# Patient Record
Sex: Male | Born: 1980
Health system: Southern US, Community
[De-identification: ages and names within clinical notes are randomized; demographics above are authoritative.]

## PROBLEM LIST (undated history)

## (undated) DIAGNOSIS — Z801 Family history of malignant neoplasm of trachea, bronchus and lung: Secondary | ICD-10-CM

## (undated) DIAGNOSIS — R569 Unspecified convulsions: Secondary | ICD-10-CM

## (undated) DIAGNOSIS — C189 Malignant neoplasm of colon, unspecified: Secondary | ICD-10-CM

## (undated) DIAGNOSIS — K4090 Unilateral inguinal hernia, without obstruction or gangrene, not specified as recurrent: Secondary | ICD-10-CM

## (undated) HISTORY — DX: Family history of malignant neoplasm of trachea, bronchus and lung: Z80.1

## (undated) HISTORY — PX: HERNIA REPAIR: SHX51

## (undated) HISTORY — DX: Malignant neoplasm of colon, unspecified: C18.9

## (undated) HISTORY — PX: OTHER SURGICAL HISTORY: SHX169

## (undated) HISTORY — PX: COLON SURGERY: SHX602

---

## 2004-03-01 HISTORY — PX: APPENDECTOMY: SHX54

## 2011-07-30 ENCOUNTER — Ambulatory Visit (INDEPENDENT_AMBULATORY_CARE_PROVIDER_SITE_OTHER): Payer: BC Managed Care – PPO | Admitting: General Surgery

## 2011-07-30 ENCOUNTER — Encounter (INDEPENDENT_AMBULATORY_CARE_PROVIDER_SITE_OTHER): Payer: Self-pay | Admitting: General Surgery

## 2011-07-30 VITALS — BP 138/60 | HR 84 | Temp 97.8°F | Resp 18 | Ht 72.0 in | Wt 195.0 lb

## 2011-07-30 DIAGNOSIS — K4090 Unilateral inguinal hernia, without obstruction or gangrene, not specified as recurrent: Secondary | ICD-10-CM | POA: Insufficient documentation

## 2011-07-30 NOTE — Progress Notes (Signed)
Patient ID: Andres Wallace, male   DOB: 1980-11-13, 31 y.o.   MRN: [id number]  No chief complaint on file.   HPI Andres Wallace is a 31 y.o. male.   HPI  He is referred by Dr. Vernie Ammons for further evaluation and treatment of a right inguinal hernia.  He has been having right groin pain for about a year. His primary care physician told him he had a small inguinal hernia. The pain has persisted and now he is unable to run significant distances because of the discomfort. He saw Dr. Vernie Ammons to arrange a vasectomy and was referred here to discuss repair of the right and one hernia at the same time. No constipation. No difficulty with urination. No chronic cough. No family history of hernia.  History reviewed. No pertinent past medical history.  Past Surgical History  Procedure Date  . Appendectomy 2006    Family History  Problem Relation Age of Onset  . Cancer Father     lung  . Cancer Paternal Grandfather     lung    Social History History  Substance Use Topics  . Smoking status: Never Smoker   . Smokeless tobacco: Not on file  . Alcohol Use: No    No Known Allergies  No current outpatient prescriptions on file.    Review of Systems Review of Systems  Constitutional: Negative.   Respiratory: Negative.   Cardiovascular: Negative.   Gastrointestinal: Negative.   Genitourinary: Negative for difficulty urinating.    Blood pressure 138/60, pulse 84, temperature 97.8 F (36.6 C), temperature source Temporal, resp. rate 18, height 6' (1.829 m), weight 195 lb (88.451 kg).  Physical Exam Physical Exam  Constitutional: He appears well-developed and well-nourished. No distress.  HENT:  Head: Normocephalic and atraumatic.  Cardiovascular: Normal rate and regular rhythm.   Pulmonary/Chest: Effort normal and breath sounds normal.  Abdominal: Soft. He exhibits no distension and no mass. There is no tenderness.  Genitourinary:       Reducible right inguinal bulge.  No left  inguinal bulge.  Musculoskeletal: Normal range of motion. He exhibits no edema.  Skin: Skin is warm and dry.    Data Reviewed Dr. Margrett Rud note.  Assessment    Symptomatic right inguinal hernia. He also wants to have a vasectomy at the same time.   Plan    I discussed open repair of a right inguinal hernia with mesh. He did have a vasectomy at the same time by Dr. Vernie Ammons.  I have explained the procedure, risks, and aftercare of inguinal hernia repair.  Risks include but are not limited to bleeding, infection, wound problems, anesthesia, recurrence, bladder or intestine injury, urinary retention, testicular dysfunction, chronic pain, mesh problems.  He seems to understand and agrees to proceed.  Will coordinate this with Dr. Vernie Ammons.

## 2011-07-30 NOTE — Patient Instructions (Signed)
Call us if the pain from the hernia becomes debilitating.

## 2011-08-02 ENCOUNTER — Other Ambulatory Visit: Payer: Self-pay | Admitting: Urology

## 2011-08-20 ENCOUNTER — Ambulatory Visit (HOSPITAL_BASED_OUTPATIENT_CLINIC_OR_DEPARTMENT_OTHER): Admission: RE | Admit: 2011-08-20 | Payer: BC Managed Care – PPO | Source: Ambulatory Visit | Admitting: General Surgery

## 2011-10-08 ENCOUNTER — Encounter (HOSPITAL_BASED_OUTPATIENT_CLINIC_OR_DEPARTMENT_OTHER): Payer: Self-pay | Admitting: *Deleted

## 2011-10-13 ENCOUNTER — Encounter (HOSPITAL_BASED_OUTPATIENT_CLINIC_OR_DEPARTMENT_OTHER): Payer: Self-pay | Admitting: *Deleted

## 2011-10-14 NOTE — H&P (Signed)
History of Present Illness         Andres Wallace is a 31 year old male patient who desires vasectomy.he is married and has 3 children. He is in good health with no significant past urologic  He has had prior local anesthetic without adverse reaction and has no history of bleeding diathesis.  He did however tell me that he has seen his primary care physician on 3 different occasions regarding pain in his groin region on the right-hand side. He was told he has a small hernia but no treatment was recommended. It bothers him to the point where he has difficulty engaging in activities such as running.   Past Medical History Problems  1. History of  No Medical Problems  Surgical History Problems  1. History of  Appendectomy  Current Meds 1. No Reported Medications  Allergies Medication  1. No Known Drug Allergies  Family History Problems  1. Family history of  Lung Cancer V16.1  Social History Problems    Being A Social Drinker 3-4 a month   Caffeine Use   Never A Smoker  Review of Systems Genitourinary, constitutional, skin, eye, otolaryngeal, hematologic/lymphatic, cardiovascular, pulmonary, endocrine, musculoskeletal, gastrointestinal, neurological and psychiatric system(s) were reviewed and pertinent findings if present are noted.  Genitourinary: inguinal pain and inguinal swelling.  ENT: sore throat and sinus problems.    Vitals Vital Signs  BMI Calculated: 26.41 BSA Calculated: 2.11 Height: 6 ft  Weight: 195 lb  Blood Pressure: 127 / 79 Heart Rate: 73  Physical Exam Constitutional: Well nourished and well developed . No acute distress.  ENT:. The ears and nose are normal in appearance.  Neck: The appearance of the neck is normal and no neck mass is present.  Pulmonary: No respiratory distress and normal respiratory rhythm and effort.  Cardiovascular: Heart rate and rhythm are normal . No peripheral edema.  Abdomen: The abdomen is soft and nontender. No masses are  palpated. No CVA tenderness. No hernias are palpable.  A right inguinal hernia is present, which is reducible. No hepatosplenomegaly noted.  Genitourinary: Examination of the penis demonstrates no discharge, no masses, no lesions and a normal meatus. The penis is circumcised. The scrotum is without lesions. The right vas deferens is is palpably normal. The left vas deferens is palpably normal. The right epididymis is palpably normal and non-tender. The left epididymis is palpably normal and non-tender. The right testis is non-tender and without masses. The left testis is non-tender and without masses.  Lymphatics: The femoral and inguinal nodes are not enlarged or tender.  Skin: Normal skin turgor, no visible rash and no visible skin lesions.  Neuro/Psych:. Mood and affect are appropriate.    Results/Data Urine  COLOR STRAW   APPEARANCE CLEAR   SPECIFIC GRAVITY <1.005   pH 5.5   GLUCOSE NEG mg/dL  BILIRUBIN NEG   KETONE NEG mg/dL  BLOOD NEG   PROTEIN NEG mg/dL  UROBILINOGEN 0.2 mg/dL  NITRITE NEG   LEUKOCYTE ESTERASE NEG    Assessment Assessed  1. Inquiry And Counseling: Contraceptive Practices V25.09 2. Direct Inguinal Hernia On The Right Side 550.90      We discussed the procedure today, going over the incision used and the fact that I use the no-scalpel, no-needle approach. We went over the pertinent anatomy of the scrotum and vasa. I have discussed with the patient the single midline incision that I use and the fact that the procedure is usually office-based and takes about 10 to 15 minutes. It however  can be performed at the same time as his inguinal hernia surgery in the hospital. We discussed the fact that the patient must absolutely be sure that he does not want to father a child under any circumstances and was counseled to talk with his partner regarding this decision. We then discussed the potential risks and complications of this procedure, including but not limited to  epididymitis with typically transient discomfort and swelling after the procedure, which can occur on one or both sides; sperm granuloma, which was described in detail as being caused by the presence of some sperm leaking out from the severed ends of the vasa, causing an inflammatory reaction and oftentimes resulting in a small round area of scar that may persist; hematoma and bleeding into the scrotum, which can be significant and possibly require a second incision to drain the accumulated blood, as well as how to prevent this by decreased activity level. We discussed superficial skin infection, which is rare, and abscess formation, which is exceedingly rare and may require drainage as well. Finally, we went over recanalization, which can occur in 1 in 2000 cases and how to determine this and prevent this from occurring. The patient was given written information regarding all of these risks and complications. Also, informed of preoperative preparation of shaving the scrotum, and given written information indicating that immediately after the procedure continued birth control needs to be used until 2 semen specimens are noted to be free of sperm.   Plan    1. He was scheduled for an appointment with Dr. Abbey Chatters for evaluation and treatment of his symptomatic right renal hernia. 2. I told him I would perform his vasectomy at the time of his inguinal hernia repair.

## 2011-10-15 ENCOUNTER — Ambulatory Visit (HOSPITAL_COMMUNITY): Payer: BC Managed Care – PPO | Admitting: Anesthesiology

## 2011-10-15 ENCOUNTER — Ambulatory Visit (HOSPITAL_BASED_OUTPATIENT_CLINIC_OR_DEPARTMENT_OTHER)
Admission: RE | Admit: 2011-10-15 | Discharge: 2011-10-15 | Disposition: A | Discharge status: Home / Self Care | Payer: BC Managed Care – PPO | Source: Ambulatory Visit | Attending: General Surgery | Admitting: General Surgery

## 2011-10-15 ENCOUNTER — Encounter (HOSPITAL_COMMUNITY): Payer: Self-pay

## 2011-10-15 ENCOUNTER — Encounter (HOSPITAL_COMMUNITY): Payer: Self-pay | Admitting: Anesthesiology

## 2011-10-15 ENCOUNTER — Encounter (HOSPITAL_COMMUNITY)
Admission: RE | Disposition: A | Discharge status: Home / Self Care | Payer: Self-pay | Source: Ambulatory Visit | Attending: General Surgery

## 2011-10-15 ENCOUNTER — Encounter (HOSPITAL_BASED_OUTPATIENT_CLINIC_OR_DEPARTMENT_OTHER): Admission: RE | Payer: Self-pay | Source: Ambulatory Visit

## 2011-10-15 DIAGNOSIS — K4090 Unilateral inguinal hernia, without obstruction or gangrene, not specified as recurrent: Secondary | ICD-10-CM | POA: Insufficient documentation

## 2011-10-15 DIAGNOSIS — Z302 Encounter for sterilization: Secondary | ICD-10-CM | POA: Insufficient documentation

## 2011-10-15 HISTORY — PX: INGUINAL HERNIA REPAIR: SHX194

## 2011-10-15 HISTORY — DX: Unilateral inguinal hernia, without obstruction or gangrene, not specified as recurrent: K40.90

## 2011-10-15 HISTORY — PX: VASECTOMY: SHX75

## 2011-10-15 SURGERY — REPAIR, HERNIA, INGUINAL, ADULT
Anesthesia: General | Laterality: Right

## 2011-10-15 SURGERY — REPAIR, HERNIA, INGUINAL, ADULT
Anesthesia: General | Site: Groin | Laterality: Right | Wound class: Clean

## 2011-10-15 MED ORDER — DEXTROSE 5 % IV SOLN
2.0000 g | INTRAVENOUS | Status: AC
  Administered 2011-10-15: 2 g via INTRAVENOUS
  Filled 2011-10-15: qty 20

## 2011-10-15 MED ORDER — LACTATED RINGERS IV SOLN
INTRAVENOUS | Status: DC | PRN
  Administered 2011-10-15: 07:00:00 via INTRAVENOUS

## 2011-10-15 MED ORDER — FENTANYL CITRATE 0.05 MG/ML IJ SOLN
INTRAMUSCULAR | Status: DC | PRN
  Administered 2011-10-15 (×5): 25 ug via INTRAVENOUS

## 2011-10-15 MED ORDER — KETOROLAC TROMETHAMINE 30 MG/ML IJ SOLN
INTRAMUSCULAR | Status: AC
  Filled 2011-10-15: qty 1

## 2011-10-15 MED ORDER — CEFAZOLIN SODIUM-DEXTROSE 2-3 GM-% IV SOLR
INTRAVENOUS | Status: AC
  Filled 2011-10-15: qty 50

## 2011-10-15 MED ORDER — ACETAMINOPHEN 10 MG/ML IV SOLN
INTRAVENOUS | Status: DC | PRN
  Administered 2011-10-15: 1000 mg via INTRAVENOUS

## 2011-10-15 MED ORDER — ACETAMINOPHEN 325 MG PO TABS: 650.0000 mg | ORAL_TABLET | ORAL | Status: DC | PRN

## 2011-10-15 MED ORDER — MIDAZOLAM HCL 5 MG/5ML IJ SOLN
INTRAMUSCULAR | Status: DC | PRN
  Administered 2011-10-15: 2 mg via INTRAVENOUS

## 2011-10-15 MED ORDER — KETOROLAC TROMETHAMINE 30 MG/ML IJ SOLN
15.0000 mg | Freq: Once | INTRAMUSCULAR | Status: AC | PRN
  Administered 2011-10-15: 30 mg via INTRAVENOUS

## 2011-10-15 MED ORDER — OXYCODONE-ACETAMINOPHEN 5-325 MG PO TABS: 1.0000 | ORAL_TABLET | ORAL | Status: AC | PRN

## 2011-10-15 MED ORDER — HYDROMORPHONE HCL PF 1 MG/ML IJ SOLN
0.2500 mg | INTRAMUSCULAR | Status: DC | PRN
  Administered 2011-10-15: 0.5 mg via INTRAVENOUS

## 2011-10-15 MED ORDER — BUPIVACAINE HCL (PF) 0.5 % IJ SOLN
INTRAMUSCULAR | Status: AC
  Filled 2011-10-15: qty 30

## 2011-10-15 MED ORDER — ACETAMINOPHEN 650 MG RE SUPP
650.0000 mg | RECTAL | Status: DC | PRN
  Filled 2011-10-15: qty 1

## 2011-10-15 MED ORDER — MORPHINE SULFATE 10 MG/ML IJ SOLN: 2.0000 mg | INTRAMUSCULAR | Status: DC | PRN

## 2011-10-15 MED ORDER — MUPIROCIN 2 % EX OINT
TOPICAL_OINTMENT | Freq: Two times a day (BID) | CUTANEOUS | Status: DC
  Administered 2011-10-15: 06:00:00 via NASAL
  Filled 2011-10-15: qty 22

## 2011-10-15 MED ORDER — ACETAMINOPHEN 10 MG/ML IV SOLN
INTRAVENOUS | Status: AC
  Filled 2011-10-15: qty 100

## 2011-10-15 MED ORDER — HYDROMORPHONE HCL PF 1 MG/ML IJ SOLN
INTRAMUSCULAR | Status: AC
  Filled 2011-10-15: qty 1

## 2011-10-15 MED ORDER — SODIUM CHLORIDE 0.9 % IJ SOLN: 3.0000 mL | INTRAMUSCULAR | Status: DC | PRN

## 2011-10-15 MED ORDER — BUPIVACAINE-EPINEPHRINE 0.5% -1:200000 IJ SOLN
INTRAMUSCULAR | Status: DC | PRN
  Administered 2011-10-15: 24 mL
  Administered 2011-10-15: 2 mL

## 2011-10-15 MED ORDER — LACTATED RINGERS IV SOLN
INTRAVENOUS | Status: DC
  Administered 2011-10-15: 13:00:00 via INTRAVENOUS

## 2011-10-15 MED ORDER — OXYCODONE HCL 5 MG PO TABS
5.0000 mg | ORAL_TABLET | ORAL | Status: DC | PRN
  Administered 2011-10-15: 5 mg via ORAL
  Filled 2011-10-15: qty 1

## 2011-10-15 MED ORDER — ONDANSETRON HCL 4 MG/2ML IJ SOLN: 4.0000 mg | Freq: Four times a day (QID) | INTRAMUSCULAR | Status: DC | PRN

## 2011-10-15 MED ORDER — PROMETHAZINE HCL 25 MG/ML IJ SOLN: 6.2500 mg | INTRAMUSCULAR | Status: DC | PRN

## 2011-10-15 SURGICAL SUPPLY — 60 items
BENZOIN TINCTURE PRP APPL 2/3 (GAUZE/BANDAGES/DRESSINGS) ×3 IMPLANT
BLADE HEX COATED 2.75 (ELECTRODE) ×3 IMPLANT
BLADE SURG 15 STRL LF DISP TIS (BLADE) IMPLANT
BLADE SURG 15 STRL SS (BLADE)
BLADE SURG ROTATE 9660 (MISCELLANEOUS) ×3 IMPLANT
BLADE SURG SZ10 CARB STEEL (BLADE) ×3 IMPLANT
CANISTER SUCTION 2500CC (MISCELLANEOUS) ×3 IMPLANT
CATH FOLEY 2WAY SLVR  5CC 30FR (CATHETERS) ×1
CATH FOLEY 2WAY SLVR 5CC 30FR (CATHETERS) ×2 IMPLANT
CLOTH BEACON ORANGE TIMEOUT ST (SAFETY) ×3 IMPLANT
COVER MAYO STAND STRL (DRAPES) ×3 IMPLANT
DECANTER SPIKE VIAL GLASS SM (MISCELLANEOUS) ×3 IMPLANT
DRAIN PENROSE 18X1/2 LTX STRL (DRAIN) ×3 IMPLANT
DRAPE BACK TABLE (DRAPES) IMPLANT
DRAPE INCISE IOBAN 66X45 STRL (DRAPES) ×3 IMPLANT
DRAPE LAPAROSCOPIC ABDOMINAL (DRAPES) ×3 IMPLANT
DRAPE LAPAROTOMY TRNSV 102X78 (DRAPE) IMPLANT
DRAPE PED LAPAROTOMY (DRAPES) ×3 IMPLANT
DRAPE TABLE BACK 44X90 PK DISP (DRAPES) ×3 IMPLANT
DRAPE UTILITY XL STRL (DRAPES) ×3 IMPLANT
DRESSING TELFA 8X3 (GAUZE/BANDAGES/DRESSINGS) IMPLANT
DRSG TEGADERM 2-3/8X2-3/4 SM (GAUZE/BANDAGES/DRESSINGS) IMPLANT
DRSG TEGADERM 4X4.75 (GAUZE/BANDAGES/DRESSINGS) ×3 IMPLANT
ELECT NEEDLE TIP 2.8 STRL (NEEDLE) ×3 IMPLANT
ELECT REM PT RETURN 9FT ADLT (ELECTROSURGICAL) ×3
ELECTRODE REM PT RTRN 9FT ADLT (ELECTROSURGICAL) ×2 IMPLANT
GAUZE SPONGE 4X4 16PLY XRAY LF (GAUZE/BANDAGES/DRESSINGS) ×3 IMPLANT
GLOVE BIOGEL M 8.0 STRL (GLOVE) ×3 IMPLANT
GLOVE ECLIPSE 8.0 STRL XLNG CF (GLOVE) ×3 IMPLANT
GLOVE INDICATOR 8.0 STRL GRN (GLOVE) ×6 IMPLANT
GOWN PREVENTION PLUS XLARGE (GOWN DISPOSABLE) ×3 IMPLANT
GOWN STRL NON-REIN LRG LVL3 (GOWN DISPOSABLE) IMPLANT
GOWN STRL REIN XL XLG (GOWN DISPOSABLE) ×9 IMPLANT
KIT BASIN OR (CUSTOM PROCEDURE TRAY) ×6 IMPLANT
MESH BARD SOFT 3X6IN (Mesh General) ×3 IMPLANT
NEEDLE 27GAX1X1/2 (NEEDLE) ×3 IMPLANT
NEEDLE HYPO 25X1 1.5 SAFETY (NEEDLE) ×3 IMPLANT
NS IRRIG 1000ML POUR BTL (IV SOLUTION) ×3 IMPLANT
PACK BASIC VI WITH GOWN DISP (CUSTOM PROCEDURE TRAY) ×3 IMPLANT
PENCIL BUTTON HOLSTER BLD 10FT (ELECTRODE) ×3 IMPLANT
PUMP ON Q 100MLX2ML/HR (PAIN MANAGEMENT) IMPLANT
SPONGE GAUZE 4X4 12PLY (GAUZE/BANDAGES/DRESSINGS) ×3 IMPLANT
SPONGE LAP 4X18 X RAY DECT (DISPOSABLE) ×3 IMPLANT
STRIP CLOSURE SKIN 1/2X4 (GAUZE/BANDAGES/DRESSINGS) ×3 IMPLANT
SUPPORT SCROTAL LG STRP (MISCELLANEOUS) ×3 IMPLANT
SUPPORT SCROTAL MED ADLT STRP (MISCELLANEOUS) ×3 IMPLANT
SUT CHROMIC 3 0 PS 2 (SUTURE) ×3 IMPLANT
SUT MNCRL AB 4-0 PS2 18 (SUTURE) ×3 IMPLANT
SUT PROLENE 2 0 CT2 30 (SUTURE) ×6 IMPLANT
SUT SILK 2 0 (SUTURE) ×1
SUT SILK 2-0 18XBRD TIE 12 (SUTURE) ×2 IMPLANT
SUT VIC AB 2-0 SH 18 (SUTURE) ×3 IMPLANT
SUT VIC AB 3-0 54XBRD REEL (SUTURE) ×2 IMPLANT
SUT VIC AB 3-0 BRD 54 (SUTURE) ×1
SUT VIC AB 3-0 SH 27 (SUTURE) ×1
SUT VIC AB 3-0 SH 27XBRD (SUTURE) ×2 IMPLANT
SYR BULB IRRIGATION 50ML (SYRINGE) ×3 IMPLANT
SYR CONTROL 10ML LL (SYRINGE) ×3 IMPLANT
TOWEL OR 17X26 10 PK STRL BLUE (TOWEL DISPOSABLE) ×3 IMPLANT
YANKAUER SUCT BULB TIP 10FT TU (MISCELLANEOUS) ×3 IMPLANT

## 2011-10-15 NOTE — Anesthesia Preprocedure Evaluation (Signed)
Anesthesia Evaluation  Patient identified by MRN, date of birth, ID band Patient awake    Reviewed: Allergy & Precautions, H&P , NPO status , Patient's Chart, lab work & pertinent test results  Airway Mallampati: II TM Distance: >3 FB Neck ROM: Full    Dental No notable dental hx. (+) Dental Advisory Given   Pulmonary neg pulmonary ROS,  breath sounds clear to auscultation  Pulmonary exam normal       Cardiovascular negative cardio ROS  Rhythm:Regular Rate:Normal     Neuro/Psych negative neurological ROS  negative psych ROS   GI/Hepatic negative GI ROS, Neg liver ROS,   Endo/Other  negative endocrine ROS  Renal/GU negative Renal ROS  negative genitourinary   Musculoskeletal negative musculoskeletal ROS (+)   Abdominal   Peds negative pediatric ROS (+)  Hematology negative hematology ROS (+)   Anesthesia Other Findings   Reproductive/Obstetrics negative OB ROS                           Anesthesia Physical Anesthesia Plan  ASA: I  Anesthesia Plan: General   Post-op Pain Management:    Induction: Intravenous  Airway Management Planned: Oral ETT and LMA  Additional Equipment:   Intra-op Plan:   Post-operative Plan: Extubation in OR  Informed Consent: I have reviewed the patients History and Physical, chart, labs and discussed the procedure including the risks, benefits and alternatives for the proposed anesthesia with the patient or authorized representative who has indicated his/her understanding and acceptance.   Dental advisory given  Plan Discussed with: CRNA and Surgeon  Anesthesia Plan Comments:         Anesthesia Quick Evaluation

## 2011-10-15 NOTE — Progress Notes (Signed)
Patient standing at side of stretcher. States dizziness comes and goes. Laid back down to rest. Does not feel need to urinate.

## 2011-10-15 NOTE — Progress Notes (Signed)
Attempted to walk pt to BR.Patient became dizzy at bedside. Sat him back on stretcher.

## 2011-10-15 NOTE — Anesthesia Postprocedure Evaluation (Signed)
  Anesthesia Post-op Note  Patient: Andres Wallace. [name]  Procedure(s) Performed: Procedure(s) (LRB): HERNIA REPAIR INGUINAL ADULT (Right) INSERTION OF MESH (N/A) VASECTOMY (N/A)  Patient Location: PACU  Anesthesia Type: General  Level of Consciousness: awake and alert   Airway and Oxygen Therapy: Patient Spontanous Breathing  Post-op Pain: mild  Post-op Assessment: Post-op Vital signs reviewed, Patient's Cardiovascular Status Stable, Respiratory Function Stable, Patent Airway and No signs of Nausea or vomiting  Post-op Vital Signs: stable  Complications: No apparent anesthesia complications

## 2011-10-15 NOTE — Transfer of Care (Signed)
Immediate Anesthesia Transfer of Care Note  Patient: Andres Wallace. Gencarelli  Procedure(s) Performed: Procedure(s) (LRB): HERNIA REPAIR INGUINAL ADULT (Right) INSERTION OF MESH (N/A) VASECTOMY (N/A)  Patient Location: PACU  Anesthesia Type: General  Level of Consciousness: sedated, patient cooperative and responds to stimulaton  Airway & Oxygen Therapy: Patient Spontanous Breathing and Patient connected to face mask oxgen  Post-op Assessment: Report given to PACU RN and Post -op Vital signs reviewed and stable  Post vital signs: Reviewed and stable  Complications: No apparent anesthesia complications

## 2011-10-15 NOTE — Op Note (Signed)
Preoperative diagnosis:  Right inguinal hernia.  Postoperative diagnosis:  Same  Procedure:  Right inguinal hernia repair with mesh.  Surgeon:  Avel Peace, M.D.  Asst:  Ihor Gully, M.D.  Anesthesia:  General/LMA with local (Marcaine).  Indication:  This is a 31 year old male with a painful right inguinal hernia who presents for repair.  He also is going to have a vasectomy by Dr. Vernie Ammons.  The procedure, risks, and aftercare have been explained preop.  Technique:  He was seen in the holding room and the right groin was marked with my initials. The patient was brought to the operating, placed supine on the operating table, and the anesthetic was administered by the anesthesiologist. The hair in the groin area was clipped as was felt to be necessary. This area was then sterilely prepped and draped.  Local anesthetic was infiltrated in the superficial and deep tissues in the right groin.  An incision was made through the skin and subcutaneous tissue until the external oblique aponeurosis was identified.  Local anesthetic was infiltrated deep to the external oblique aponeurosis. The external oblique aponeurosis was divided through the external ring medially and back toward the anterior superior iliac spine laterally. Using blunt dissection, the shelving edge of the inguinal ligament was identified inferiorly and the internal oblique aponeurosis and muscle were identified superiorly. The ilioinguinal nerve was identified and preserved.  The spermatic cord was isolated and a posterior window was made around it. A direct hernia  was identified and separated from the spermatic cord using blunt dissection. The hernia sac and its contents were reduced through the direct hernia defect and the defect was closed with 2-0 Vicryl suture.   A piece of 3" x 6" soft polypropylene mesh was brought into the field and anchored 1-2 cm medial to the pubic tubercle with 2-0 Prolene suture. The inferior aspect of  the mesh was anchored to the shelving edge of the inguinal ligament with running 2-0 Prolene suture to a level 1-2 cm lateral to the internal ring. A slit was cut in the mesh creating 2 tails. These were wrapped around the spermatic cord. The superior aspect of the mesh was anchored to the internal oblique aponeurosis and muscle with interrupted 2-0 Vicryl sutures.  A gap in the mesh medial to the cord was closed with 2-0 Prolene suture. The 2 tails of the mesh were then crossed creating a new internal ring and were anchored to the shelving edge of the inguinal ligament with 2-0 Prolene suture. The tip of a hemostat could be placed through the new aperture. The lateral aspect of the mesh was then tucked deep to the external oblique aponeurosis.  The wound was inspected and hemostasis was adequate. The external oblique aponeurosis was then closed over the mesh and cord with running 3-0 Vicryl suture. The subcutaneous tissue was closed with running 3-0 Vicryl suture. The skin closed with a running 4-0 Monocryl subcuticular stitch.  Steri-Strips and a sterile dressing were applied.  Dr. Vernie Ammons then performed the vasectomy.  The procedures were well-tolerated without any apparent complications and the patient was taken to the recovery room in satisfactory condition.

## 2011-10-15 NOTE — Op Note (Signed)
PATIENT:  Andres Wallace. Godley  PRE-OPERATIVE DIAGNOSIS: Desires sterility  POST-OPERATIVE DIAGNOSIS:  Same  PROCEDURE:  Procedure(s): Bilateral scrotal vasectomy  SURGEON:  Garnett Farm  INDICATION: Mr. Ventrella is a 31 year old male who desires a vasectomy. He is married and has children. We have discussed the procedure, its risks and complications and alternatives. He understands and elected to proceed.  ANESTHESIA:  General  EBL:  None  LOCAL MEDICATIONS USED:  1/2% pain Marcaine  Description of procedure: The patient has reviewed information regarding the risks and complications and understands the advantages and disadvantages of elective sterilization. With full informed written and oral consent, he has elected to proceed with vasectomy. The patient was placed in the supine position, and the genitalia was sterilely prepped with betadine and draped;  The right vas was then grasped through the skin using the non-piercing vas clamp, the skin was pierced with a single blade of the sharpened hemostat. The sharpened hemostat was then placed through this incision, and the perivasal tissue cleared from around the vas itself. The vas was then delivered through the wound and clamped with a vas clamp, a segment of approximately 1 cm of vasal tissue was cleared, and a slik suture was then passed beneath the vas. The silk suture was then tied proximally, and a second one was tied distally, isolating the approximately 1 cm segment of vas. I then excised the segment of vas and cauterized the proximal and distal lumens with the eye cautery. The excess suture was then cut, and the vas was allowed to drop back into the scrotum after observation for any bleeding. The contralateral vas was then treated in an identical fashion. Any further bleeding points were cauterized. The wound was then observed for any bleeding, and none was noted. The patient tolerated the procedure well and without complications. A sterile  gauze dressing was applied as well as a form of scrotal supporting clothing. He was sent home with instructions regarding wound care and signs of infection, and recommended to restrict activity for 48 hours. He was informed he should not consider himself sterile until 2 subsequent semen analyses have been noted to be completely free of sperm.   PLAN OF CARE: Discharge to home after PACU  PATIENT DISPOSITION:  PACU - hemodynamically stable.

## 2011-10-18 ENCOUNTER — Encounter (HOSPITAL_COMMUNITY): Payer: Self-pay | Admitting: General Surgery

## 2011-11-22 ENCOUNTER — Ambulatory Visit (INDEPENDENT_AMBULATORY_CARE_PROVIDER_SITE_OTHER): Payer: BC Managed Care – PPO | Admitting: General Surgery

## 2011-11-22 DIAGNOSIS — Z9889 Other specified postprocedural states: Secondary | ICD-10-CM

## 2011-11-22 NOTE — Progress Notes (Signed)
Operation:   Right inguinal hernia repair with mesh  Date: October 14, 2028  Pathology: na  HPI:  He is here for his first postoperative visit. He has little bit of soreness lateral to the incision when he twists. Otherwise is doing well.   Physical Exam: GU-right groin incision is clean and intact; hernia repair is solid.  Assessment:  Recovering well after open right inguinal hernia repair with mesh  Plan: Activities as tolerated and we discussed with his meds. We also discussed the possibility of having chronic intermittent pain. Return visit as needed.

## 2011-11-22 NOTE — Patient Instructions (Signed)
Activities as tolerated as we discussed. 

## 2014-04-03 DIAGNOSIS — R569 Unspecified convulsions: Secondary | ICD-10-CM | POA: Insufficient documentation

## 2014-07-09 DIAGNOSIS — G40219 Localization-related (focal) (partial) symptomatic epilepsy and epileptic syndromes with complex partial seizures, intractable, without status epilepticus: Secondary | ICD-10-CM | POA: Insufficient documentation

## 2016-10-05 ENCOUNTER — Other Ambulatory Visit: Payer: Self-pay | Admitting: General Surgery

## 2016-10-05 NOTE — H&P (Signed)
History of Present Illness Andres Ruff MD; 0/04/5007 12:29 PM) The patient is a 36 year old male who presents with colorectal cancer. 36 year old male who presented to the office for a newly diagnosed colon cancer. He has had 1-2 months of rectal bleeding. He also developed some crampy abdominal pain several weeks ago. He was seen by his gastroenterologist and colonoscopy was performed. This showed a mass in his sigmoid or descending colon. Biopsy showed invasive adenocarcinoma. CEA level was 1.7. CT scan of the abdomen and pelvis was done yesterday. This showed no signs of metastatic disease but several mesenteric lymph nodes were enlarged. Patient is now asymptomatic.   Past Surgical History Malachy Moan, [place]; 10/05/2016 9:54 AM) Appendectomy Open Inguinal Hernia Surgery Right. Vasectomy  Diagnostic Studies History Malachy Moan, [place]; 10/05/2016 9:54 AM) Colonoscopy within last year  Allergies Malachy Moan, RMA; 10/05/2016 9:54 AM) No Known Allergies 10/05/2016  Medication History Malachy Moan, RMA; 10/05/2016 9:55 AM) LamoTRIgine ER (200MG  Tablet ER 24HR, Oral) Active. Medications Reconciled  Social History Malachy Moan, [place]; 10/05/2016 9:54 AM) Alcohol use Occasional alcohol use. Caffeine use Carbonated beverages. No drug use Tobacco use Never smoker.  Family History Malachy Moan, [place]; 10/05/2016 9:54 AM) Respiratory Condition Father.  Other Problems Malachy Moan, [place]; 10/05/2016 9:54 AM) Colon Cancer Inguinal Hernia Seizure Disorder     Review of Systems Malachy Moan RMA; 10/05/2016 9:54 AM) General Not Present- Appetite Loss, Chills, Fatigue, Fever, Night Sweats, Weight Gain and Weight Loss. Skin Not Present- Change in Wart/Mole, Dryness, Hives, Jaundice, New Lesions, Non-Healing Wounds, Rash and Ulcer. HEENT Not Present- Earache, Hearing Loss, Hoarseness, Nose Bleed, Oral Ulcers, Ringing in the Ears, Seasonal Allergies,  Sinus Pain, Sore Throat, Visual Disturbances, Wears glasses/contact lenses and Yellow Eyes. Respiratory Not Present- Bloody sputum, Chronic Cough, Difficulty Breathing, Snoring and Wheezing. Breast Not Present- Breast Mass, Breast Pain, Nipple Discharge and Skin Changes. Cardiovascular Not Present- Chest Pain, Difficulty Breathing Lying Down, Leg Cramps, Palpitations, Rapid Heart Rate, Shortness of Breath and Swelling of Extremities. Gastrointestinal Present- Abdominal Pain and Bloody Stool. Not Present- Bloating, Change in Bowel Habits, Chronic diarrhea, Constipation, Difficulty Swallowing, Excessive gas, Gets full quickly at meals, Hemorrhoids, Indigestion, Nausea, Rectal Pain and Vomiting. Male Genitourinary Not Present- Blood in Urine, Change in Urinary Stream, Frequency, Impotence, Nocturia, Painful Urination, Urgency and Urine Leakage. Musculoskeletal Not Present- Back Pain, Joint Pain, Joint Stiffness, Muscle Pain, Muscle Weakness and Swelling of Extremities. Neurological Not Present- Decreased Memory, Fainting, Headaches, Numbness, Seizures, Tingling, Tremor, Trouble walking and Weakness. Psychiatric Not Present- Anxiety, Bipolar, Change in Sleep Pattern, Depression, Fearful and Frequent crying. Endocrine Not Present- Cold Intolerance, Excessive Hunger, Hair Changes, Heat Intolerance, Hot flashes and New Diabetes. Hematology Not Present- Blood Thinners, Easy Bruising, Excessive bleeding, Gland problems, HIV and Persistent Infections.  Vitals Malachy Moan RMA; 10/05/2016 9:55 AM) 10/05/2016 9:55 AM Weight: 181.4 lb Height: 72in Body Surface Area: 2.04 m Body Mass Index: 24.6 kg/m  Temp.: 98.46F  Pulse: 81 (Regular)  BP: 110/64 (Sitting, Left Arm, Standard)      Physical Exam Andres Ruff MD; 05/07/1827 10:38 AM)  General Mental Status-Alert. General Appearance-Not in acute distress. Build & Nutrition-Well nourished. Posture-Normal  posture. Gait-Normal.  Head and Neck Head-normocephalic, atraumatic with no lesions or palpable masses. Trachea-midline.  Chest and Lung Exam Chest and lung exam reveals -on auscultation, normal breath sounds, no adventitious sounds and normal vocal resonance.  Cardiovascular Cardiovascular examination reveals -normal heart sounds, regular rate and rhythm with no murmurs and no digital clubbing, cyanosis,  edema, increased warmth or tenderness.  Abdomen Inspection Inspection of the abdomen reveals - No Hernias. Palpation/Percussion Palpation and Percussion of the abdomen reveal - Soft, Non Tender, No Rigidity (guarding) and No hepatosplenomegaly. Abdominal Mass Palpable - Location - Left Lower Quadrant.  Neurologic Neurologic evaluation reveals -alert and oriented x 3 with no impairment of recent or remote memory, normal attention span and ability to concentrate, normal sensation and normal coordination.  Musculoskeletal Normal Exam - Bilateral-Upper Extremity Strength Normal and Lower Extremity Strength Normal.    Assessment & Plan Andres Ruff MD; 5/0/0370 12:30 PM)  CANCER OF DESCENDING COLON (C18.6) Impression: 36 year old male who presents to the office with a left-sided colon cancer which was newly diagnosed last Friday. There is currently no signs of metastatic disease. I have recommended a left hemicolectomy. We will try to do this in a minimally invasive fashion. I recommended that he use MiraLAX and a low residue diet in the meantime to help avoid any obstructive symptoms. We will refer him to the genetics office due to his young age. We will also order a CT scan of the chest to complete his metastatic workup. The surgery and anatomy were described to the patient as well as the risks of surgery and the possible complications. These include: Bleeding, deep abdominal infections and possible wound complications such as hernia and infection, damage to adjacent  structures, leak of surgical connections, which can lead to other surgeries and possibly an ostomy, possible need for other procedures, such as abscess drains in radiology, possible prolonged hospital stay, possible diarrhea from removal of part of the colon, possible constipation from narcotics, possible bowel, bladder or sexual dysfunction if having rectal surgery, prolonged fatigue/weakness or appetite loss, possible early recurrence of of disease, possible complications of their medical problems such as heart disease or arrhythmias or lung problems, death (less than 1%). I believe the patient understands and wishes to proceed with the surgery.

## 2016-10-06 ENCOUNTER — Other Ambulatory Visit: Payer: Self-pay | Admitting: General Surgery

## 2016-10-06 DIAGNOSIS — C186 Malignant neoplasm of descending colon: Secondary | ICD-10-CM

## 2016-10-07 ENCOUNTER — Encounter: Payer: Self-pay | Admitting: Genetics

## 2016-10-07 ENCOUNTER — Telehealth: Payer: Self-pay | Admitting: Genetics

## 2016-10-07 NOTE — Telephone Encounter (Signed)
Pt has been scheduled for genetic counseling on 8/14 at 1pm. Pt aware to arrive 30 minutes early. Address and insurance verified. Letter mailed to the pt.

## 2016-10-11 ENCOUNTER — Encounter: Payer: Self-pay | Admitting: Genetics

## 2016-10-12 ENCOUNTER — Encounter: Payer: Self-pay | Admitting: Genetics

## 2016-10-12 ENCOUNTER — Other Ambulatory Visit: Payer: Self-pay

## 2016-10-12 ENCOUNTER — Ambulatory Visit (HOSPITAL_BASED_OUTPATIENT_CLINIC_OR_DEPARTMENT_OTHER): Payer: Self-pay | Admitting: Genetics

## 2016-10-12 DIAGNOSIS — C189 Malignant neoplasm of colon, unspecified: Secondary | ICD-10-CM

## 2016-10-12 DIAGNOSIS — C186 Malignant neoplasm of descending colon: Secondary | ICD-10-CM

## 2016-10-12 DIAGNOSIS — C772 Secondary and unspecified malignant neoplasm of intra-abdominal lymph nodes: Secondary | ICD-10-CM | POA: Insufficient documentation

## 2016-10-12 DIAGNOSIS — Z809 Family history of malignant neoplasm, unspecified: Secondary | ICD-10-CM

## 2016-10-12 DIAGNOSIS — C187 Malignant neoplasm of sigmoid colon: Secondary | ICD-10-CM | POA: Insufficient documentation

## 2016-10-12 DIAGNOSIS — Z801 Family history of malignant neoplasm of trachea, bronchus and lung: Secondary | ICD-10-CM | POA: Insufficient documentation

## 2016-10-12 DIAGNOSIS — Z315 Encounter for genetic counseling: Secondary | ICD-10-CM

## 2016-10-12 NOTE — Progress Notes (Addendum)
REFERRING PROVIDER: Leighton Ruff, MD [address] [place], [place] [address]  PRIMARY PROVIDER:  Carlus Pavlov, MD  PRIMARY REASON FOR VISIT:  1. Malignant neoplasm of colon, unspecified part of colon ([place])   2. Family history of lung cancer    HISTORY OF PRESENT ILLNESS:   Andres Wallace, a 36 y.o. male, was seen for a [place] [facility] cancer genetics consultation at the request of Dr. Marcello Moores due to a personal history of cancer.  Andres Wallace presents to clinic today to discuss the possibility of a hereditary predisposition to cancer, genetic testing, and to further clarify his future cancer risks, as well as potential cancer risks for family members.   In August 2018, at the age of 26, Andres Wallace was diagnosed with invasive adenocarcinoma of the left sided colon. He plans to have a partial colectomy on 11/10/2016.    HORMONAL RISK FACTORS:  Colonoscopy: yes; abnormal. Revealed invasive adenocarcinoma.  Past Medical History:  Diagnosis Date  . Colon cancer ([place])   . Family history of lung cancer   . Right inguinal hernia     Past Surgical History:  Procedure Laterality Date  . APPENDECTOMY  2006  . INGUINAL HERNIA REPAIR  10/15/2011   Procedure: HERNIA REPAIR INGUINAL ADULT;  Surgeon: Odis Hollingshead, MD;  Location: [facility];  Service: General;  Laterality: Right;  with mesh   . VASECTOMY  10/15/2011   Procedure: VASECTOMY;  Surgeon: Claybon Jabs, MD;  Location: [facility];  Service: Urology;  Laterality: N/A;    Social History   Social History  . Marital status: Married    Spouse name: N/A  . Number of children: N/A  . Years of education: N/A   Social History Main Topics  . Smoking status: Never Smoker  . Smokeless tobacco: Never Used  . Alcohol use No  . Drug use: No  . Sexual activity: Not Asked   Other Topics Concern  . None   Social History Narrative  . None     FAMILY HISTORY:  We obtained a detailed, 4-generation family history.  Significant diagnoses are  listed below:  Family History  Problem Relation Age of Onset  . Lung cancer Father 76  . Lung cancer Paternal Grandfather        dx in 27's, died at 35  . Heart attack Maternal Grandmother 58   [name] has an 48 year-old son, a 40 year-old daughter, and a 55 year-old daughter who are all healthy.  Andres Wallace has a 14 year-old sister who is 26 with no history of cancer.  This sister has 2 sons (61yo,4yo) and a daughter (2yo)withy no history of cancer.  Andres Wallace's father died from lung cancer at 84.  He had a history of smoking. Andres Wallace has a paternal uncle who is 38 with no history of cancer.  This uncle has 5 children in their 20's with no history of cancer. Andres Wallace has a paternal aunt who is 1 with no history of cancer.  She has 2 daughters and a son who are in their 39's and 86's with no history of cancer.  Andres Wallace's paternal grandfather was diagnosed with lung cancer in his 38's and died at 48.  He had a history of smoking.  This grandfather's mother lived to 21 without caner and his mother died at 54 with no history of cancer. Andres Wallace's paternal grandmother is 74 with no history of cancer. No information is known about his  family history.   Andres Wallace mother is 85 with no history of cancer. She had a hysterectomy in her late 58's and has not had a colonoscopy.  Andres Wallace has 2 paternal aunts and 1 paternal uncle listed below: -1 paternal aunt  is in her 46's with no history of cancer.  She has 3 children in their 78's with no history of cancer.  -1 paternal is in her 53's with no history of cancer.  She has 3 children in their 18's with no history of cancer.  -1 paternal uncle is 93 with no history of cancer.  He has 3 children in their 40's with no history of cancer.  Andres Wallace's maternal grandfather died in his 11's, and the cause is unknown.  There is no known cancer on his side of the family.   Andres Wallace's maternal grandmother died at 70 from a heart attack. There is no known  history of cancer on her side of the family.    Andres Wallace is unaware of previous family history of genetic testing for hereditary cancer risks. Patient's maternal ancestors are of [place] and Native American descent, and paternal ancestors are of Northern European descent. There is no reported Ashkenazi Jewish ancestry. There is no known consanguinity.  GENETIC COUNSELING ASSESSMENT: Andres Wallace is a 36 y.o. 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 We, therefore, discussed and recommended the following at today's visit.   DISCUSSION: We reviewed the characteristics, features and inheritance patterns of hereditary cancer syndromes. We also discussed genetic testing, including the appropriate family members to test, the process of testing, insurance coverage and turn-around-time for results. We discussed the implications of a negative, positive and/or variant of uncertain significant result. We recommended Andres Wallace pursue genetic testing for the Common Hereditary Cancers gene panel.  The Hereditary Gene Panel offered by [facility] includes sequencing and/or deletion duplication testing of the following 46 genes: APC, ATM, AXIN2, BARD1, BMPR1A, BRCA1, BRCA2, BRIP1, CDH1, CDKN2A (p14ARF), CDKN2A (p16INK4a), CHEK2, CTNNA1, DICER1, EPCAM (Deletion/duplication testing only), GREM1 (promoter region deletion/duplication testing only), KIT, MEN1, MLH1, MSH2, MSH3, MSH6, MUTYH, NBN, NF1, NHTL1, PALB2, PDGFRA, PMS2, POLD1, POLE, PTEN, RAD50, RAD51C, RAD51D, SDHB, SDHC, SDHD, SMAD4, SMARCA4. STK11, TP53, TSC1, TSC2, and VHL.  The following genes were evaluated for sequence changes only: SDHA and HOXB13 c.251G>A variant only.  We discussed that only 5-10% of cancers are associated with a Hereditary Cancer Predisposition Syndrome.  The most common hereditary cancer syndrome associated with colon cancer is Lynch Syndrome.  Lynch Syndrome  is caused by mutations in the genes: MLH1, MSH2, MSH6, PMS2 and EPCAM.  This syndrome increases the risk for colon, uterine, ovarian and stomach cancers, as well as others. We discussed that there are several other genes that are associated with an increased risk for colon cancer and increased polyp burden (MUTYH, APC, CHEK2, etc.) We also dicussed that there are many other genes that cause many different types of cancer risks.    We discussed that if he is found to have a mutation in one of these genes, it may impact future medical management recommendations such as increased cancer screenings and consideration of risk reducing surgeries.  A positive result could also have implications for the patient's family members.  A Negative result would mean we were unable to identify a hereditary component to her cancer, but does not rule out the possibility of a hereditary basis for her cancer.  There could be mutations that are undetectable by current technology, or in genes not yet tested or identified to increase cancer risk.   We would still recommend his children have colonoscopies starting at 32 years of age (every 5 years). Regardless of genetic test results we would recommend his sister also start having colonoscopies now.   We discussed the potential to find a Variant of Uncertain Significance or VUS.  These are variants that have not yet been identified as pathogenic or benign, and it is unknown if this variant is associated with increased cancer risk or if this is a normal finding.  Most VUS's are reclassified to benign or likely benign.   It should not be used to make medical management decisions. With time, we suspect the lab will determine the significance of any VUS's identified if any.   Based on Mr. Eifert's personal history of cancer, he meets medical criteria for genetic testing. Despite that he meets criteria, he may still have an out of pocket cost. We discussed that if his out of pocket cost  for testing is over $100, the laboratory will call and confirm whether he wants to proceed with testing.  If the out of pocket cost of testing is less than $100 he will be billed by the genetic testing laboratory.   PLAN: After considering the risks, benefits, and limitations, Mr. Sigl  provided informed consent to pursue genetic testing and the blood sample was sent to [facility] for analysis of the Common Hereditary Cancer Panel. Results should be available within approximately 2-3 weeks' time, at which point they will be disclosed by telephone to Mr. Weigand, as will any additional recommendations warranted by these results. Mr. Laughter will receive a summary of his genetic counseling visit and a copy of his results once available. This information will also be available in Epic. We encouraged Mr. Ahart to remain in contact with cancer genetics annually so that we can continuously update the family history and inform him of any changes in cancer genetics and testing that may be of benefit for his family. Mr. Tom's questions were answered to his satisfaction today. Our contact information was provided should additional questions or concerns arise.  Lastly, we encouraged Mr. Barros to remain in contact with cancer genetics annually so that we can continuously update the family history and inform him of any changes in cancer genetics and testing that may be of benefit for this family.   Mr.  Ord's questions were answered to his satisfaction today. Our contact information was provided should additional questions or concerns arise. Thank you for the referral and allowing [place] to share in the care of your patient.   Tana Felts, MS Genetic Counselor [email or web address] phone: [phone]  The patient was seen for a total of 30 minutes in face-to-face genetic counseling. The patient was accompanied today by his wife [name].

## 2016-10-22 ENCOUNTER — Encounter: Payer: Self-pay | Admitting: Genetics

## 2016-10-22 ENCOUNTER — Telehealth: Payer: Self-pay | Admitting: Genetics

## 2016-10-22 ENCOUNTER — Ambulatory Visit: Payer: Self-pay | Admitting: Genetics

## 2016-10-22 DIAGNOSIS — Z1379 Encounter for other screening for genetic and chromosomal anomalies: Secondary | ICD-10-CM

## 2016-10-22 DIAGNOSIS — Z801 Family history of malignant neoplasm of trachea, bronchus and lung: Secondary | ICD-10-CM

## 2016-10-22 DIAGNOSIS — C189 Malignant neoplasm of colon, unspecified: Secondary | ICD-10-CM

## 2016-10-22 NOTE — Progress Notes (Signed)
HPI: Mr. Zimbelman was previously seen in the [place] clinic on 10/12/2016 due to a personal history of colon cancer and concerns regarding a hereditary predisposition to cancer. Please refer to our prior cancer genetics clinic note for more information regarding Mr. Savage's medical, social and family histories, and our assessment and recommendations, at the time. Mr. Sites's recent genetic test results were disclosed to him, as well as recommendations warranted by these results. These results and recommendations are discussed in more detail below.  CANCER HISTORY:    Colon cancer [facility])    Initial Diagnosis    Colon cancer ([place])     10/21/2016 Genetic Testing    The patient had genetic testing due to a personal history of colon cancer at 56.  He was tested for the Common Hereditary Cancer Panel.  The Hereditary Gene Panel offered by [facility] includes sequencing and/or deletion duplication testing of the following 46 genes: APC, ATM, AXIN2, BARD1, BMPR1A, BRCA1, BRCA2, BRIP1, CDH1, CDKN2A (p14ARF), CDKN2A (p16INK4a), CHEK2, CTNNA1, DICER1, EPCAM (Deletion/duplication testing only), GREM1 (promoter region deletion/duplication testing only), KIT, MEN1, MLH1, MSH2, MSH3, MSH6, MUTYH, NBN, NF1, NHTL1, PALB2, PDGFRA, PMS2, POLD1, POLE, PTEN, RAD50, RAD51C, RAD51D, SDHB, SDHC, SDHD, SMAD4, SMARCA4. STK11, TP53, TSC1, TSC2, and VHL.  The following genes were evaluated for sequence changes only: SDHA and HOXB13 c.251G>A variant only.  Results: No pathogenic mutations identified.  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  The date of this test report is 10/21/2016.         FAMILY HISTORY:  We obtained a detailed, 4-generation family history.  Significant diagnoses are listed below: Family History  Problem Relation Age of Onset  . Lung cancer Father 3  . Lung cancer Paternal Grandfather        dx in 45's, died at 43  . Heart attack Maternal  Grandmother 75   Mr. Portner has an 75 year-old son, a 68 year-old daughter, and a 30 year-old daughter who are all healthy.  Mr. Risdon has a 59 year-old sister who is 63 with no history of cancer.  This sister has 2 sons (59yo,4yo) and a daughter (2yo)withy no history of cancer.  Mr. Sandefer's father died from lung cancer at 73.  He had a history of smoking. Mr. Zuercher has a paternal uncle who is 62 with no history of cancer.  This uncle has 5 children in their 20's with no history of cancer. Mr. Zobrist has a paternal aunt who is 9 with no history of cancer.  She has 2 daughters and a son who are in their 1's and 24's with no history of cancer.  Mr. Seif's paternal grandfather was diagnosed with lung cancer in his 62's and died at 73.  He had a history of smoking.  This grandfather's mother lived to 24 without caner and his mother died at 73 with no history of cancer. Mr. Brun's paternal grandmother is 74 with no history of cancer. No information is known about his family history.   Mr. Kunath mother is 105 with no history of cancer. She had a hysterectomy in her late 71's and has not had a colonoscopy.  Mr. Prouty has 2 paternal aunts and 1 paternal uncle listed below: -1 paternal aunt  is in her 13's with no history of cancer.  She has 3 children in their 59's with no history of cancer.  -1 paternal is in her 34's with no history of cancer.  She has 3 children in their 36's with no history of cancer.  -1 paternal uncle is 68 with no history of cancer.  He has 3 children in their 28's with no history of cancer.  Mr. Estis's maternal grandfather died in his 67's, and the cause is unknown.  There is no known cancer on his side of the family.   Mr. Chrobak's maternal grandmother died at 34 from a heart attack. There is no known history of cancer on her side of the family.    Mr. Guay is unaware of previous family history of genetic testing for hereditary cancer risks. Patient's maternal ancestors are of  [place] and Native American descent, and paternal ancestors are of Northern European descent. There is no reported Ashkenazi Jewish ancestry. There is no known consanguinity.  GENETIC TEST RESULTS: Genetic testing performed through [facility]'s Common Hereditary cancer panel reported out on 10/21/2016 showed no pathogenic mutations. The Hereditary Gene Panel offered by [facility] includes sequencing and/or deletion duplication testing of the following 46 genes: APC, ATM, AXIN2, BARD1, BMPR1A, BRCA1, BRCA2, BRIP1, CDH1, CDKN2A (p14ARF), CDKN2A (p16INK4a), CHEK2, CTNNA1, DICER1, EPCAM (Deletion/duplication testing only), GREM1 (promoter region deletion/duplication testing only), KIT, MEN1, MLH1, MSH2, MSH3, MSH6, MUTYH, NBN, NF1, NHTL1, PALB2, PDGFRA, PMS2, POLD1, POLE, PTEN, RAD50, RAD51C, RAD51D, SDHB, SDHC, SDHD, SMAD4, SMARCA4. STK11, TP53, TSC1, TSC2, and VHL.  The following genes were evaluated for sequence changes only: SDHA and HOXB13 c.251G>A variant only..  A variant of uncertain significance (VUS) in a gene called DICER1c.3677A>C (p.Glu1226Ala) was also noted.  The test report will be scanned into EPIC and will be located under the Molecular Pathology section of the Results Review tab.A portion of the result report is included below for reference.      We discussed with Mr. Grandison that because current genetic testing is not perfect, it is possible there may be a gene mutation in one of these genes that current testing cannot detect, but that chance is small. We also discussed, that there could be another gene that has not yet been discovered, or that we have not yet tested, that is responsible for the cancer diagnoses in the family. Therefore, it is important to remain in touch with cancer genetics in the future so that we can continue to offer Mr. Ozawa the most up to date genetic testing.   Regarding the VUS in DICER1: At this time, it is unknown if this variant is associated with  increased cancer risk or if this is a normal finding, but most variants such as this get reclassified to being inconsequential. It should not be used to make medical management decisions. With time, we suspect the lab will determine the significance of this variant, if any. If we do learn more about it, we will try to contact Mr. Koeneman to discuss it further. However, it is important to stay in touch with [place] periodically and keep the address and phone number up to date.  ADDITIONAL GENETIC TESTING: We discussed with Mr. Everitt that there are other genes that are associated with increased cancer risk that can be analyzed. The laboratories that offer this testing look at these additional genes via a hereditary cancer gene panel. Should Mr. Basu wish to pursue additional genetic testing, we are happy to discuss and coordinate this testing, at any time.    CANCER SCREENING RECOMMENDATIONS: Based on these negative genetic test results, there areno additional cancer risks we are aware of for Mr. Zaragosa, and no additional screening or  medical management that we would recommend for him at this time based on genetic testing.    This result indicates that it is unlikely Mr. Winquist has an increased risk for a future cancer due to a mutation in one of these genes. This normal test also suggests that Mr. Xiang's cancer was most likely not due to an inherited predisposition associated with one of these genes.  Most cancers happen by chance and this negative test suggests that his cancer may fall into this category.    Therefore, it is recommended he continue to follow the cancer management and screening guidelines provided by his oncology and primary healthcare provider. Other factors such as her personal history of colon cancer, other medical history, and family history may still affect his cancer risk.  RECOMMENDATIONS FOR FAMILY MEMBERS: Women in this family might be at some increased risk of developing cancer, over  the general population risk, simply due to the family history of cancer. We recommended women in this family have a yearly mammogram beginning at age 80, or 33 years younger than the earliest onset of cancer, an annual clinical breast exam, and perform monthly breast self-exams. Women in this family should also have a gynecological exam as recommended by their primary provider.   All first degree relatives (siblings, parents, children) are recommended to start having colonoscopies at age 52 or 17 years earlier than the youngest colon cancer diagnosis in the family (and repeat every 5 years or as recommended based on symptoms/findings).  Therefore, we recommended his children start having colonoscopies at 59 years of age. We also recommend that his sister, who is 35, start having colonoscopies now.   FOLLOW-UP: Lastly, we discussed with Mr. Vanover that cancer genetics is a rapidly advancing field and it is possible that new genetic tests will be appropriate for him and/or his family members in the future. We encouraged him to remain in contact with cancer genetics on an annual basis so we can update his personal and family histories and let him know of advances in cancer genetics that may benefit this family.   Our contact number was provided. Mr. Mulrooney's questions were answered to his satisfaction, and he knows he is welcome to call us at anytime with additional questions or concerns.   Ferol Luz, MS Genetic Counselor [email or web address]

## 2016-10-22 NOTE — Telephone Encounter (Signed)
Revealed negative genetic testing.  Revealed a VUS in DICER1 was identified.  Discussed that we do not know why he has colon cancer or why there is cancer in the family. It could be due to a different gene that we are not testing, or maybe our current technology may not be able to pick something up.  It will be important for him to keep in contact with genetics to keep up with whether additional testing may be needed.  Recommended his sister start having colonoscopies now, and that his children start colonoscopy screening at 54.

## 2016-10-26 NOTE — Patient Instructions (Signed)
Andres Wallace. Andres Wallace  10/26/2016   Your procedure is scheduled on: 11/10/16  Report to [facility] Main  Entrance Take [place]  elevators to 3rd floor to  [place] at       1200 PM.   Call this number if you have problems the morning of surgery [phone]    Remember: ONLY 1 PERSON MAY GO WITH YOU TO SHORT STAY TO GET  READY MORNING OF [place].  Do not  drink liquids :After 0800 AM     Take these medicines the morning of surgery with A SIP OF WATER: NONE                                You may not have any metal on your body including hair pins and              piercings  Do not wear jewelry,  lotions, powders or perfumes, deodorant                       Men may shave face and neck.  Do not bring valuables to the hospital. [place].  Contacts, dentures or bridgework may not be worn into surgery.  Leave suitcase in the car. After surgery it may be brought to your room.                 Please read over the following fact sheets you were given: _____________________________________________________________________          [facility] - Preparing for Surgery Before surgery, you can play an important role.  Because skin is not sterile, your skin needs to be as free of germs as possible.  You can reduce the number of germs on your skin by washing with CHG (chlorahexidine gluconate) soap before surgery.  CHG is an antiseptic cleaner which kills germs and bonds with the skin to continue killing germs even after washing. Please DO NOT use if you have an allergy to CHG or antibacterial soaps.  If your skin becomes reddened/irritated stop using the CHG and inform your nurse when you arrive at Short Stay. Do not shave (including legs and underarms) for at least 48 hours prior to the first CHG shower.  You may shave your face/neck. Please follow these instructions carefully:  1.  Shower with CHG Soap the night  before surgery and the  morning of Surgery.  2.  If you choose to wash your hair, wash your hair first as usual with your  normal  shampoo.  3.  After you shampoo, rinse your hair and body thoroughly to remove the  shampoo.                           4.  Use CHG as you would any other liquid soap.  You can apply chg directly  to the skin and wash                       Gently with a scrungie or clean washcloth.  5.  Apply the CHG Soap to your body ONLY FROM THE NECK DOWN.   Do not use on  face/ open                           Wound or open sores. Avoid contact with eyes, ears mouth and genitals (private parts).                       Wash face,  Genitals (private parts) with your normal soap.             6.  Wash thoroughly, paying special attention to the area where your surgery  will be performed.  7.  Thoroughly rinse your body with warm water from the neck down.  8.  DO NOT shower/wash with your normal soap after using and rinsing off  the CHG Soap.                9.  Pat yourself dry with a clean towel.            10.  Wear clean pajamas.            11.  Place clean sheets on your bed the night of your first shower and do not  sleep with pets. Day of Surgery : Do not apply any lotions/deodorants the morning of surgery.  Please wear clean clothes to the hospital/surgery center.  FAILURE TO FOLLOW THESE INSTRUCTIONS MAY RESULT IN THE CANCELLATION OF YOUR SURGERY PATIENT SIGNATURE_________________________________  NURSE SIGNATURE__________________________________  ________________________________________________________________________   FOLLOW BOWEL PREP PER MD  CLEAR LIQUID DIET   THE DAY OF YOUR BOWEL PREP AND TILL 0800 AM THEN NOTHING BY MOUTH   Foods Allowed                                                                     Foods Excluded  Coffee and tea, regular and decaf                             liquids that you cannot  Plain Jell-O in any flavor                                              see through such as: Fruit ices (not with fruit pulp)                                     milk, soups, orange juice  Iced Popsicles                                    All solid food Carbonated beverages, regular and diet                                    Cranberry, grape and apple juices Sports drinks like Gatorade Lightly seasoned clear broth or consume(fat free) Sugar, honey syrup  Sample Menu Breakfast  Lunch                                     Supper Cranberry juice                    Beef broth                            Chicken broth Jell-O                                     Grape juice                           Apple juice Coffee or tea                        Jell-O                                      Popsicle                                                Coffee or tea                        Coffee or tea  _____________________________________________________________________   WHAT IS A BLOOD TRANSFUSION? Blood Transfusion Information  A transfusion is the replacement of blood or some of its parts. Blood is made up of multiple cells which provide different functions.  Red blood cells carry oxygen and are used for blood loss replacement.  White blood cells fight against infection.  Platelets control bleeding.  Plasma helps clot blood.  Other blood products are available for specialized needs, such as hemophilia or other clotting disorders. BEFORE THE TRANSFUSION  Who gives blood for transfusions?   Healthy volunteers who are fully evaluated to make sure their blood is safe. This is blood bank blood. Transfusion therapy is the safest it has ever been in the practice of medicine. Before blood is taken from a donor, a complete history is taken to make sure that person has no history of diseases nor engages in risky social behavior (examples are intravenous drug use or sexual activity with multiple partners). The donor's travel  history is screened to minimize risk of transmitting infections, such as malaria. The donated blood is tested for signs of infectious diseases, such as HIV and hepatitis. The blood is then tested to be sure it is compatible with you in order to minimize the chance of a transfusion reaction. If you or a relative donates blood, this is often done in anticipation of surgery and is not appropriate for emergency situations. It takes many days to process the donated blood. RISKS AND COMPLICATIONS Although transfusion therapy is very safe and saves many lives, the main dangers of transfusion include:   Getting an infectious disease.  Developing a transfusion reaction. This is an allergic reaction to something in the blood you were given. Every precaution is taken to prevent this. The decision to have  a blood transfusion has been considered carefully by your caregiver before blood is given. Blood is not given unless the benefits outweigh the risks. AFTER THE TRANSFUSION  Right after receiving a blood transfusion, you will usually feel much better and more energetic. This is especially true if your red blood cells have gotten low (anemic). The transfusion raises the level of the red blood cells which carry oxygen, and this usually causes an energy increase.  The nurse administering the transfusion will monitor you carefully for complications. HOME CARE INSTRUCTIONS  No special instructions are needed after a transfusion. You may find your energy is better. Speak with your caregiver about any limitations on activity for underlying diseases you may have. SEEK MEDICAL CARE IF:   Your condition is not improving after your transfusion.  You develop redness or irritation at the intravenous (IV) site. SEEK IMMEDIATE MEDICAL CARE IF:  Any of the following symptoms occur over the next 12 hours:  Shaking chills.  You have a temperature by mouth above 102 F (38.9 C), not controlled by medicine.  Chest,  back, or muscle pain.  People around you feel you are not acting correctly or are confused.  Shortness of breath or difficulty breathing.  Dizziness and fainting.  You get a rash or develop hives.  You have a decrease in urine output.  Your urine turns a dark color or changes to pink, red, or brown. Any of the following symptoms occur over the next 10 days:  You have a temperature by mouth above 102 F (38.9 C), not controlled by medicine.  Shortness of breath.  Weakness after normal activity.  The white part of the eye turns yellow (jaundice).  You have a decrease in the amount of urine or are urinating less often.  Your urine turns a dark color or changes to pink, red, or brown. Document Released: 02/13/2000 Document Revised: 05/10/2011 Document Reviewed: 10/02/2007 ExitCare Patient Information 2014 [place].  _______________________________________________________________________  Incentive Spirometer  An incentive spirometer is a tool that can help keep your lungs clear and active. This tool measures how well you are filling your lungs with each breath. Taking long deep breaths may help reverse or decrease the chance of developing breathing (pulmonary) problems (especially infection) following:  A long period of time when you are unable to move or be active. BEFORE THE PROCEDURE   If the spirometer includes an indicator to show your best effort, your nurse or respiratory therapist will set it to a desired goal.  If possible, sit up straight or lean slightly forward. Try not to slouch.  Hold the incentive spirometer in an upright position. INSTRUCTIONS FOR USE  1. Sit on the edge of your bed if possible, or sit up as far as you can in bed or on a chair. 2. Hold the incentive spirometer in an upright position. 3. Breathe out normally. 4. Place the mouthpiece in your mouth and seal your lips tightly around it. 5. Breathe in slowly and as deeply as possible,  raising the piston or the ball toward the top of the column. 6. Hold your breath for 3-5 seconds or for as long as possible. Allow the piston or ball to fall to the bottom of the column. 7. Remove the mouthpiece from your mouth and breathe out normally. 8. Rest for a few seconds and repeat Steps 1 through 7 at least 10 times every 1-2 hours when you are awake. Take your time and take a few normal breaths between  deep breaths. 9. The spirometer may include an indicator to show your best effort. Use the indicator as a goal to work toward during each repetition. 10. After each set of 10 deep breaths, practice coughing to be sure your lungs are clear. If you have an incision (the cut made at the time of surgery), support your incision when coughing by placing a pillow or rolled up towels firmly against it. Once you are able to get out of bed, walk around indoors and cough well. You may stop using the incentive spirometer when instructed by your caregiver.  RISKS AND COMPLICATIONS  Take your time so you do not get dizzy or light-headed.  If you are in pain, you may need to take or ask for pain medication before doing incentive spirometry. It is harder to take a deep breath if you are having pain. AFTER USE  Rest and breathe slowly and easily.  It can be helpful to keep track of a log of your progress. Your caregiver can provide you with a simple table to help with this. If you are using the spirometer at home, follow these instructions: [place] IF:   You are having difficultly using the spirometer.  You have trouble using the spirometer as often as instructed.  Your pain medication is not giving enough relief while using the spirometer.  You develop fever of 100.5 F (38.1 C) or higher. SEEK IMMEDIATE MEDICAL CARE IF:   You cough up bloody sputum that had not been present before.  You develop fever of 102 F (38.9 C) or greater.  You develop worsening pain at or near the  incision site. MAKE SURE YOU:   Understand these instructions.  Will watch your condition.  Will get help right away if you are not doing well or get worse. Document Released: 06/28/2006 Document Revised: 05/10/2011 Document Reviewed: 08/29/2006 [facility] Patient Information 2014 [place], [place].   ________________________________________________________________________

## 2016-11-04 ENCOUNTER — Encounter (HOSPITAL_COMMUNITY)
Admission: RE | Admit: 2016-11-04 | Discharge: 2016-11-04 | Disposition: A | Discharge status: Home / Self Care | Payer: No Typology Code available for payment source | Source: Ambulatory Visit | Attending: General Surgery | Admitting: General Surgery

## 2016-11-04 ENCOUNTER — Encounter (HOSPITAL_COMMUNITY): Payer: Self-pay

## 2016-11-04 DIAGNOSIS — Z01812 Encounter for preprocedural laboratory examination: Secondary | ICD-10-CM | POA: Insufficient documentation

## 2016-11-04 DIAGNOSIS — C189 Malignant neoplasm of colon, unspecified: Secondary | ICD-10-CM | POA: Diagnosis not present

## 2016-11-04 HISTORY — DX: Unspecified convulsions: R56.9

## 2016-11-04 LAB — BASIC METABOLIC PANEL
Anion gap: 10 (ref 5–15)
BUN: 14 mg/dL (ref 6–20)
CALCIUM: 9 mg/dL (ref 8.9–10.3)
CO2: 26 mmol/L (ref 22–32)
CREATININE: 1.25 mg/dL — AB (ref 0.61–1.24)
Chloride: 101 mmol/L (ref 101–111)
GFR calc Af Amer: >60 mL/min (ref >60)
GFR calc non Af Amer: >60 mL/min (ref >60)
GLUCOSE: 114 mg/dL — AB (ref 65–99)
Potassium: 3.6 mmol/L (ref 3.5–5.1)
Sodium: 137 mmol/L (ref 135–145)

## 2016-11-04 LAB — CBC
HEMATOCRIT: 39 % (ref 39.0–52.0)
Hemoglobin: 13.5 g/dL (ref 13.0–17.0)
MCH: 30.6 pg (ref 26.0–34.0)
MCHC: 34.6 g/dL (ref 30.0–36.0)
MCV: 88.4 fL (ref 78.0–100.0)
PLATELETS: 194 10*3/uL (ref 150–400)
RBC: 4.41 MIL/uL (ref 4.22–5.81)
RDW: 12.3 % (ref 11.5–15.5)
WBC: 8 10*3/uL (ref 4.0–10.5)

## 2016-11-04 LAB — ABO/RH: ABO/RH(D): O NEG

## 2016-11-10 ENCOUNTER — Encounter (HOSPITAL_COMMUNITY): Payer: Self-pay | Admitting: Emergency Medicine

## 2016-11-10 ENCOUNTER — Inpatient Hospital Stay (HOSPITAL_COMMUNITY): Payer: PRIVATE HEALTH INSURANCE | Admitting: Certified Registered"

## 2016-11-10 ENCOUNTER — Inpatient Hospital Stay (HOSPITAL_COMMUNITY)
Admission: RE | Admit: 2016-11-10 | Discharge: 2016-11-12 | DRG: 331 | Disposition: A | Discharge status: Home / Self Care | Payer: PRIVATE HEALTH INSURANCE | Source: Ambulatory Visit | Attending: General Surgery | Admitting: General Surgery

## 2016-11-10 ENCOUNTER — Encounter (HOSPITAL_COMMUNITY)
Admission: RE | Disposition: A | Discharge status: Home / Self Care | Payer: Self-pay | Source: Ambulatory Visit | Attending: General Surgery

## 2016-11-10 DIAGNOSIS — C187 Malignant neoplasm of sigmoid colon: Secondary | ICD-10-CM | POA: Diagnosis present

## 2016-11-10 DIAGNOSIS — G40909 Epilepsy, unspecified, not intractable, without status epilepticus: Secondary | ICD-10-CM | POA: Diagnosis present

## 2016-11-10 DIAGNOSIS — Z9089 Acquired absence of other organs: Secondary | ICD-10-CM

## 2016-11-10 DIAGNOSIS — C186 Malignant neoplasm of descending colon: Principal | ICD-10-CM | POA: Diagnosis present

## 2016-11-10 DIAGNOSIS — Z8719 Personal history of other diseases of the digestive system: Secondary | ICD-10-CM

## 2016-11-10 DIAGNOSIS — C772 Secondary and unspecified malignant neoplasm of intra-abdominal lymph nodes: Secondary | ICD-10-CM

## 2016-11-10 DIAGNOSIS — Z79899 Other long term (current) drug therapy: Secondary | ICD-10-CM

## 2016-11-10 HISTORY — PX: PROCTOSCOPY: SHX2266

## 2016-11-10 LAB — TYPE AND SCREEN
ABO/RH(D): O NEG
ANTIBODY SCREEN: NEGATIVE

## 2016-11-10 SURGERY — COLECTOMY, PARTIAL, ROBOT-ASSISTED, LAPAROSCOPIC
Anesthesia: General | Site: Abdomen

## 2016-11-10 MED ORDER — DEXAMETHASONE SODIUM PHOSPHATE 10 MG/ML IJ SOLN
INTRAMUSCULAR | Status: DC | PRN
  Administered 2016-11-10: 10 mg via INTRAVENOUS

## 2016-11-10 MED ORDER — ROCURONIUM BROMIDE 10 MG/ML (PF) SYRINGE
PREFILLED_SYRINGE | INTRAVENOUS | Status: DC | PRN
  Administered 2016-11-10: 50 mg via INTRAVENOUS
  Administered 2016-11-10: 30 mg via INTRAVENOUS
  Administered 2016-11-10: 20 mg via INTRAVENOUS

## 2016-11-10 MED ORDER — BUPIVACAINE LIPOSOME 1.3 % IJ SUSP
INTRAMUSCULAR | Status: DC | PRN
  Administered 2016-11-10: 20 mL

## 2016-11-10 MED ORDER — LACTATED RINGERS IV SOLN
INTRAVENOUS | Status: DC
  Administered 2016-11-10 (×2): via INTRAVENOUS

## 2016-11-10 MED ORDER — GABAPENTIN 300 MG PO CAPS
300.0000 mg | ORAL_CAPSULE | ORAL | Status: AC
  Administered 2016-11-10: 300 mg via ORAL
  Filled 2016-11-10: qty 1

## 2016-11-10 MED ORDER — ENOXAPARIN SODIUM 40 MG/0.4ML ~~LOC~~ SOLN
40.0000 mg | SUBCUTANEOUS | Status: DC
  Administered 2016-11-11 – 2016-11-12 (×2): 40 mg via SUBCUTANEOUS
  Filled 2016-11-10 (×2): qty 0.4

## 2016-11-10 MED ORDER — CELECOXIB 200 MG PO CAPS
400.0000 mg | ORAL_CAPSULE | ORAL | Status: AC
  Administered 2016-11-10: 400 mg via ORAL
  Filled 2016-11-10: qty 2

## 2016-11-10 MED ORDER — ACETAMINOPHEN 500 MG PO TABS
1000.0000 mg | ORAL_TABLET | ORAL | Status: AC
  Administered 2016-11-10: 1000 mg via ORAL
  Filled 2016-11-10: qty 2

## 2016-11-10 MED ORDER — MIDAZOLAM HCL 5 MG/5ML IJ SOLN
INTRAMUSCULAR | Status: DC | PRN
  Administered 2016-11-10: 2 mg via INTRAVENOUS

## 2016-11-10 MED ORDER — HEPARIN SODIUM (PORCINE) 5000 UNIT/ML IJ SOLN
5000.0000 [IU] | Freq: Once | INTRAMUSCULAR | Status: AC
  Administered 2016-11-10: 5000 [IU] via SUBCUTANEOUS
  Filled 2016-11-10: qty 1

## 2016-11-10 MED ORDER — CEFOTETAN DISODIUM 2 G IJ SOLR
2.0000 g | Freq: Two times a day (BID) | INTRAMUSCULAR | Status: AC
  Administered 2016-11-10: 2 g via INTRAVENOUS
  Filled 2016-11-10: qty 2

## 2016-11-10 MED ORDER — OXYCODONE HCL 5 MG PO TABS: 5.0000 mg | ORAL_TABLET | Freq: Once | ORAL | Status: DC | PRN

## 2016-11-10 MED ORDER — SUGAMMADEX SODIUM 200 MG/2ML IV SOLN
INTRAVENOUS | Status: AC
  Filled 2016-11-10: qty 4

## 2016-11-10 MED ORDER — HYDROMORPHONE HCL-NACL 0.5-0.9 MG/ML-% IV SOSY
PREFILLED_SYRINGE | INTRAVENOUS | Status: AC
  Filled 2016-11-10: qty 1

## 2016-11-10 MED ORDER — BOOST / RESOURCE BREEZE PO LIQD
1.0000 | Freq: Three times a day (TID) | ORAL | Status: DC
  Administered 2016-11-10 – 2016-11-11 (×3): 1 via ORAL

## 2016-11-10 MED ORDER — ROCURONIUM BROMIDE 50 MG/5ML IV SOSY
PREFILLED_SYRINGE | INTRAVENOUS | Status: AC
  Filled 2016-11-10: qty 5

## 2016-11-10 MED ORDER — LIDOCAINE 2% (20 MG/ML) 5 ML SYRINGE
INTRAMUSCULAR | Status: DC | PRN
  Administered 2016-11-10: 100 mg via INTRAVENOUS

## 2016-11-10 MED ORDER — FENTANYL CITRATE (PF) 100 MCG/2ML IJ SOLN
INTRAMUSCULAR | Status: DC | PRN
  Administered 2016-11-10: 50 ug via INTRAVENOUS
  Administered 2016-11-10: 100 ug via INTRAVENOUS
  Administered 2016-11-10 (×2): 50 ug via INTRAVENOUS
  Administered 2016-11-10: 100 ug via INTRAVENOUS
  Administered 2016-11-10: 50 ug via INTRAVENOUS

## 2016-11-10 MED ORDER — FENTANYL CITRATE (PF) 250 MCG/5ML IJ SOLN
INTRAMUSCULAR | Status: AC
  Filled 2016-11-10: qty 5

## 2016-11-10 MED ORDER — KETAMINE HCL 10 MG/ML IJ SOLN
INTRAMUSCULAR | Status: DC | PRN
  Administered 2016-11-10: 20 mg via INTRAVENOUS
  Administered 2016-11-10: 40 mg via INTRAVENOUS

## 2016-11-10 MED ORDER — ONDANSETRON HCL 4 MG/2ML IJ SOLN: 4.0000 mg | Freq: Four times a day (QID) | INTRAMUSCULAR | Status: DC | PRN

## 2016-11-10 MED ORDER — LAMOTRIGINE 100 MG PO TABS
200.0000 mg | ORAL_TABLET | Freq: Two times a day (BID) | ORAL | Status: DC
  Administered 2016-11-10 – 2016-11-11 (×2): 200 mg via ORAL
  Filled 2016-11-10 (×2): qty 2

## 2016-11-10 MED ORDER — LIDOCAINE 2% (20 MG/ML) 5 ML SYRINGE
INTRAMUSCULAR | Status: AC
  Filled 2016-11-10: qty 5

## 2016-11-10 MED ORDER — LAMOTRIGINE ER 200 MG PO TB24: 400.0000 mg | ORAL_TABLET | Freq: Every day | ORAL | Status: DC

## 2016-11-10 MED ORDER — ONDANSETRON HCL 4 MG/2ML IJ SOLN
INTRAMUSCULAR | Status: DC | PRN
  Administered 2016-11-10: 4 mg via INTRAVENOUS

## 2016-11-10 MED ORDER — ONDANSETRON HCL 4 MG PO TABS: 4.0000 mg | ORAL_TABLET | Freq: Four times a day (QID) | ORAL | Status: DC | PRN

## 2016-11-10 MED ORDER — KCL IN DEXTROSE-NACL 20-5-0.45 MEQ/L-%-% IV SOLN
INTRAVENOUS | Status: DC
  Administered 2016-11-10 – 2016-11-11 (×2): via INTRAVENOUS
  Filled 2016-11-10 (×2): qty 1000

## 2016-11-10 MED ORDER — BUPIVACAINE-EPINEPHRINE (PF) 0.25% -1:200000 IJ SOLN
INTRAMUSCULAR | Status: AC
  Filled 2016-11-10: qty 30

## 2016-11-10 MED ORDER — HYDROMORPHONE HCL-NACL 0.5-0.9 MG/ML-% IV SOSY
PREFILLED_SYRINGE | INTRAVENOUS | Status: DC
Start: 2016-11-10 — End: 2016-11-10
  Filled 2016-11-10: qty 1

## 2016-11-10 MED ORDER — MORPHINE SULFATE (PF) 2 MG/ML IV SOLN
2.0000 mg | INTRAVENOUS | Status: DC | PRN
  Administered 2016-11-10: 2 mg via INTRAVENOUS
  Filled 2016-11-10: qty 1

## 2016-11-10 MED ORDER — MIDAZOLAM HCL 2 MG/2ML IJ SOLN
INTRAMUSCULAR | Status: AC
  Filled 2016-11-10: qty 2

## 2016-11-10 MED ORDER — HYDROMORPHONE HCL-NACL 0.5-0.9 MG/ML-% IV SOSY
0.2500 mg | PREFILLED_SYRINGE | INTRAVENOUS | Status: DC | PRN
  Administered 2016-11-10 (×2): 0.5 mg via INTRAVENOUS

## 2016-11-10 MED ORDER — LIDOCAINE 2% (20 MG/ML) 5 ML SYRINGE
INTRAMUSCULAR | Status: DC | PRN
  Administered 2016-11-10: 1.5 mg/kg/h via INTRAVENOUS

## 2016-11-10 MED ORDER — ALUM & MAG HYDROXIDE-SIMETH 200-200-20 MG/5ML PO SUSP: 30.0000 mL | Freq: Four times a day (QID) | ORAL | Status: DC | PRN

## 2016-11-10 MED ORDER — ALVIMOPAN 12 MG PO CAPS
12.0000 mg | ORAL_CAPSULE | ORAL | Status: AC
  Administered 2016-11-10: 12 mg via ORAL
  Filled 2016-11-10: qty 1

## 2016-11-10 MED ORDER — SACCHAROMYCES BOULARDII 250 MG PO CAPS
250.0000 mg | ORAL_CAPSULE | Freq: Two times a day (BID) | ORAL | Status: DC
  Administered 2016-11-10 – 2016-11-12 (×4): 250 mg via ORAL
  Filled 2016-11-10 (×4): qty 1

## 2016-11-10 MED ORDER — MEPERIDINE HCL 50 MG/ML IJ SOLN: 6.2500 mg | INTRAMUSCULAR | Status: DC | PRN

## 2016-11-10 MED ORDER — SUGAMMADEX SODIUM 200 MG/2ML IV SOLN
INTRAVENOUS | Status: DC | PRN
  Administered 2016-11-10: 175 mg via INTRAVENOUS

## 2016-11-10 MED ORDER — 0.9 % SODIUM CHLORIDE (POUR BTL) OPTIME
TOPICAL | Status: DC | PRN
  Administered 2016-11-10: 2000 mL

## 2016-11-10 MED ORDER — ONDANSETRON HCL 4 MG/2ML IJ SOLN
INTRAMUSCULAR | Status: AC
  Filled 2016-11-10: qty 2

## 2016-11-10 MED ORDER — PROPOFOL 10 MG/ML IV BOLUS
INTRAVENOUS | Status: DC | PRN
  Administered 2016-11-10: 180 mg via INTRAVENOUS

## 2016-11-10 MED ORDER — PROMETHAZINE HCL 25 MG/ML IJ SOLN: 6.2500 mg | INTRAMUSCULAR | Status: DC | PRN

## 2016-11-10 MED ORDER — PROPOFOL 10 MG/ML IV BOLUS
INTRAVENOUS | Status: AC
  Filled 2016-11-10: qty 20

## 2016-11-10 MED ORDER — OXYCODONE HCL 5 MG/5ML PO SOLN: 5.0000 mg | Freq: Once | ORAL | Status: DC | PRN

## 2016-11-10 MED ORDER — CEFOTETAN DISODIUM-DEXTROSE 2-2.08 GM-% IV SOLR
2.0000 g | INTRAVENOUS | Status: AC
  Administered 2016-11-10: 2 g via INTRAVENOUS
  Filled 2016-11-10: qty 50

## 2016-11-10 MED ORDER — DEXAMETHASONE SODIUM PHOSPHATE 10 MG/ML IJ SOLN
INTRAMUSCULAR | Status: AC
  Filled 2016-11-10: qty 1

## 2016-11-10 MED ORDER — ACETAMINOPHEN 500 MG PO TABS
1000.0000 mg | ORAL_TABLET | Freq: Four times a day (QID) | ORAL | Status: DC
  Administered 2016-11-10 – 2016-11-12 (×8): 1000 mg via ORAL
  Filled 2016-11-10 (×8): qty 2

## 2016-11-10 MED ORDER — BUPIVACAINE LIPOSOME 1.3 % IJ SUSP
20.0000 mL | INTRAMUSCULAR | Status: DC
  Filled 2016-11-10: qty 20

## 2016-11-10 MED ORDER — ALVIMOPAN 12 MG PO CAPS: 12.0000 mg | ORAL_CAPSULE | Freq: Two times a day (BID) | ORAL | Status: DC

## 2016-11-10 MED ORDER — LACTATED RINGERS IR SOLN
Status: DC | PRN
  Administered 2016-11-10: 1000 mL

## 2016-11-10 SURGICAL SUPPLY — 96 items
BLADE EXTENDED COATED 6.5IN (ELECTRODE) IMPLANT
CANNULA REDUC XI 12-8 STAPL (CANNULA) ×1
CANNULA REDUC XI 12-8MM STAPL (CANNULA) ×1
CANNULA REDUCER 12-8 DVNC XI (CANNULA) ×2 IMPLANT
CELLS DAT CNTRL 66122 CELL SVR (MISCELLANEOUS) IMPLANT
CLIP VESOLOCK LG 6/CT PURPLE (CLIP) IMPLANT
CLIP VESOLOCK MED 6/CT (CLIP) IMPLANT
COVER MAYO STAND STRL (DRAPES) IMPLANT
COVER SURGICAL LIGHT HANDLE (MISCELLANEOUS) ×8 IMPLANT
COVER TIP SHEARS 8 DVNC (MISCELLANEOUS) ×2 IMPLANT
COVER TIP SHEARS 8MM DA VINCI (MISCELLANEOUS) ×2
DECANTER SPIKE VIAL GLASS SM (MISCELLANEOUS) ×4 IMPLANT
DERMABOND ADVANCED (GAUZE/BANDAGES/DRESSINGS) ×2
DERMABOND ADVANCED .7 DNX12 (GAUZE/BANDAGES/DRESSINGS) ×2 IMPLANT
DRAIN CHANNEL 19F RND (DRAIN) IMPLANT
DRAPE ARM DVNC X/XI (DISPOSABLE) ×8 IMPLANT
DRAPE COLUMN DVNC XI (DISPOSABLE) ×2 IMPLANT
DRAPE DA VINCI XI ARM (DISPOSABLE) ×8
DRAPE DA VINCI XI COLUMN (DISPOSABLE) ×2
DRAPE SURG IRRIG POUCH 19X23 (DRAPES) ×4 IMPLANT
DRSG OPSITE POSTOP 4X10 (GAUZE/BANDAGES/DRESSINGS) IMPLANT
DRSG OPSITE POSTOP 4X6 (GAUZE/BANDAGES/DRESSINGS) ×4 IMPLANT
DRSG OPSITE POSTOP 4X8 (GAUZE/BANDAGES/DRESSINGS) IMPLANT
ELECT PENCIL ROCKER SW 15FT (MISCELLANEOUS) ×4 IMPLANT
ELECT REM PT RETURN 15FT ADLT (MISCELLANEOUS) ×4 IMPLANT
ENDOLOOP SUT PDS II  0 18 (SUTURE)
ENDOLOOP SUT PDS II 0 18 (SUTURE) IMPLANT
EVACUATOR SILICONE 100CC (DRAIN) IMPLANT
GAUZE SPONGE 4X4 12PLY STRL (GAUZE/BANDAGES/DRESSINGS) IMPLANT
GLOVE BIO SURGEON STRL SZ 6.5 (GLOVE) ×9 IMPLANT
GLOVE BIO SURGEONS STRL SZ 6.5 (GLOVE) ×3
GLOVE BIOGEL PI IND STRL 7.0 (GLOVE) ×6 IMPLANT
GLOVE BIOGEL PI INDICATOR 7.0 (GLOVE) ×6
GOWN STRL REUS W/TWL 2XL LVL3 (GOWN DISPOSABLE) ×12 IMPLANT
GOWN STRL REUS W/TWL XL LVL3 (GOWN DISPOSABLE) ×32 IMPLANT
GRASPER ENDOPATH ANVIL 10MM (MISCELLANEOUS) IMPLANT
GRASPER SUT TROCAR 14GX15 (MISCELLANEOUS) ×4 IMPLANT
HOLDER FOLEY CATH W/STRAP (MISCELLANEOUS) ×4 IMPLANT
IRRIG SUCT STRYKERFLOW 2 WTIP (MISCELLANEOUS) ×4
IRRIGATION SUCT STRKRFLW 2 WTP (MISCELLANEOUS) ×2 IMPLANT
IRRIGATOR SUCT 8 DISP DVNC XI (IRRIGATION / IRRIGATOR) IMPLANT
IRRIGATOR SUCTION 8MM XI DISP (IRRIGATION / IRRIGATOR)
KIT PROCEDURE DA VINCI SI (MISCELLANEOUS)
KIT PROCEDURE DVNC SI (MISCELLANEOUS) IMPLANT
NEEDLE INSUFFLATION 14GA 120MM (NEEDLE) ×4 IMPLANT
PACK CARDIOVASCULAR III (CUSTOM PROCEDURE TRAY) ×4 IMPLANT
PACK COLON (CUSTOM PROCEDURE TRAY) ×4 IMPLANT
PORT LAP GEL ALEXIS MED 5-9CM (MISCELLANEOUS) ×4 IMPLANT
RTRCTR WOUND ALEXIS 18CM MED (MISCELLANEOUS)
SCISSORS LAP 5X35 DISP (ENDOMECHANICALS) ×4 IMPLANT
SEAL CANN UNIV 5-8 DVNC XI (MISCELLANEOUS) ×6 IMPLANT
SEAL XI 5MM-8MM UNIVERSAL (MISCELLANEOUS) ×6
SEALER VESSEL DA VINCI XI (MISCELLANEOUS) ×2
SEALER VESSEL EXT DVNC XI (MISCELLANEOUS) ×2 IMPLANT
SLEEVE ADV FIXATION 5X100MM (TROCAR) IMPLANT
SOLUTION ELECTROLUBE (MISCELLANEOUS) ×4 IMPLANT
STAPLER 45 BLU RELOAD XI (STAPLE) ×4 IMPLANT
STAPLER 45 BLUE RELOAD XI (STAPLE) ×4
STAPLER 45 GREEN RELOAD XI (STAPLE)
STAPLER 45 GRN RELOAD XI (STAPLE) IMPLANT
STAPLER CANNULA SEAL DVNC XI (STAPLE) ×2 IMPLANT
STAPLER CANNULA SEAL XI (STAPLE) ×2
STAPLER SHEATH (SHEATH) ×2
STAPLER SHEATH ENDOWRIST DVNC (SHEATH) ×2 IMPLANT
STAPLER VISISTAT 35W (STAPLE) IMPLANT
SUT ETHILON 2 0 PS N (SUTURE) IMPLANT
SUT NOVA NAB DX-16 0-1 5-0 T12 (SUTURE) ×12 IMPLANT
SUT NOVA NAB GS-21 1 T12 (SUTURE) IMPLANT
SUT PDS AB 1 CTX 36 (SUTURE) IMPLANT
SUT PDS AB 1 TP1 96 (SUTURE) IMPLANT
SUT PROLENE 2 0 KS (SUTURE) ×8 IMPLANT
SUT SILK 2 0 (SUTURE) ×2
SUT SILK 2 0 SH CR/8 (SUTURE) ×4 IMPLANT
SUT SILK 2-0 18XBRD TIE 12 (SUTURE) ×2 IMPLANT
SUT SILK 3 0 (SUTURE) ×2
SUT SILK 3 0 SH CR/8 (SUTURE) ×4 IMPLANT
SUT SILK 3-0 18XBRD TIE 12 (SUTURE) ×2 IMPLANT
SUT V-LOC BARB 180 2/0GR6 GS22 (SUTURE)
SUT VIC AB 2-0 SH 18 (SUTURE) ×4 IMPLANT
SUT VIC AB 2-0 SH 27 (SUTURE) ×2
SUT VIC AB 2-0 SH 27X BRD (SUTURE) ×2 IMPLANT
SUT VIC AB 3-0 SH 18 (SUTURE) ×4 IMPLANT
SUT VIC AB 4-0 PS2 18 (SUTURE) ×8 IMPLANT
SUT VIC AB 4-0 PS2 27 (SUTURE) ×8 IMPLANT
SUT VICRYL 0 UR6 27IN ABS (SUTURE) ×4 IMPLANT
SUTURE V-LC BRB 180 2/0GR6GS22 (SUTURE) IMPLANT
SYR 10ML ECCENTRIC (SYRINGE) ×4 IMPLANT
SYS LAPSCP GELPORT 120MM (MISCELLANEOUS)
SYSTEM LAPSCP GELPORT 120MM (MISCELLANEOUS) IMPLANT
TOWEL OR 17X26 10 PK STRL BLUE (TOWEL DISPOSABLE) ×4 IMPLANT
TOWEL OR NON WOVEN STRL DISP B (DISPOSABLE) ×4 IMPLANT
TRAY FOLEY W/METER SILVER 16FR (SET/KITS/TRAYS/PACK) ×4 IMPLANT
TROCAR ADV FIXATION 5X100MM (TROCAR) ×4 IMPLANT
TUBING CONNECTING 10 (TUBING) ×6 IMPLANT
TUBING CONNECTING 10' (TUBING) ×2
TUBING INSUFFLATION 10FT LAP (TUBING) ×4 IMPLANT

## 2016-11-10 NOTE — Anesthesia Procedure Notes (Signed)
Procedure Name: Intubation Date/Time: 11/10/2016 1:55 PM Performed by: Noralyn Pick D Pre-anesthesia Checklist: Patient identified, Emergency Drugs available, Suction available and Patient being monitored Patient Re-evaluated:Patient Re-evaluated prior to induction Oxygen Delivery Method: Circle system utilized Preoxygenation: Pre-oxygenation with 100% oxygen Induction Type: IV induction Ventilation: Mask ventilation without difficulty Grade View: Grade I Tube type: Oral Tube size: 7.5 mm Number of attempts: 1 Airway Equipment and Method: Stylet and Oral airway Placement Confirmation: ETT inserted through vocal cords under direct vision,  positive ETCO2 and breath sounds checked- equal and bilateral Secured at: 22 cm Tube secured with: Tape Dental Injury: Teeth and Oropharynx as per pre-operative assessment

## 2016-11-10 NOTE — H&P (Addendum)
The patient is a 36 year old male who presents with colorectal cancer. 36 year old male who presented to the office for a newly diagnosed colon cancer. He has had 1-2 months of rectal bleeding. He also developed some crampy abdominal pain several weeks ago. He was seen by his gastroenterologist and colonoscopy was performed. This showed a mass in his sigmoid or descending colon. Biopsy showed invasive adenocarcinoma. CEA level was 1.7. CT scan of the abdomen and pelvis was done yesterday. This showed no signs of metastatic disease but several mesenteric lymph nodes were enlarged.  CT chest was unremarkable as well. Patient is now asymptomatic.   Past Surgical History  Appendectomy Open Inguinal Hernia Surgery Right. Vasectomy  Diagnostic Studies History  Colonoscopy within last year  Allergies No Known Allergies 10/05/2016  Medication History  LamoTRIgine ER (200MG  Tablet ER 24HR, Oral) Active. Medications Reconciled  Social History  Alcohol use Occasional alcohol use. Caffeine use Carbonated beverages. No drug use Tobacco use Never smoker.  Family History  Respiratory Condition Father.  Other Problems  Colon Cancer Inguinal Hernia Seizure Disorder     Review of Systems  General Not Present- Appetite Loss, Chills, Fatigue, Fever, Night Sweats, Weight Gain and Weight Loss. Skin Not Present- Change in Wart/Mole, Dryness, Hives, Jaundice, New Lesions, Non-Healing Wounds, Rash and Ulcer. HEENT Not Present- Earache, Hearing Loss, Hoarseness, Nose Bleed, Oral Ulcers, Ringing in the Ears, Seasonal Allergies, Sinus Pain, Sore Throat, Visual Disturbances, Wears glasses/contact lenses and Yellow Eyes. Respiratory Not Present- Bloody sputum, Chronic Cough, Difficulty Breathing, Snoring and Wheezing. Breast Not Present- Breast Mass, Breast Pain, Nipple Discharge and Skin Changes. Cardiovascular Not Present- Chest Pain, Difficulty Breathing Lying  Down, Leg Cramps, Palpitations, Rapid Heart Rate, Shortness of Breath and Swelling of Extremities. Gastrointestinal Present- Abdominal Pain and Bloody Stool. Not Present- Bloating, Change in Bowel Habits, Chronic diarrhea, Constipation, Difficulty Swallowing, Excessive gas, Gets full quickly at meals, Hemorrhoids, Indigestion, Nausea, Rectal Pain and Vomiting. Male Genitourinary Not Present- Blood in Urine, Change in Urinary Stream, Frequency, Impotence, Nocturia, Painful Urination, Urgency and Urine Leakage. Musculoskeletal Not Present- Back Pain, Joint Pain, Joint Stiffness, Muscle Pain, Muscle Weakness and Swelling of Extremities. Neurological Not Present- Decreased Memory, Fainting, Headaches, Numbness, Seizures, Tingling, Tremor, Trouble walking and Weakness. Psychiatric Not Present- Anxiety, Bipolar, Change in Sleep Pattern, Depression, Fearful and Frequent crying. Endocrine Not Present- Cold Intolerance, Excessive Hunger, Hair Changes, Heat Intolerance, Hot flashes and New Diabetes. Hematology Not Present- Blood Thinners, Easy Bruising, Excessive bleeding, Gland problems, HIV and Persistent Infections.  BP 124/82   Pulse 85   Temp 98.3 F (36.8 C) (Oral)   Resp 18   Ht 6' (1.829 m)   Wt 82.6 kg (182 lb)   SpO2 100%   BMI 24.68 kg/m    Physical Exam   General Mental Status-Alert. General Appearance-Not in acute distress. Build & Nutrition-Well nourished. Posture-Normal posture. Gait-Normal.  Head and Neck Head-normocephalic, atraumatic with no lesions or palpable masses. Trachea-midline.  Chest and Lung Exam Chest and lung exam reveals -on auscultation, normal breath sounds, no adventitious sounds and normal vocal resonance.  Cardiovascular Cardiovascular examination reveals -normal heart sounds, regular rate and rhythm with no murmurs and no digital clubbing, cyanosis, edema, increased warmth or tenderness.  Abdomen Inspection Inspection of the  abdomen reveals - No Hernias. Palpation/Percussion Palpation and Percussion of the abdomen reveal - Soft, Non Tender, No Rigidity (guarding) and No hepatosplenomegaly. Abdominal Mass Palpable - Location - Left Lower Quadrant.  Neurologic Neurologic evaluation reveals -alert and  oriented x 3 with no impairment of recent or remote memory, normal attention span and ability to concentrate, normal sensation and normal coordination.  Musculoskeletal Normal Exam - Bilateral-Upper Extremity Strength Normal and Lower Extremity Strength Normal.    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 There is currently no signs of metastatic disease. I have recommended a left hemicolectomy. We will try to do this in a minimally invasive fashion. I recommended that he use MiraLAX and a low residue diet in the meantime to help avoid any obstructive symptoms. I referred him to the genetics office due to his young age. Testing revealed no known genetic markers.  The surgery and anatomy were described to the patient as well as the risks of surgery and the possible complications. These include: Bleeding, deep abdominal infections and possible wound complications such as hernia and infection, damage to adjacent structures, leak of surgical connections, which can lead to other surgeries and possibly an ostomy, possible need for other procedures, such as abscess drains in radiology, possible prolonged hospital stay, possible diarrhea from removal of part of the colon, possible constipation from narcotics, possible bowel, bladder or sexual dysfunction if having rectal surgery, prolonged fatigue/weakness or appetite loss, possible early recurrence of of disease, possible complications of their medical problems such as heart disease or arrhythmias or lung problems, death (less than 1%).  I believe the patient understands and wishes to proceed with the surgery.

## 2016-11-10 NOTE — Op Note (Signed)
11/10/2016  4:16 PM  PATIENT:  Andres Wallace  36 y.o. male  Patient Care Team: [name], MD as PCP - General (Family Medicine)  PRE-OPERATIVE DIAGNOSIS:  colon cancer  POST-OPERATIVE DIAGNOSIS:  colon cancer  PROCEDURE:   XI ROBOT ASSISTED SIGMOIDECTOMY   SURGEON:  Surgeon(s): Theo Boston, MD Leighton Ruff, MD  ASSISTANT: Dr Johney Maine   ANESTHESIA:   local and general  EBL: 162ml  Total I/O In: 1000 [I.V.:1000] Out: 150 [Urine:50; Blood:100]  Delay start of Pharmacological VTE agent (>24hrs) due to surgical blood loss or risk of bleeding:  no  DRAINS: none   SPECIMEN:  Source of Specimen:  Sigmoid colon  DISPOSITION OF SPECIMEN:  PATHOLOGY  COUNTS:  YES  PLAN OF CARE: Admit to inpatient   PATIENT DISPOSITION:  PACU - hemodynamically stable.  INDICATION:    36 y.o. M with colon cancer of the descending colon.  I recommended segmental resection:  The anatomy & physiology of the digestive tract was discussed.  The pathophysiology was discussed.  Natural history risks without surgery was discussed.   I worked to give an overview of the disease and the frequent need to have multispecialty involvement.  I feel the risks of no intervention will lead to serious problems that outweigh the operative risks; therefore, I recommended a partial colectomy to remove the pathology.  Laparoscopic & open techniques were discussed.   Risks such as bleeding, infection, abscess, leak, reoperation, possible ostomy, hernia, heart attack, death, and other risks were discussed.  I noted a good likelihood this will help address the problem.   Goals of post-operative recovery were discussed as well.    The patient expressed understanding & wished to proceed with surgery.  OR FINDINGS:   Patient had a mass at the descending/sigmoid junction  No obvious metastatic disease on visceral parietal peritoneum or liver.  The anastomosis rests ~15 cm from the anal verge by rigid  proctoscopy.  DESCRIPTION:   Informed consent was confirmed.  The patient underwent general anaesthesia without difficulty.  The patient was positioned appropriately.  VTE prevention in place.  The patient's abdomen was clipped, prepped, & draped in a sterile fashion.  Surgical timeout confirmed our plan.  The patient was positioned in reverse Trendelenburg.  Abdominal entry was gained using a Varies needle in the LUQ.  Entry was clean.  I induced carbon dioxide insufflation.  An 47mm robotic port was placed in the RUQ.  Camera inspection revealed no injury.  Extra ports were carefully placed under direct laparoscopic visualization.  I laparoscopically reflected the greater omentum and the upper abdomen the small bowel in the upper abdomen. The patient was appropriately positioned and the robot was docked to the patient's left side.  Instruments were placed under direct visualization.    I mobilized the sigmoid colon off of the pelvic sidewall. The tumor was located in this area so I made sure to get a margin of tissue around this. I then scored the base of peritoneum of the right side of the mesentery of the left colon from the ligament of Treitz to the peritoneal reflection of the mid rectum.  I elevated the sigmoid mesentery and enetered into the retro-mesenteric plane. We were able to identify the left ureter and gonadal vessels. We kept those posterior within the retroperitoneum and elevated the left colon mesentery off that. I did isolated IMA pedicle but did not ligate it yet.  I continued distally and got into the avascular plane posterior to  the mesorectum. This allowed me to help mobilize the rectum as well by freeing the mesorectum off the sacrum.  I mobilized the peritoneal coverings towards the peritoneal reflection on both the right and left sides of the rectum.  I could see the right and left ureters and stayed away from them.    I skeletonized the inferior mesenteric artery pedicle.  I went  down to its takeoff from the aorta.   I isolated the inferior mesenteric vein off of the ligament of Treitz just cephalad to that as well.  After confirming the left ureter was out of the way, I went ahead and ligated the inferior mesenteric artery pedicle with bipolar robotic vessel sealer ~2cm above its takeoff from the aorta.   I did ligate the inferior mesenteric vein in a similar fashion.  We ensured hemostasis. I skeletonized the mesorectum at the junction at the proximal rectum using blunt dissection & bipolar robotic vessel sealer.  I mobilized the left colon in a lateral to medial fashion off the line of Toldt up towards the splenic flexure to ensure good mobilization of the left colon to reach into the pelvis.  I then thinned out the mesentery of rectosigmoid junction using the robotic vessel sealer.  I identified a portion of descending colon ~5 cm proximal to the tumor.  I divided the mesentery to this point using the vessel sealer.  I then divided the ro  At this point a Pfannenstiel incision was made after the robot was undocked. An [place] wound protector was placed.  The colon was brought out and a purse string device was place. A 2-0 Prolene was placed through the pursestring device. The colon was then transected with cautery. The Prolene was secured with 3-0 silk sutures and tied tightly around a 33 mm EEA anvil. Once this was completed, the EEA stapler was introduced into the rectum and brought out through the top of the rectal stump.  Anastomosis was created under direct visualization. There was no tension.  There was no leak when tested with insufflation under water. The pelvis was irrigated and the omentum was brought down over the abdominal contents. The [place] wound protector was removed. We switched to clean gowns, gloves, instruments and drapes.  I closed the peritoneum using a running 2-0 Vicryl suture. The Pfannenstiel fascia was closed using interrupted #1 Novafil sutures. The  subcutaneous tissue was reapproximated using interrupted 2-0 Vicryl sutures and a 4-0 Vicryl running subcuticular suture was used to close the skin of the extraction site. The port sites were closed using the 4-0 Vicryl suture and Dermabond. The patient was then awakened from anesthesia and sent to the postanesthesia care unit in stable condition. All counts were correct per operating room staff.  An MD assistant was necessary for tissue manipulation, retraction and positioning due to the complexity of the case and hospital policies

## 2016-11-10 NOTE — Anesthesia Postprocedure Evaluation (Signed)
Anesthesia Post Note  Patient: Andres Wallace  Procedure(s) Performed: Procedure(s) (LRB): XI ROBOT ASSISTED LAPAROSCOPIC SIGMOIDECTOMY (N/A) PROCTOSCOPY     Patient location during evaluation: PACU Anesthesia Type: General Level of consciousness: sedated and patient cooperative Pain management: pain level controlled Vital Signs Assessment: post-procedure vital signs reviewed and stable Respiratory status: spontaneous breathing Cardiovascular status: stable Anesthetic complications: no    Last Vitals:  Vitals:   11/10/16 1734 11/10/16 1833  BP: 134/71 128/76  Pulse: 86 80  Resp: 16 18  Temp: 37 C 37 C  SpO2: 100% 100%    Last Pain:  Vitals:   11/10/16 1833  TempSrc: Oral  PainSc:                  Nolon Nations

## 2016-11-10 NOTE — Anesthesia Preprocedure Evaluation (Signed)
Anesthesia Evaluation  Patient identified by MRN, date of birth, ID band Patient awake    Reviewed: Allergy & Precautions, H&P , NPO status , Patient's Chart, lab work & pertinent test results  Airway Mallampati: II  TM Distance: >3 FB Neck ROM: Full    Dental no notable dental hx. (+) Dental Advisory Given   Pulmonary neg pulmonary ROS,    Pulmonary exam normal breath sounds clear to auscultation       Cardiovascular negative cardio ROS Normal cardiovascular exam Rhythm:Regular Rate:Normal     Neuro/Psych negative neurological ROS  negative psych ROS   GI/Hepatic negative GI ROS, Neg liver ROS,   Endo/Other  negative endocrine ROS  Renal/GU negative Renal ROS     Musculoskeletal negative musculoskeletal ROS (+)   Abdominal   Peds  Hematology negative hematology ROS (+)   Anesthesia Other Findings   Reproductive/Obstetrics                             Anesthesia Physical  Anesthesia Plan  ASA: II  Anesthesia Plan: General   Post-op Pain Management:    Induction: Intravenous  PONV Risk Score and Plan: 3 and Ondansetron, Dexamethasone and Midazolam  Airway Management Planned: Oral ETT  Additional Equipment:   Intra-op Plan:   Post-operative Plan: Extubation in OR  Informed Consent: I have reviewed the patients History and Physical, chart, labs and discussed the procedure including the risks, benefits and alternatives for the proposed anesthesia with the patient or authorized representative who has indicated his/her understanding and acceptance.   Dental advisory given  Plan Discussed with: CRNA  Anesthesia Plan Comments:        Anesthesia Quick Evaluation

## 2016-11-10 NOTE — Transfer of Care (Signed)
Immediate Anesthesia Transfer of Care Note  Patient: Andres Wallace  Procedure(s) Performed: Procedure(s) with comments: XI ROBOT ASSISTED LAPAROSCOPIC SIGMOIDECTOMY (N/A) - ERAS PATHWAY PROCTOSCOPY  Patient Location: PACU  Anesthesia Type:General  Level of Consciousness: awake, alert  and oriented  Airway & Oxygen Therapy: Patient Spontanous Breathing and Patient connected to face mask oxygen  Post-op Assessment: Report given to RN and Post -op Vital signs reviewed and stable  Post vital signs: Reviewed and stable  Last Vitals:  Vitals:   11/10/16 1104  BP: 124/82  Pulse: 85  Resp: 18  Temp: 36.8 C  SpO2: 100%    Last Pain:  Vitals:   11/10/16 1104  TempSrc: Oral      Patients Stated Pain Goal: 4 (21/11/73 5670)  Complications: No apparent anesthesia complications

## 2016-11-11 ENCOUNTER — Encounter: Payer: Self-pay | Admitting: Hematology

## 2016-11-11 LAB — CBC
HEMATOCRIT: 38.6 % — AB (ref 39.0–52.0)
HEMOGLOBIN: 13.3 g/dL (ref 13.0–17.0)
MCH: 29.9 pg (ref 26.0–34.0)
MCHC: 34.5 g/dL (ref 30.0–36.0)
MCV: 86.7 fL (ref 78.0–100.0)
PLATELETS: 216 10*3/uL (ref 150–400)
RBC: 4.45 MIL/uL (ref 4.22–5.81)
RDW: 12.2 % (ref 11.5–15.5)
WBC: 12.9 10*3/uL — AB (ref 4.0–10.5)

## 2016-11-11 LAB — BASIC METABOLIC PANEL
ANION GAP: 7 (ref 5–15)
BUN: 9 mg/dL (ref 6–20)
CHLORIDE: 108 mmol/L (ref 101–111)
CO2: 27 mmol/L (ref 22–32)
Calcium: 9 mg/dL (ref 8.9–10.3)
Creatinine, Ser: 1.01 mg/dL (ref 0.61–1.24)
GFR calc non Af Amer: >60 mL/min (ref >60)
Glucose, Bld: 128 mg/dL — ABNORMAL HIGH (ref 65–99)
POTASSIUM: 4.2 mmol/L (ref 3.5–5.1)
SODIUM: 142 mmol/L (ref 135–145)

## 2016-11-11 MED ORDER — PREMIER PROTEIN SHAKE
11.0000 [oz_av] | Freq: Two times a day (BID) | ORAL | Status: DC
  Administered 2016-11-11 – 2016-11-12 (×2): 11 [oz_av] via ORAL

## 2016-11-11 MED ORDER — TRAMADOL HCL 50 MG PO TABS
50.0000 mg | ORAL_TABLET | Freq: Four times a day (QID) | ORAL | Status: DC | PRN
  Administered 2016-11-12: 100 mg via ORAL
  Filled 2016-11-11: qty 2

## 2016-11-11 NOTE — Progress Notes (Signed)
Nutrition Brief Note  Patient identified on the Malnutrition Screening Tool (MST) Report  Pt denies weight loss. States he was eating 3 meals a day with snacks in between. States he was trying to consume ~3000 kcal a day. Receiving [facility], would prefer something with more protein. Will order Premier Protein.  Wt Readings from Last 15 Encounters:  11/10/16 182 lb (82.6 kg)  11/04/16 182 lb (82.6 kg)  10/15/11 191 lb (86.6 kg)  07/30/11 195 lb (88.5 kg)    Body mass index is 24.68 kg/m. Patient meets criteria for normal based on current BMI.   Current diet order is soft, patient is consuming approximately 75% of meals at this time. Labs and medications reviewed.   No nutrition interventions warranted at this time. If nutrition issues arise, please consult RD.   Clayton Bibles, MS, RD, LDN Pager: [phone] After Hours Pager: [phone]

## 2016-11-11 NOTE — Progress Notes (Signed)
1 Day Post-Op robotic sigmoidectomy Subjective: No complaints.  Having flatus and a small BM  Objective: Vital signs in last 24 hours: Temp:  [98 F (36.7 C)-98.6 F (37 C)] 98 F (36.7 C) (09/13 1007) Pulse Rate:  [63-95] 78 (09/13 1007) Resp:  [12-20] 16 (09/13 1007) BP: (116-159)/(67-87) 116/81 (09/13 1007) SpO2:  [99 %-100 %] 100 % (09/13 1007) Weight:  [82.6 kg (182 lb)] 82.6 kg (182 lb) (09/12 1134)   Intake/Output from previous day: 09/12 0701 - 09/13 0700 In: 2933.3 [P.O.:120; I.V.:2813.3] Out: 2650 [Urine:2550; Blood:100] Intake/Output this shift: Total I/O In: 240 [P.O.:240] Out: -    General appearance: alert and cooperative GI: normal findings: soft, non-tender  Incision: no significant drainage  Lab Results:   Recent Labs  11/11/16 0501  WBC 12.9*  HGB 13.3  HCT 38.6*  PLT 216   BMET  Recent Labs  11/11/16 0501  NA 142  K 4.2  CL 108  CO2 27  GLUCOSE 128*  BUN 9  CREATININE 1.01  CALCIUM 9.0   PT/INR No results for input(s): LABPROT, INR in the last 72 hours. ABG No results for input(s): PHART, HCO3 in the last 72 hours.  Invalid input(s): PCO2, PO2  MEDS, Scheduled . acetaminophen  1,000 mg Oral Q6H  . enoxaparin (LOVENOX) injection  40 mg Subcutaneous Q24H  . feeding supplement  1 Container Oral TID BM  . saccharomyces boulardii  250 mg Oral BID    Studies/Results: No results found.  Assessment: s/p Procedure(s): XI ROBOT ASSISTED LAPAROSCOPIC SIGMOIDECTOMY PROCTOSCOPY Patient Active Problem List   Diagnosis Date Noted  . Genetic testing 10/22/2016  . Colon cancer ([place])   . Family history of lung cancer     Expected post op course  Plan: d/c foley  Advance to soft diet Saline lock IV PO pain meds Possible home in AM   LOS: 1 day     .Rosario Adie, [facility], [place]   11/11/2016 10:26 AM

## 2016-11-11 NOTE — Discharge Instructions (Signed)

## 2016-11-11 NOTE — Progress Notes (Signed)
Met with patient on [address] to introduce myself and my role as the GI Navigator. I let patient know that he would be contacted by our new patient scheduler for an appointment with med/onc and encouraged him to call with questions or concerns. Patient verbalized understanding.

## 2016-11-11 NOTE — Progress Notes (Signed)
Patient was educated on Lamictal and was advised to take it like he does at home per MD order.

## 2016-11-12 LAB — BASIC METABOLIC PANEL
ANION GAP: 7 (ref 5–15)
BUN: 10 mg/dL (ref 6–20)
CALCIUM: 8.8 mg/dL — AB (ref 8.9–10.3)
CHLORIDE: 108 mmol/L (ref 101–111)
CO2: 26 mmol/L (ref 22–32)
Creatinine, Ser: 1.02 mg/dL (ref 0.61–1.24)
GFR calc non Af Amer: >60 mL/min (ref >60)
GLUCOSE: 96 mg/dL (ref 65–99)
POTASSIUM: 3.9 mmol/L (ref 3.5–5.1)
Sodium: 141 mmol/L (ref 135–145)

## 2016-11-12 LAB — CBC
HEMATOCRIT: 37.5 % — AB (ref 39.0–52.0)
HEMOGLOBIN: 12.8 g/dL — AB (ref 13.0–17.0)
MCH: 30.3 pg (ref 26.0–34.0)
MCHC: 34.1 g/dL (ref 30.0–36.0)
MCV: 88.9 fL (ref 78.0–100.0)
Platelets: 195 10*3/uL (ref 150–400)
RBC: 4.22 MIL/uL (ref 4.22–5.81)
RDW: 12.6 % (ref 11.5–15.5)
WBC: 10.4 10*3/uL (ref 4.0–10.5)

## 2016-11-12 MED ORDER — TRAMADOL HCL 50 MG PO TABS: 50.0000 mg | ORAL_TABLET | Freq: Four times a day (QID) | ORAL | 0 refills | Status: DC | PRN

## 2016-11-12 NOTE — Progress Notes (Signed)
Reviewed AVS and discharge summary w/pt and wife. They verbalized understanding and provided teach back. Pt discharged home via [place] in stable condition w/pain controlled, prescription, and all belongings.

## 2016-11-12 NOTE — Discharge Summary (Signed)
Physician Discharge Summary  Patient ID: Andres Wallace MRN: [id number] DOB/AGE: [birth year] 38 y.o.  Admit date: 11/10/2016 Discharge date: 11/12/2016  Admission Diagnoses: Colon cancer  Discharge Diagnoses:  Active Problems:   Colon cancer [facility])   Discharged Condition: good  Hospital Course: Pt admitted after surgery.  His diet was advanced as tolerated.  His bowel function returned on POD 1.  By POD 2 he was tolerating a diet and PO tramadol for pain.  He was in stable condition for discharge.  Consults: None  Significant Diagnostic Studies: labs: cbc, chemistry  Treatments: IV hydration and analgesia: acetaminophen  Discharge Exam: Blood pressure 130/77, pulse 75, temperature 98.1 F (36.7 C), temperature source Oral, resp. rate 18, height 6' (1.829 m), weight 82.6 kg (182 lb), SpO2 99 %. General appearance: alert and cooperative GI: normal findings: soft, non-tender Incision/Wound: clean, dry, intact  Disposition: 01-Home or Self Care   Allergies as of 11/12/2016   No Known Allergies     Medication List    TAKE these medications   LamoTRIgine XR 200 MG Tb24 Take 400 mg by mouth at bedtime.   Melatonin 3 MG Tabs Take 3 mg by mouth at bedtime as needed (for sleep.).   traMADol 50 MG tablet Commonly known as:  ULTRAM Take 1-2 tablets (50-100 mg total) by mouth every 6 (six) hours as needed for moderate pain or severe pain.            Discharge Care Instructions        Start     Ordered   11/12/16 0000  traMADol (ULTRAM) 50 MG tablet  Every 6 hours PRN     11/12/16 0800     Follow-up Information    Leighton Ruff, MD. Schedule an appointment as soon as possible for a visit in 2 week(s).   Specialty:  General Surgery Contact information: [address] [place] [address] [phone]           Signed: Rosario Adie 6/38/9373, 4:28 AM

## 2016-11-18 ENCOUNTER — Ambulatory Visit: Payer: PRIVATE HEALTH INSURANCE | Admitting: Hematology

## 2016-11-22 ENCOUNTER — Telehealth: Payer: Self-pay | Admitting: Hematology

## 2016-11-22 ENCOUNTER — Ambulatory Visit (HOSPITAL_BASED_OUTPATIENT_CLINIC_OR_DEPARTMENT_OTHER): Payer: PRIVATE HEALTH INSURANCE | Admitting: Hematology

## 2016-11-22 ENCOUNTER — Encounter: Payer: Self-pay | Admitting: Hematology

## 2016-11-22 VITALS — BP 154/76 | HR 87 | Temp 98.4°F | Resp 20 | Ht 72.0 in | Wt 172.2 lb

## 2016-11-22 DIAGNOSIS — C19 Malignant neoplasm of rectosigmoid junction: Secondary | ICD-10-CM

## 2016-11-22 DIAGNOSIS — R630 Anorexia: Secondary | ICD-10-CM | POA: Diagnosis not present

## 2016-11-22 DIAGNOSIS — R634 Abnormal weight loss: Secondary | ICD-10-CM | POA: Diagnosis not present

## 2016-11-22 DIAGNOSIS — R569 Unspecified convulsions: Secondary | ICD-10-CM | POA: Diagnosis not present

## 2016-11-22 DIAGNOSIS — C187 Malignant neoplasm of sigmoid colon: Secondary | ICD-10-CM

## 2016-11-22 DIAGNOSIS — Z801 Family history of malignant neoplasm of trachea, bronchus and lung: Secondary | ICD-10-CM | POA: Diagnosis not present

## 2016-11-22 MED ORDER — CAPECITABINE 500 MG PO TABS: 1000.0000 mg/m2 | ORAL_TABLET | Freq: Two times a day (BID) | ORAL | 3 refills | Status: DC

## 2016-11-22 MED ORDER — ONDANSETRON HCL 8 MG PO TABS: 8.0000 mg | ORAL_TABLET | Freq: Two times a day (BID) | ORAL | 1 refills | Status: DC | PRN

## 2016-11-22 MED ORDER — PROCHLORPERAZINE MALEATE 10 MG PO TABS: 10.0000 mg | ORAL_TABLET | Freq: Four times a day (QID) | ORAL | 1 refills | Status: DC | PRN

## 2016-11-22 NOTE — Progress Notes (Signed)
START ON PATHWAY REGIMEN - Colorectal     A cycle is every 21 days:     Capecitabine      Oxaliplatin   **Always confirm dose/schedule in your pharmacy ordering system**    Patient Characteristics: Colon Adjuvant, Stage III, High Risk (T4 or N2) Current evidence of distant metastases<= No AJCC T Category: T3 AJCC N Category: N2a AJCC M Category: M0 AJCC 8 Stage Grouping: IIIB Intent of Therapy: Curative Intent, Discussed with Patient

## 2016-11-22 NOTE — Telephone Encounter (Signed)
Gave avs and calendar for October  °

## 2016-11-22 NOTE — Progress Notes (Addendum)
[place]  [phone] [phone]) [place] Note   Patient Care Team: Andres Flow, MD as PCP - General (Family Medicine) Andres Settle, MD (Gastroenterology) Andres Ruff, MD as Consulting Physician (General Surgery) Andres Merle, MD as Consulting Physician (Hematology) 11/22/2016  Referring physician: Dr. Leighton Wallace  CHIEF COMPLAINTS/PURPOSE OF CONSULTATION:  Invasive adenocarcinoma of the sigmoid colon, pT3N2M0, stage IIIB, grade II or III, MSI stable  Oncology History   Cancer Staging Colon cancer [facility]) Staging form: Colon and Rectum, AJCC 8th Edition - Pathologic stage from 11/10/2016: Stage IIIB (pT3, pN2a, cM0) - Signed by Andres Merle, MD on 11/22/2016       Colon cancer ([place])   10/01/2016 Initial Diagnosis    Colon cancer ([place])      10/01/2016 Procedure    Colonoscopy by Dr. Melina Copa: There is an obviously malignant mass at 28 cm in the sigmoid colon. He was circumferential and the colonoscopy could not be passed through the lesions. Multiple biopsies taken.      10/01/2016 Initial Biopsy    Sigmoid colon mass biopsy showed invasive adenocarcinoma.      10/04/2016 Imaging    CT abdomen/pelvis - no signs of metastatic disease, several enlarged mesenteric lymph nodes      10/13/2016 Imaging    CT chest, abdomen and the pelvis with contrast and showed no evidence of distant recurrence.       10/21/2016 Genetic Testing    The patient had genetic testing due to a personal history of colon cancer at 48.  He was tested for the Common Hereditary Cancer Panel.  The Hereditary Gene Panel offered by [facility] includes sequencing and/or deletion duplication testing of the following 46 genes: APC, ATM, AXIN2, BARD1, BMPR1A, BRCA1, BRCA2, BRIP1, CDH1, CDKN2A (p14ARF), CDKN2A (p16INK4a), CHEK2, CTNNA1, DICER1, EPCAM (Deletion/duplication testing only), GREM1 (promoter region deletion/duplication testing only), KIT, MEN1, MLH1, MSH2, MSH3,  MSH6, MUTYH, NBN, NF1, NHTL1, PALB2, PDGFRA, PMS2, POLD1, POLE, PTEN, RAD50, RAD51C, RAD51D, SDHB, SDHC, SDHD, SMAD4, SMARCA4. STK11, TP53, TSC1, TSC2, and VHL.  The following genes were evaluated for sequence changes only: SDHA and HOXB13 c.251G>A variant only.  Results: No pathogenic mutations identified.  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  The date of this test report is 10/21/2016.       11/10/2016 Pathology Results    Diagnosis- surgical pathology Colon, segmental resection for tumor, rectosigmoid ADENOCARCINOMA OF THE COLON, GRADE 3 (4.5 CM) THE TUMOR INVADES THROUGH THE MUSCULARIS PROPRIA INTO PERICOLONIC SOFT TISSUE (PT3) LYMPHOVASCULAR INVASION IS IDENTIFIED MARGINS OF RESECTION ARE NEGATIVE FOR CARCINOMA METASTATIC ADENOCARCINOMA IN FOUR OF TWENTY-TWO LYMPH NODES (4/22, PN2A)      11/10/2016 Surgery     XI ROBOT ASSISTED SIGMOIDECTOMY  SURGEON:  Surgeon(s): Andres Boston, MD Andres Ruff, MD       05/22/4008 Miscellaneous    Colon cancer MSI-stable        HISTORY OF PRESENTING ILLNESS:  Andres Wallace 36 y.o. male is here because of colorectal cancer. He presents today with his wife, [name]. He was referred by his surgeon Dr. Marcello Wallace for his stage III colon cancer.   Pt reports that 12-14 years ago he was diagnosed with stomach ulcers. He also had a confirmation scope later which confirmed this.  Initially, he presented to his gastroenterologist (Dr Andres Wallace) complaining of a 1-2 month history of rectal bleeding which he describes as maroon colored blood. He does note that  over the Spring or early Summer that he experienced some bright-red blood as well. He also had developed some cramping abdominal pain throughout this time as well with his maroon colored stools. He was on an antibiotic at that time for a non-cancerous mole removal which he thought was the source of his pain. His pain resolved after these  antibiotics, but he reports that it later returned several weeks ago. He had a DRE performed which did confirm the presence of blood and a colonoscopy was subsequently also performed at that time which revealed mass of the sigmoid or descending colon. He had biopsy performed of this which showed invasive adenocarcinoma. His CEA level around this time was 1.7. On 10/04/16 he had a CT A/P performed which showed no signs of metastatic disease, but it did show several mesenteric lymph nodes which were enlarged. The next day on 10/05/16 he had follow-up with Dr Andres Wallace of [facility] who recommended a left hemicolectomy in a minimally invasive fashion. He was also referred to genetics and scheduled to have a CT Chest performed to complete his metastatic workup. On 11/10/16 he underwent left hemicolectomy which was performed without complication. He recovered from this well during his admission and was subsequently discharge on 11/12/16. His genetic screening was performed and was normal. He does not have a FHx of colon cancer. 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 He has one son and two daughters who are all young. His wife currently is out of work to take care of their children and the home, she previously worked as a Marine scientist for [employer].   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   [name] presents today for consult. Overall, he is doing well. He has been recoving from his surgery well. He notes that if he twists his abdomen too quickly  he will have some pain surrounding his incisional site, but otherwise this has not really bothered him. He also notes that he has lost approximately 10-12lbs due to some appetite issues. His appetite has been returning slowly, however.  His energy levels have  been good overall. He denies nausea, vomiting, changes in bowel habits, or any other associated symptoms.   MEDICAL HISTORY:  Past Medical History:  Diagnosis Date  . Colon cancer ([place])   . Family history of lung cancer   . Right inguinal hernia   . Seizures ([place])    3 seizures 2016    SURGICAL HISTORY: Past Surgical History:  Procedure Laterality Date  . APPENDECTOMY  2006  . HERNIA REPAIR    . INGUINAL HERNIA REPAIR  10/15/2011   Procedure: HERNIA REPAIR INGUINAL ADULT;  Surgeon: Odis Hollingshead, MD;  Location: [facility];  Service: General;  Laterality: Right;  with mesh   . left knee arthroscopy     1995  . PROCTOSCOPY  11/10/2016   Procedure: PROCTOSCOPY;  Surgeon: Andres Ruff, MD;  Location: [facility];  Service: General;;  . VASECTOMY  10/15/2011   Procedure: VASECTOMY;  Surgeon: Claybon Jabs, MD;  Location: [facility];  Service: Urology;  Laterality: N/A;    SOCIAL HISTORY: Social History   Social History  . Marital status: Married    Spouse name: N/A  . Number of children: N/A  . Years of  education: N/A   Occupational History  . Not on file.   Social History Main Topics  . Smoking status: Never Smoker  . Smokeless tobacco: Never Used  . Alcohol use No     Comment: ocassional  . Drug use: No  . Sexual activity: Yes   Other Topics Concern  . Not on file   Social History Narrative  . No narrative on file    FAMILY HISTORY: Family History  Problem Relation Age of Onset  . Lung cancer Father 74  . Lung cancer Paternal Grandfather        dx in 67's, died at 20  . Heart attack Maternal Grandmother 58    ALLERGIES:  has No Known Allergies.  MEDICATIONS:  Current Outpatient Prescriptions  Medication Sig Dispense Refill  . LamoTRIgine XR 200 MG TB24 Take 400 mg by mouth at bedtime.  5  . Melatonin 3 MG TABS Take 3 mg by mouth at bedtime as needed (for sleep.).    Marland Kitchen Omega-3 Fatty Acids (FISH OIL) 500 MG CAPS Take 2 capsules by mouth daily.    .  capecitabine (XELODA) 500 MG tablet Take 4 tablets (2,000 mg total) by mouth 2 (two) times daily after a meal. Take on days 1-14 of chemotherapy. 112 tablet 3  . ondansetron (ZOFRAN) 8 MG tablet Take 1 tablet (8 mg total) by mouth 2 (two) times daily as needed for refractory nausea / vomiting. Start on day 3 after chemotherapy. 30 tablet 1  . prochlorperazine (COMPAZINE) 10 MG tablet Take 1 tablet (10 mg total) by mouth every 6 (six) hours as needed (Nausea or vomiting). 30 tablet 1  . traMADol (ULTRAM) 50 MG tablet Take 1-2 tablets (50-100 mg total) by mouth every 6 (six) hours as needed for moderate pain or severe pain. (Patient not taking: Reported on 11/22/2016) 30 tablet 0   No current facility-administered medications for this visit.     REVIEW OF SYSTEMS:   Constitutional: Denies fevers, chills or abnormal night sweats. (+)weight loss Eyes: Denies blurriness of vision, double vision or watery eyes Ears, nose, mouth, throat, and face: Denies mucositis or sore throat Respiratory: Denies cough, dyspnea or wheezes Cardiovascular: Denies palpitation, chest discomfort or lower extremity swelling Gastrointestinal:  Denies nausea, heartburn or change in bowel habits Skin: Denies abnormal skin rashes Lymphatics: Denies new lymphadenopathy or easy bruising Neurological:Denies numbness, tingling or new weaknesses Behavioral/Psych: Mood is stable, no new changes  All other systems were reviewed with the patient and are negative.  PHYSICAL EXAMINATION:  ECOG PERFORMANCE STATUS: 1 - Symptomatic but completely ambulatory  Vitals:   11/22/16 1120  BP: (!) 154/76  Pulse: 87  Resp: 20  Temp: 98.4 F (36.9 C)  SpO2: 100%   Filed Weights   11/22/16 1120  Weight: 172 lb 3.2 oz (78.1 kg)    GENERAL:alert, no distress and comfortable SKIN: skin color, texture, turgor are normal, no rashes or significant lesions EYES: normal, conjunctiva are pink and non-injected, sclera clear OROPHARYNX:no  exudate, no erythema and lips, buccal mucosa, and tongue normal  NECK: supple, thyroid normal size, non-tender, without nodularity LYMPH:  no palpable lymphadenopathy in the cervical, axillary or inguinal LUNGS: clear to auscultation and percussion with normal breathing effort HEART: regular rate & rhythm and no murmurs and no lower extremity edema ABDOMEN:abdomen soft, non-tender and normal bowel sounds. Multiple small surgical incisions have healed well. No discharge or surrounding skin erythema. No organmegaly.  Musculoskeletal:no cyanosis of digits and no clubbing  PSYCH:  alert & oriented x 3 with fluent speech NEURO: no focal motor/sensory deficits  LABORATORY DATA:  I have reviewed the data as listed CBC Latest Ref Rng & Units 11/12/2016 11/11/2016 11/04/2016  WBC 4.0 - 10.5 K/uL 10.4 12.9(H) 8.0  Hemoglobin 13.0 - 17.0 g/dL 12.8(L) 13.3 13.5  Hematocrit 39.0 - 52.0 % 37.5(L) 38.6(L) 39.0  Platelets 150 - 400 K/uL 195 216 194   PATHOLOGY RESULTS:   Diagnosis 11/10/16 Colon, segmental resection for tumor, rectosigmoid ADENOCARCINOMA OF THE COLON, GRADE 3 (4.5 CM) THE TUMOR INVADES THROUGH THE MUSCULARIS PROPRIA INTO PERICOLONIC SOFT TISSUE (PT3) LYMPHOVASCULAR INVASION IS IDENTIFIED MARGINS OF RESECTION ARE NEGATIVE FOR CARCINOMA METASTATIC ADENOCARCINOMA IN FOUR OF TWENTY-TWO LYMPH NODES (4/22, PN2A)  Microscopic Comment COLON AND RECTUM (INCLUDING TRANS-ANAL RESECTION): Specimen: Rectosigmoid colon Procedure: Segmental resection Tumor site: descending colon Specimen integrity: Intact Macroscopic intactness of mesorectum: Not applicable: NA Complete: x Near complete: NA Incomplete: NA Cannot be determined (specify): NA Macroscopic tumor perforation: through the muscularis propria into the subserosal adipose tissue Invasive tumor: Maximum size: 4.5 cm Histologic type(s): Adenocarcinoma Histologic grade and differentiation: G1: well differentiated/low grade G2:  moderately differentiated/low grade G3: poorly differentiated/high grade G4: undifferentiated/high grade Type of polyp in which invasive carcinoma arose: Tubular adenoma Microscopic extension of invasive tumor: The tumor invades through muscularis propria into the subserosal adiposetissue Lymph-Vascular invasion: Identified Peri-neural invasion: Negative Tumor deposit(s) (discontinuous extramural extension): negative Resection margins: Proximal margin: negative Distal margin: Negative Circumferential (radial) (posterior ascending, posterior descending; lateral and posterior mid-rectum; and entire lower 1/3 rectum):negative Mesenteric margin (sigmoid and transverse): Negative Distance closest margin (if all above margins negative): 0.1 cm to radial margin Trans-anal resection margins only: Deep margin: NA Mucosal Margin: NA Distance closest mucosal margin (if negative): NA Treatment effect (neo-adjuvant therapy): Negative Additional polyp(s): Negative Non-neoplastic findings: Unremarkable Lymph nodes: number examined 22; number positive: 4 Pathologic Staging: pT3, pN2a, pMx Ancillary studies: ordered  RADIOGRAPHIC STUDIES: I have personally reviewed the radiological images as listed and agreed with the findings in the report. No results found.  ASSESSMENT: 36 year old Caucasian male with PMH of seizure, presented with recurrent rectal bleeding   1. Left colon cancer of the sigmoid colon, invasive adenocarcinoma, GX,  pT3N2M0, stage IIIB, MSI stable -I reviewed his CT scan findings, and surgical pathology results in great details with patient and his wife. He had several questions which I answered in great detail.  -He had a complete surgical resection with negative margins, the tumor was T3 lesion and 4/22 nodes were positive.  -We discussed the risk of cancer recurrence after surgery. Due to his stage IIIB locally invaded disease, LVI positive, he is at moderate to high risk of  recurrence, approximately at 50%. -He did have genetic testing which showed that he was MSI stable, negative for Lynch syndrome.  -We discussed the standard adjuvant chemotherapy for stage III colon cancer which would reduce the risk of cancer recurrence to approximately 15-25%. -we discussed the regimens for adjuvant chemotherapy, including FOLFOX, CAPOX, or single agent Xeloda or 5-FU. Due to his young age and overall very healthy lifestyle, I recommended him to consider 5-FU or Xeloda with Oxaliplatin. We would schedule this every two weeks. We discussed the duration of adjuvant chemotherapy. Based on the recent reported IDEA study in ASCO 2017, I recommend 3-6 months therapy. Due to his N2 disease, I recommend 6 months of total therapy, however I have low threshold to hold oxaliplatin or shorten the chemo course after 3 months of therapy,  due to the small benefit of 6 vs 3 months therapy.  -After lengthy discussion, patient decided to do CAPOX every 3 weeks for 6 months. We discussed the option of switched to FOLFOX if he has significant side effects from CAPOX.  -The goal of therapy is curative, but no guarantees were made.  --Chemotherapy consent: Side effects including but does not not limited to, fatigue, nausea, vomiting, diarrhea, hair loss, neuropathy, fluid retention, renal and kidney dysfunction, neutropenic fever, needed for blood transfusion, bleeding, coronary artery spasm and heart attack, were discussed with patient in great detail. He agrees to proceed. -We will have perform restaging scans following chemotherapy to evaluate the progression of his disease, and we will have serial scans follow this.  -He has been recovering very well from his surgery, I will revaluate him in 2-3 weeks, and probably start him on chemotherapy in mid to late October.  -He will speak with Dr Andres Wallace about port placement at his next follow up with her at the end of this week.  -We will schedule him for chemo  class in the next few weeks before he begins his chemotherapy.  -I also encouraged him to maintain his healthy diet, exercise regularly as he is able, and to try and stay positive as these will all help with his immune system throughout his treatments.  -I also encouraged him to have his influenza vaccination as soon as possible before his treatment begins.  --I discussed the risk of cancer recurrence in the future. I discussed the surveillance plan, which is a physical exam and lab test (including CBC, CMP and CEA) every 3-4 months for the first 2 years, then every 6-12 months, colonoscopy in one year, and surveilliance CT scan every 6-12 month for up to 5 year.  -we also reviewed the role of palliative therapy, immunotherapy, and a clinical trials. Questions were answered    2. Weight loss and decreased appetite -I encouraged him to eat more if he is able. We will refer him to our dietician service to assist him with this. We will consider supplements in the future to help with this following chemotherapy.   3. History of seizure  -Continue medication and a follow-up with his neurologist at [facility].  PLAN:  -chemo class in 1-2 weeks -Lab, flush, f/u and chemo Oxaliplatin on 10/8 and 10/29  -I sent the prescription of Xeloda to 7 mg twice daily, on day 1-14, every 21 days, to our oral pharmacy today -port placement by Dr. Marcello Wallace  -nutrition consult   No orders of the defined types were placed in this encounter.   Pt and his wife have read a lot about colon cancer online, and had many questions, which were all answered to the best of my knowledge. The patient knows to call the clinic with any problems, questions or concerns.  I spent 70 minutes counseling the patient face to face. The total time spent in the appointment was 90 minutes and more than 50% was on counseling.   This document serves as a record of services personally performed by Andres Merle, MD. It was created on her behalf by  Reola Mosher, a trained medical scribe. The creation of this record is based on the scribe's personal observations and the provider's statements to them. This document has been checked and approved by the attending provider.   Andres Merle, MD 11/22/2016 6:46 PM

## 2016-11-22 NOTE — Progress Notes (Signed)
Met with patient and wife, Andres Wallace, prior to and during appointment with Dr. Burr Medico.  I reviewed my role as GI Navigator and explained that I would be a resource for them throughout patient's treatment. Patient agreed to call with questions or concerns. Plan: I will call patient later in the week to review information that was presented today during his initial med-onc appointment.

## 2016-11-23 ENCOUNTER — Encounter (HOSPITAL_COMMUNITY): Payer: Self-pay | Admitting: General Surgery

## 2016-11-23 ENCOUNTER — Ambulatory Visit: Payer: Self-pay | Admitting: General Surgery

## 2016-11-23 NOTE — Addendum Note (Signed)
Addendum  created 11/23/16 0731 by Nolon Nations, MD   Anesthesia Event edited, Anesthesia Staff edited

## 2016-11-24 ENCOUNTER — Telehealth: Payer: Self-pay | Admitting: Pharmacist

## 2016-11-24 ENCOUNTER — Encounter: Payer: Self-pay | Admitting: Pharmacist

## 2016-11-24 ENCOUNTER — Encounter (HOSPITAL_COMMUNITY): Payer: Self-pay | Admitting: *Deleted

## 2016-11-24 ENCOUNTER — Encounter: Payer: Self-pay | Admitting: Medical Oncology

## 2016-11-24 DIAGNOSIS — C187 Malignant neoplasm of sigmoid colon: Secondary | ICD-10-CM

## 2016-11-24 MED ORDER — CAPECITABINE 500 MG PO TABS: 1000.0000 mg/m2 | ORAL_TABLET | Freq: Two times a day (BID) | ORAL | 3 refills | Status: DC

## 2016-11-24 NOTE — Telephone Encounter (Signed)
Erroneous Encounter

## 2016-11-24 NOTE — Telephone Encounter (Signed)
Oral Oncology Pharmacist Encounter  Received new prescription for Xeloda for the treatment of Stage IIIB colon cancer in conjunction with oxaliplatin (CAPOX), planned duration until disease progression or unacceptable drug toxicity.  CBC/CMP from 11/12/16 assessed, no relevant lab abnormalities. Prescription dose and frequency assessed.   Current medication list in Epic reviewed, no relevant DDIs with Xeloda identified.  Prescription has been e-scribed to the [facility] for benefits analysis and approval.  Oral Oncology Clinic will continue to follow for insurance authorization, copayment issues, initial counseling and start date.  Darl Pikes, PharmD, BCPS Hematology/Oncology Clinical Pharmacist [facility] [phone]  11/24/2016 8:42 AM

## 2016-11-24 NOTE — Progress Notes (Signed)
  Oncology Nurse Navigator Documentation  Navigator Location: [facility] (11/24/16 1000)   )Navigator Encounter Type: Telephone (11/24/16 1000) Telephone: Lahoma Crocker Call;Appt Confirmation/Clarification (11/24/16 1000)  I called patient to see if he had any questions or concerns and to review upcoming appointments. Patient prepared for Chemo Education class on 11/26/16 and for port insertion on 12/01/16. I encouraged patient to call with questions or concerns.     Genetic Counseling Date: 10/12/16 (11/24/16 1000) Genetic Counseling Type: Urgent (11/24/16 1000)                 Interventions: Psycho-social support (11/24/16 1000)            Acuity: Level 1 (11/24/16 1000)   Acuity Level 2: Ongoing guidance and education throughout treatment as needed (11/24/16 1000)     Time Spent with Patient: 15 (11/24/16 1000)

## 2016-11-26 ENCOUNTER — Ambulatory Visit: Payer: Self-pay

## 2016-11-29 ENCOUNTER — Encounter: Payer: Self-pay | Admitting: Pharmacist

## 2016-11-29 MED FILL — CAPECITABINE 500 MG TABLET: 500 | 21 days supply | Qty: 112 | Fill #0

## 2016-12-01 ENCOUNTER — Encounter (HOSPITAL_COMMUNITY)
Admission: RE | Disposition: A | Discharge status: Home / Self Care | Payer: Self-pay | Source: Ambulatory Visit | Attending: General Surgery

## 2016-12-01 ENCOUNTER — Ambulatory Visit (HOSPITAL_COMMUNITY): Payer: Self-pay | Admitting: Certified Registered Nurse Anesthetist

## 2016-12-01 ENCOUNTER — Encounter (HOSPITAL_COMMUNITY): Payer: Self-pay | Admitting: Emergency Medicine

## 2016-12-01 ENCOUNTER — Ambulatory Visit (HOSPITAL_COMMUNITY): Payer: Self-pay

## 2016-12-01 ENCOUNTER — Telehealth: Payer: Self-pay | Admitting: Hematology

## 2016-12-01 ENCOUNTER — Ambulatory Visit (HOSPITAL_COMMUNITY)
Admission: RE | Admit: 2016-12-01 | Discharge: 2016-12-01 | Disposition: A | Discharge status: Home / Self Care | Payer: Self-pay | Source: Ambulatory Visit | Attending: General Surgery | Admitting: General Surgery

## 2016-12-01 DIAGNOSIS — G40909 Epilepsy, unspecified, not intractable, without status epilepticus: Secondary | ICD-10-CM | POA: Insufficient documentation

## 2016-12-01 DIAGNOSIS — C186 Malignant neoplasm of descending colon: Secondary | ICD-10-CM | POA: Insufficient documentation

## 2016-12-01 DIAGNOSIS — Z95828 Presence of other vascular implants and grafts: Secondary | ICD-10-CM

## 2016-12-01 HISTORY — PX: PORTACATH PLACEMENT: SHX2246

## 2016-12-01 SURGERY — INSERTION, TUNNELED CENTRAL VENOUS DEVICE, WITH PORT
Anesthesia: General | Site: Chest | Laterality: Right

## 2016-12-01 MED ORDER — ACETAMINOPHEN 650 MG RE SUPP
650.0000 mg | RECTAL | Status: DC | PRN
  Filled 2016-12-01: qty 1

## 2016-12-01 MED ORDER — MEPERIDINE HCL 50 MG/ML IJ SOLN
INTRAMUSCULAR | Status: AC
  Administered 2016-12-01: 12.5 mg via INTRAVENOUS
  Filled 2016-12-01: qty 1

## 2016-12-01 MED ORDER — SODIUM CHLORIDE 0.9 % IV SOLN: 250.0000 mL | INTRAVENOUS | Status: DC | PRN

## 2016-12-01 MED ORDER — SODIUM CHLORIDE 0.9 % IV SOLN
Freq: Once | INTRAVENOUS | Status: AC
  Administered 2016-12-01: 500 mL
  Filled 2016-12-01: qty 1.2

## 2016-12-01 MED ORDER — BUPIVACAINE-EPINEPHRINE 0.25% -1:200000 IJ SOLN
INTRAMUSCULAR | Status: DC | PRN
  Administered 2016-12-01: 20 mL

## 2016-12-01 MED ORDER — ACETAMINOPHEN 500 MG PO TABS
1000.0000 mg | ORAL_TABLET | ORAL | Status: AC
  Administered 2016-12-01: 1000 mg via ORAL

## 2016-12-01 MED ORDER — LIDOCAINE 2% (20 MG/ML) 5 ML SYRINGE
INTRAMUSCULAR | Status: DC | PRN
  Administered 2016-12-01: 100 mg via INTRAVENOUS

## 2016-12-01 MED ORDER — MEPERIDINE HCL 50 MG/ML IJ SOLN
6.2500 mg | INTRAMUSCULAR | Status: DC | PRN
  Administered 2016-12-01: 12.5 mg via INTRAVENOUS

## 2016-12-01 MED ORDER — LACTATED RINGERS IV SOLN
INTRAVENOUS | Status: DC
  Administered 2016-12-01 (×2): via INTRAVENOUS

## 2016-12-01 MED ORDER — PROPOFOL 10 MG/ML IV BOLUS
INTRAVENOUS | Status: AC
  Filled 2016-12-01: qty 20

## 2016-12-01 MED ORDER — LIDOCAINE 2% (20 MG/ML) 5 ML SYRINGE
INTRAMUSCULAR | Status: AC
  Filled 2016-12-01: qty 5

## 2016-12-01 MED ORDER — SODIUM CHLORIDE 0.9% FLUSH: 3.0000 mL | Freq: Two times a day (BID) | INTRAVENOUS | Status: DC

## 2016-12-01 MED ORDER — ONDANSETRON HCL 4 MG/2ML IJ SOLN
INTRAMUSCULAR | Status: DC | PRN
  Administered 2016-12-01: 4 mg via INTRAVENOUS

## 2016-12-01 MED ORDER — ACETAMINOPHEN 500 MG PO TABS
1000.0000 mg | ORAL_TABLET | ORAL | Status: DC
  Filled 2016-12-01: qty 2

## 2016-12-01 MED ORDER — SODIUM CHLORIDE 0.9% FLUSH: 3.0000 mL | INTRAVENOUS | Status: DC | PRN

## 2016-12-01 MED ORDER — FENTANYL CITRATE (PF) 100 MCG/2ML IJ SOLN
INTRAMUSCULAR | Status: AC
  Filled 2016-12-01: qty 2

## 2016-12-01 MED ORDER — ONDANSETRON HCL 4 MG/2ML IJ SOLN
INTRAMUSCULAR | Status: AC
  Filled 2016-12-01: qty 2

## 2016-12-01 MED ORDER — HEPARIN SOD (PORK) LOCK FLUSH 100 UNIT/ML IV SOLN
INTRAVENOUS | Status: AC
  Filled 2016-12-01: qty 5

## 2016-12-01 MED ORDER — FENTANYL CITRATE (PF) 100 MCG/2ML IJ SOLN
INTRAMUSCULAR | Status: DC | PRN
  Administered 2016-12-01: 50 ug via INTRAVENOUS

## 2016-12-01 MED ORDER — MIDAZOLAM HCL 2 MG/2ML IJ SOLN
INTRAMUSCULAR | Status: AC
  Filled 2016-12-01: qty 2

## 2016-12-01 MED ORDER — CELECOXIB 200 MG PO CAPS
200.0000 mg | ORAL_CAPSULE | ORAL | Status: DC
  Filled 2016-12-01: qty 1

## 2016-12-01 MED ORDER — OXYCODONE HCL 5 MG PO TABS: 5.0000 mg | ORAL_TABLET | ORAL | Status: DC | PRN

## 2016-12-01 MED ORDER — PROPOFOL 10 MG/ML IV BOLUS
INTRAVENOUS | Status: DC | PRN
  Administered 2016-12-01: 200 mg via INTRAVENOUS

## 2016-12-01 MED ORDER — ACETAMINOPHEN 325 MG PO TABS: 650.0000 mg | ORAL_TABLET | ORAL | Status: DC | PRN

## 2016-12-01 MED ORDER — FENTANYL CITRATE (PF) 100 MCG/2ML IJ SOLN
INTRAMUSCULAR | Status: AC
  Administered 2016-12-01: 50 ug via INTRAVENOUS
  Filled 2016-12-01: qty 2

## 2016-12-01 MED ORDER — MIDAZOLAM HCL 5 MG/5ML IJ SOLN
INTRAMUSCULAR | Status: DC | PRN
  Administered 2016-12-01: 2 mg via INTRAVENOUS

## 2016-12-01 MED ORDER — CEFAZOLIN SODIUM-DEXTROSE 2-4 GM/100ML-% IV SOLN
2.0000 g | INTRAVENOUS | Status: DC
  Filled 2016-12-01: qty 100

## 2016-12-01 MED ORDER — HEPARIN SOD (PORK) LOCK FLUSH 100 UNIT/ML IV SOLN
INTRAVENOUS | Status: DC | PRN
  Administered 2016-12-01: 500 [IU]

## 2016-12-01 MED ORDER — PROMETHAZINE HCL 25 MG/ML IJ SOLN: 6.2500 mg | INTRAMUSCULAR | Status: DC | PRN

## 2016-12-01 MED ORDER — 0.9 % SODIUM CHLORIDE (POUR BTL) OPTIME
TOPICAL | Status: DC | PRN
  Administered 2016-12-01: 1000 mL

## 2016-12-01 MED ORDER — CEFAZOLIN SODIUM-DEXTROSE 2-4 GM/100ML-% IV SOLN
2.0000 g | INTRAVENOUS | Status: AC
  Administered 2016-12-01: 2 g via INTRAVENOUS

## 2016-12-01 MED ORDER — FENTANYL CITRATE (PF) 100 MCG/2ML IJ SOLN
25.0000 ug | INTRAMUSCULAR | Status: DC | PRN
  Administered 2016-12-01: 50 ug via INTRAVENOUS

## 2016-12-01 MED ORDER — BUPIVACAINE-EPINEPHRINE (PF) 0.25% -1:200000 IJ SOLN
INTRAMUSCULAR | Status: AC
  Filled 2016-12-01: qty 30

## 2016-12-01 MED ORDER — CELECOXIB 200 MG PO CAPS
200.0000 mg | ORAL_CAPSULE | ORAL | Status: AC
  Administered 2016-12-01: 200 mg via ORAL

## 2016-12-01 SURGICAL SUPPLY — 29 items
BAG DECANTER FOR FLEXI CONT (MISCELLANEOUS) ×3 IMPLANT
BLADE SURG SZ11 CARB STEEL (BLADE) ×3 IMPLANT
CHLORAPREP W/TINT 26ML (MISCELLANEOUS) ×3 IMPLANT
DECANTER SPIKE VIAL GLASS SM (MISCELLANEOUS) ×3 IMPLANT
DERMABOND ADVANCED (GAUZE/BANDAGES/DRESSINGS) ×4
DERMABOND ADVANCED .7 DNX12 (GAUZE/BANDAGES/DRESSINGS) ×2 IMPLANT
DRAPE C-ARM 42X120 X-RAY (DRAPES) ×3 IMPLANT
DRAPE LAPAROTOMY TRNSV 102X78 (DRAPE) ×3 IMPLANT
ELECT PENCIL ROCKER SW 15FT (MISCELLANEOUS) ×3 IMPLANT
ELECT REM PT RETURN 15FT ADLT (MISCELLANEOUS) ×3 IMPLANT
GAUZE SPONGE 4X4 16PLY XRAY LF (GAUZE/BANDAGES/DRESSINGS) ×3 IMPLANT
GLOVE BIO SURGEON STRL SZ 6.5 (GLOVE) ×2 IMPLANT
GLOVE BIO SURGEONS STRL SZ 6.5 (GLOVE) ×1
GLOVE BIOGEL PI IND STRL 7.0 (GLOVE) ×1 IMPLANT
GLOVE BIOGEL PI INDICATOR 7.0 (GLOVE) ×2
GOWN STRL REUS W/TWL 2XL LVL3 (GOWN DISPOSABLE) ×3 IMPLANT
GOWN STRL REUS W/TWL XL LVL3 (GOWN DISPOSABLE) ×3 IMPLANT
KIT BASIN OR (CUSTOM PROCEDURE TRAY) ×3 IMPLANT
KIT PORT POWER 8FR ISP CVUE (Miscellaneous) ×3 IMPLANT
NEEDLE HYPO 25X1 1.5 SAFETY (NEEDLE) ×3 IMPLANT
PACK BASIC VI WITH GOWN DISP (CUSTOM PROCEDURE TRAY) ×3 IMPLANT
SUT PROLENE 2 0 SH DA (SUTURE) ×3 IMPLANT
SUT VIC AB 3-0 SH 27 (SUTURE) ×2
SUT VIC AB 3-0 SH 27XBRD (SUTURE) ×1 IMPLANT
SUT VIC AB 4-0 PS2 18 (SUTURE) ×3 IMPLANT
SYR 10ML LL (SYRINGE) ×3 IMPLANT
SYR CONTROL 10ML LL (SYRINGE) ×3 IMPLANT
TOWEL OR 17X26 10 PK STRL BLUE (TOWEL DISPOSABLE) ×3 IMPLANT
TOWEL OR NON WOVEN STRL DISP B (DISPOSABLE) ×3 IMPLANT

## 2016-12-01 NOTE — Interval H&P Note (Signed)
History and Physical Interval Note:  12/01/2016 1:53 PM  Andres Wallace  has presented today for surgery, with the diagnosis of stage 3 colon cancer  The various methods of treatment have been discussed with the patient and family. After consideration of risks, benefits and other options for treatment, the patient has consented to  Procedure(s): INSERTION PORT-A-CATH (N/A) as a surgical intervention .  The patient's history has been reviewed, patient examined, no change in status, stable for surgery.  I have reviewed the patient's chart and labs.  Questions were answered to the patient's satisfaction.  Pt with stage 3 colon cancer.  He has decided to proceed with adjuvant chemotherapy.  Risks of bleeding, infection, port malfunction and need for additional procedures explained to the patient.  He agrees to proceed.    Rosario Adie, MD  Colorectal and [place] [facility]

## 2016-12-01 NOTE — Anesthesia Postprocedure Evaluation (Signed)
Anesthesia Post Note  Patient: Andres Wallace  Procedure(s) Performed: INSERTION PORT-A-CATH (Right Chest)     Patient location during evaluation: PACU Anesthesia Type: General Level of consciousness: awake and alert Pain management: pain level controlled Vital Signs Assessment: post-procedure vital signs reviewed and stable Respiratory status: spontaneous breathing, nonlabored ventilation, respiratory function stable and patient connected to nasal cannula oxygen Cardiovascular status: blood pressure returned to baseline and stable Postop Assessment: no apparent nausea or vomiting Anesthetic complications: no    Last Vitals:  Vitals:   12/01/16 1615 12/01/16 1630  BP: 138/69 129/67  Pulse: (!) 58 (!) 57  Resp: 13 13  Temp:  36.5 C  SpO2: 98% 97%    Last Pain:  Vitals:   12/01/16 1630  TempSrc:   PainSc: 1                  Tiajuana Amass

## 2016-12-01 NOTE — Anesthesia Preprocedure Evaluation (Signed)
Anesthesia Evaluation  Patient identified by MRN, date of birth, ID band Patient awake    Reviewed: Allergy & Precautions, H&P , NPO status , Patient's Chart, lab work & pertinent test results  Airway Mallampati: II  TM Distance: >3 FB Neck ROM: Full    Dental no notable dental hx. (+) Dental Advisory Given   Pulmonary neg pulmonary ROS,    Pulmonary exam normal breath sounds clear to auscultation       Cardiovascular negative cardio ROS Normal cardiovascular exam Rhythm:Regular Rate:Normal     Neuro/Psych negative neurological ROS  negative psych ROS   GI/Hepatic Neg liver ROS, Colon CA   Endo/Other  negative endocrine ROS  Renal/GU negative Renal ROS     Musculoskeletal negative musculoskeletal ROS (+)   Abdominal   Peds  Hematology negative hematology ROS (+)   Anesthesia Other Findings   Reproductive/Obstetrics                             Anesthesia Physical  Anesthesia Plan  ASA: II  Anesthesia Plan: General   Post-op Pain Management:    Induction: Intravenous  PONV Risk Score and Plan: 3 and Ondansetron, Dexamethasone and Midazolam  Airway Management Planned: LMA  Additional Equipment:   Intra-op Plan:   Post-operative Plan: Extubation in OR  Informed Consent: I have reviewed the patients History and Physical, chart, labs and discussed the procedure including the risks, benefits and alternatives for the proposed anesthesia with the patient or authorized representative who has indicated his/her understanding and acceptance.   Dental advisory given  Plan Discussed with: CRNA  Anesthesia Plan Comments:         Anesthesia Quick Evaluation

## 2016-12-01 NOTE — Transfer of Care (Signed)
Immediate Anesthesia Transfer of Care Note  Patient: Andres Wallace  Procedure(s) Performed: INSERTION PORT-A-CATH (Right Chest)  Patient Location: PACU  Anesthesia Type:General  Level of Consciousness: awake, alert  and oriented  Airway & Oxygen Therapy: Patient Spontanous Breathing and Patient connected to face mask oxygen  Post-op Assessment: Report given to RN and Post -op Vital signs reviewed and stable  Post vital signs: Reviewed and stable  Last Vitals:  Vitals:   12/01/16 1318  BP: 123/67  Pulse: 77  Resp: 16  Temp: 36.7 C  SpO2: 98%    Last Pain:  Vitals:   12/01/16 1340  TempSrc:   PainSc: 0-No pain      Patients Stated Pain Goal: 4 (56/[id number]/94 [id number])  Complications: No apparent anesthesia complications

## 2016-12-01 NOTE — Op Note (Signed)
12/01/2016  3:43 PM  PATIENT:  Andres Wallace  36 y.o. male  Patient Care Team: [name], MD as PCP - General (Family Medicine) Nehemiah Settle, MD (Gastroenterology) Leighton Ruff, MD as Consulting Physician (General Surgery) Truitt Merle, MD as Consulting Physician (Hematology)  PRE-OPERATIVE DIAGNOSIS:  stage 3 colon cancer  POST-OPERATIVE DIAGNOSIS:  stage 3 colon cancer  PROCEDURE:  Procedure(s): INSERTION PORT-A-CATH  SURGEON:  Surgeon(s): Leighton Ruff, MD  ANESTHESIA:   local and MAC  EBL:  No intake/output data recorded.  DISPOSITION OF SPECIMEN:  N/A  COUNTS:  YES  PLAN OF CARE: Discharge to home after PACU  PATIENT DISPOSITION:  PACU - hemodynamically stable.  INDICATION: Patient with need for IV chemotherapy. Port-A-Cath placement was requested.   Use of a central venous catheter for intravenous therapy was discussed. Technique of catheter placement using ultrasound and fluoroscopy guidance was discussed. Risks such as bleeding, infection, pneumothorax, catheter occlusion, reoperation, and other risks were discussed. I noted a good likelihood this will help address the problem. Questions were answered. The patient expressed understanding & wishes to proceed.   Findings: Normal-appearing anatomy.   8 [place] power port. It goes through the right internal jugular vein   Procedure: Informed consent was confirmed. Patient was brought the operating room and positioned supine. Arms were tucked. The patient underwent deep sedation. Neck and chest were clipped and prepped and draped in a sterile fashion. A surgical timeout confirmed our plan. I placed a field block of local anesthesia on the chest  I entered into the right internal jugular on the first venipuncture using US guidance. Non-pulsatile blood was returned. Wire was easily passed into the inferior vena cava and confirmed by fluoroscopy. I confirmed placement of the wire in the right side of the chest.  I  made an incision in the lateral infraclavicular area and made a subcutaneous pocket. I used a dilator on the wire using Seldinger technique to dilate the tract under fluoroscopy. I placed the catheter into the sheath. I then peeled away the dilator sheath. I tunneled the power port from the puncture site to the chest pocket. I cut the catheter to appropriate length and attached it to the port using the plastic connector. The port was placed into the pocket and secured to the left anterior chest wall using 2-0 Prolene interrupted stitches x2. Catheter flushed well.  Fluoroscopy confirmed the tip in the distal SVC. Catheter aspirated and flushed well. On final fluoro reevaluation the tip seen to be in good position in the distal SVC.  I closed the wounds using 3-0 Vicryl interrupted sutures for the pocket and 4-0 Monocryl stitch was used to close the skin. Dermabond was used on the 2 incisions. CXR will be performed in PACU. Patient should go home later today. Catheter is okay to use.

## 2016-12-01 NOTE — H&P (View-Only) (Signed)
The patient is a 36 year old male who presents with colorectal cancer. 36 year old male who presented to the office for a newly diagnosed colon cancer. He has had 1-2 months of rectal bleeding. He also developed some crampy abdominal pain several weeks ago. He was seen by his gastroenterologist and colonoscopy was performed. This showed a mass in his sigmoid or descending colon. Biopsy showed invasive adenocarcinoma. CEA level was 1.7. CT scan of the abdomen and pelvis was done yesterday. This showed no signs of metastatic disease but several mesenteric lymph nodes were enlarged.  CT chest was unremarkable as well. Patient is now asymptomatic.   Past Surgical History  Appendectomy Open Inguinal Hernia Surgery Right. Vasectomy  Diagnostic Studies History  Colonoscopy within last year  Allergies No Known Allergies 10/05/2016  Medication History  LamoTRIgine ER (200MG  Tablet ER 24HR, Oral) Active. Medications Reconciled  Social History  Alcohol use Occasional alcohol use. Caffeine use Carbonated beverages. No drug use Tobacco use Never smoker.  Family History  Respiratory Condition Father.  Other Problems  Colon Cancer Inguinal Hernia Seizure Disorder     Review of Systems  General Not Present- Appetite Loss, Chills, Fatigue, Fever, Night Sweats, Weight Gain and Weight Loss. Skin Not Present- Change in Wart/Mole, Dryness, Hives, Jaundice, New Lesions, Non-Healing Wounds, Rash and Ulcer. HEENT Not Present- Earache, Hearing Loss, Hoarseness, Nose Bleed, Oral Ulcers, Ringing in the Ears, Seasonal Allergies, Sinus Pain, Sore Throat, Visual Disturbances, Wears glasses/contact lenses and Yellow Eyes. Respiratory Not Present- Bloody sputum, Chronic Cough, Difficulty Breathing, Snoring and Wheezing. Breast Not Present- Breast Mass, Breast Pain, Nipple Discharge and Skin Changes. Cardiovascular Not Present- Chest Pain, Difficulty Breathing Lying  Down, Leg Cramps, Palpitations, Rapid Heart Rate, Shortness of Breath and Swelling of Extremities. Gastrointestinal Present- Abdominal Pain and Bloody Stool. Not Present- Bloating, Change in Bowel Habits, Chronic diarrhea, Constipation, Difficulty Swallowing, Excessive gas, Gets full quickly at meals, Hemorrhoids, Indigestion, Nausea, Rectal Pain and Vomiting. Male Genitourinary Not Present- Blood in Urine, Change in Urinary Stream, Frequency, Impotence, Nocturia, Painful Urination, Urgency and Urine Leakage. Musculoskeletal Not Present- Back Pain, Joint Pain, Joint Stiffness, Muscle Pain, Muscle Weakness and Swelling of Extremities. Neurological Not Present- Decreased Memory, Fainting, Headaches, Numbness, Seizures, Tingling, Tremor, Trouble walking and Weakness. Psychiatric Not Present- Anxiety, Bipolar, Change in Sleep Pattern, Depression, Fearful and Frequent crying. Endocrine Not Present- Cold Intolerance, Excessive Hunger, Hair Changes, Heat Intolerance, Hot flashes and New Diabetes. Hematology Not Present- Blood Thinners, Easy Bruising, Excessive bleeding, Gland problems, HIV and Persistent Infections.  BP 124/82   Pulse 85   Temp 98.3 F (36.8 C) (Oral)   Resp 18   Ht 6' (1.829 m)   Wt 82.6 kg (182 lb)   SpO2 100%   BMI 24.68 kg/m    Physical Exam   General Mental Status-Alert. General Appearance-Not in acute distress. Build & Nutrition-Well nourished. Posture-Normal posture. Gait-Normal.  Head and Neck Head-normocephalic, atraumatic with no lesions or palpable masses. Trachea-midline.  Chest and Lung Exam Chest and lung exam reveals -on auscultation, normal breath sounds, no adventitious sounds and normal vocal resonance.  Cardiovascular Cardiovascular examination reveals -normal heart sounds, regular rate and rhythm with no murmurs and no digital clubbing, cyanosis, edema, increased warmth or tenderness.  Abdomen Inspection Inspection of the  abdomen reveals - No Hernias. Palpation/Percussion Palpation and Percussion of the abdomen reveal - Soft, Non Tender, No Rigidity (guarding) and No hepatosplenomegaly. Abdominal Mass Palpable - Location - Left Lower Quadrant.  Neurologic Neurologic evaluation reveals -alert and  oriented x 3 with no impairment of recent or remote memory, normal attention span and ability to concentrate, normal sensation and normal coordination.  Musculoskeletal Normal Exam - Bilateral-Upper Extremity Strength Normal and Lower Extremity Strength Normal.    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 There is currently no signs of metastatic disease. I have recommended a left hemicolectomy. We will try to do this in a minimally invasive fashion. I recommended that he use MiraLAX and a low residue diet in the meantime to help avoid any obstructive symptoms. I referred him to the genetics office due to his young age. Testing revealed no known genetic markers.  The surgery and anatomy were described to the patient as well as the risks of surgery and the possible complications. These include: Bleeding, deep abdominal infections and possible wound complications such as hernia and infection, damage to adjacent structures, leak of surgical connections, which can lead to other surgeries and possibly an ostomy, possible need for other procedures, such as abscess drains in radiology, possible prolonged hospital stay, possible diarrhea from removal of part of the colon, possible constipation from narcotics, possible bowel, bladder or sexual dysfunction if having rectal surgery, prolonged fatigue/weakness or appetite loss, possible early recurrence of of disease, possible complications of their medical problems such as heart disease or arrhythmias or lung problems, death (less than 1%).  I believe the patient understands and wishes to proceed with the surgery.

## 2016-12-01 NOTE — Telephone Encounter (Signed)
Readjusted appts per YF request - left message for patient with appt date and time.

## 2016-12-01 NOTE — Anesthesia Procedure Notes (Signed)
Procedure Name: LMA Insertion Date/Time: 12/01/2016 2:52 PM Performed by: [place] Sriman Tally C Pre-anesthesia Checklist: Patient identified, Emergency Drugs available, Suction available and Patient being monitored Patient Re-evaluated:Patient Re-evaluated prior to induction Oxygen Delivery Method: Circle system utilized Preoxygenation: Pre-oxygenation with 100% oxygen Induction Type: IV induction Ventilation: Mask ventilation without difficulty LMA: LMA inserted LMA Size: 4.0 Number of attempts: 1 Airway Equipment and Method: Bite block Placement Confirmation: positive ETCO2 Tube secured with: Tape Dental Injury: Teeth and Oropharynx as per pre-operative assessment

## 2016-12-01 NOTE — Discharge Instructions (Signed)
    PORT-A-CATH: POST OP INSTRUCTIONS  Always review your discharge instruction sheet given to you by the facility where your surgery was performed.   1. A prescription for pain medication may be given to you upon discharge. Take your pain medication as prescribed, if needed. If narcotic pain medicine is not needed, then you make take acetaminophen (Tylenol) or ibuprofen (Advil) as needed.  2. Take your usually prescribed medications unless otherwise directed. 3. If you need a refill on your pain medication, please contact our office. All narcotic pain medicine now requires a paper prescription.  Phoned in and fax refills are no longer allowed by law.  Prescriptions will not be filled after 5 pm or on weekends.  4. You should follow a light diet for the remainder of the day after your procedure. 5. Most patients will experience some mild swelling and/or bruising in the area of the incision. It may take several days to resolve. 6. It is common to experience some constipation if taking pain medication after surgery. Increasing fluid intake and taking a stool softener (such as Colace) will usually help or prevent this problem from occurring. A mild laxative (Milk of Magnesia or Miralax) should be taken according to package directions if there are no bowel movements after 48 hours.  7. Unless discharge instructions indicate otherwise, you may remove your bandages 48 hours after surgery, and you may shower at that time. You may have steri-strips (small white skin tapes) in place directly over the incision.  These strips should be left on the skin for 7-10 days.  If your surgeon used Dermabond (skin glue) on the incision, you may shower in 24 hours.  The glue will flake off over the next 2-3 weeks.  8. If your port is left accessed at the end of surgery (needle left in port), the dressing cannot get wet and should only by changed by a healthcare professional. When the port is no longer accessed (when the  needle has been removed), follow step 7.   9. ACTIVITIES:  Limit activity involving your arms for the next 72 hours. Do no strenuous exercise or activity for 1 week. You may drive when you are no longer taking prescription pain medication, you can comfortably wear a seatbelt, and you can maneuver your car. 10.You may need to see your doctor in the office for a follow-up appointment.  Please       check with your doctor.  11.When you receive a new Port-a-Cath, you will get a product guide and        ID card.  Please keep them in case you need them.  WHEN TO CALL YOUR DOCTOR ([phone]): 1. Fever over 101.0 2. Chills 3. Continued bleeding from incision 4. Increased redness and tenderness at the site 5. Shortness of breath, difficulty breathing   The clinic staff is available to answer your questions during regular business hours. Please don't hesitate to call and ask to speak to one of the nurses or medical assistants for clinical concerns. If you have a medical emergency, go to the nearest emergency room or call 911.  A surgeon from [facility] is always on call at the hospital.     For further information, please visit [email or web address]      

## 2016-12-02 ENCOUNTER — Encounter: Payer: Self-pay | Admitting: Pharmacist

## 2016-12-02 ENCOUNTER — Telehealth (HOSPITAL_COMMUNITY): Payer: Self-pay | Admitting: *Deleted

## 2016-12-03 ENCOUNTER — Encounter: Payer: Self-pay | Admitting: Hematology

## 2016-12-03 NOTE — Progress Notes (Signed)
Called pt to introduce myself as his Arboriculturist and to discuss Medicaid, financial assistance and the [employer].  Left msg requesting he return my call at his earliest convenience.

## 2016-12-03 NOTE — Progress Notes (Addendum)
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 Melina Copa: There is an obviously malignant mass at 28 cm in the sigmoid colon. He was circumferential and the colonoscopy could not be passed through the lesions. Multiple biopsies taken.      10/01/2016 Initial Biopsy    Sigmoid colon mass biopsy showed invasive adenocarcinoma.      10/04/2016 Imaging    CT abdomen/pelvis - no signs of metastatic disease, several enlarged mesenteric lymph nodes      10/13/2016 Imaging    CT chest, abdomen and the pelvis with contrast and showed no evidence of distant recurrence.       10/21/2016 Genetic Testing    The patient had genetic testing due to a personal history of colon cancer at 58.  He was tested for the Common Hereditary Cancer Panel.  The Hereditary Gene Panel offered by [facility] includes sequencing and/or deletion duplication testing of the following 46 genes: APC, ATM, AXIN2, BARD1, BMPR1A, BRCA1, BRCA2, BRIP1, CDH1, CDKN2A (p14ARF), CDKN2A (p16INK4a), CHEK2, CTNNA1, DICER1, EPCAM (Deletion/duplication testing only), GREM1 (promoter region deletion/duplication testing only), KIT, MEN1, MLH1, MSH2, MSH3, MSH6, MUTYH, NBN, NF1, NHTL1, PALB2, PDGFRA, PMS2, POLD1, POLE, PTEN, RAD50,  RAD51C, RAD51D, SDHB, SDHC, SDHD, SMAD4, SMARCA4. STK11, TP53, TSC1, TSC2, and VHL.  The following genes were evaluated for sequence changes only: SDHA and HOXB13 c.251G>A variant only.  Results: No pathogenic mutations identified.  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  The date of this test report is 10/21/2016.       11/10/2016 Pathology Results    Diagnosis- surgical pathology Colon, segmental resection for tumor, rectosigmoid ADENOCARCINOMA OF THE COLON, GRADE 3 (4.5 CM) THE TUMOR INVADES THROUGH THE MUSCULARIS PROPRIA INTO PERICOLONIC SOFT TISSUE (PT3) LYMPHOVASCULAR INVASION IS IDENTIFIED MARGINS OF RESECTION ARE NEGATIVE FOR CARCINOMA METASTATIC ADENOCARCINOMA IN FOUR OF TWENTY-TWO LYMPH NODES (4/22, PN2A)      11/10/2016 Surgery     XI ROBOT ASSISTED SIGMOIDECTOMY  SURGEON:  Surgeon(s): Kijuan Boston, MD Leighton Ruff, MD       04/04/5595 Miscellaneous    Colon cancer MSI-stable        Chemotherapy    CAPOX every 3 weeks for 6 months starting 12/06/16        HISTORY OF PRESENTING ILLNESS:  Juanda Bond Haydu 36 y.o. male is here because of colorectal cancer. He presents today with his wife, [name]. He was referred by his surgeon Dr. Marcello Moores for his stage III colon cancer.   Pt reports that 12-14 years ago he was diagnosed with stomach ulcers. He also had a confirmation scope later which confirmed this.  Initially, he presented to his gastroenterologist (Dr Nehemiah Settle) complaining of a 1-2 month history of rectal bleeding which  he describes as maroon colored blood. He does note that over the Spring or early Summer that he experienced some bright-red blood as well. He also had developed some cramping abdominal pain throughout this time as well with his maroon colored stools. He was on an antibiotic at that time for a non-cancerous mole removal which he thought was the source of his pain. His pain resolved after these  antibiotics, but he reports that it later returned several weeks ago. He had a DRE performed which did confirm the presence of blood and a colonoscopy was subsequently also performed at that time which revealed mass of the sigmoid or descending colon. He had biopsy performed of this which showed invasive adenocarcinoma. His CEA level around this time was 1.7. On 10/04/16 he had a CT A/P performed which showed no signs of metastatic disease, but it did show several mesenteric lymph nodes which were enlarged. The next day on 10/05/16 he had follow-up with Dr Marcello Moores of [facility] who recommended a left hemicolectomy in a minimally invasive fashion. He was also referred to genetics and scheduled to have a CT Chest performed to complete his metastatic workup. On 11/10/16 he underwent left hemicolectomy which was performed without complication. He recovered from this well during his admission and was subsequently discharge on 11/12/16. His genetic screening was performed and was normal. He does not have a FHx of colon cancer. 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 He has one son and two daughters who are all young. His wife currently is out of work to take care of their children and the home, she previously worked as a Marine scientist for [employer].   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   Mr Flinn presents today for consult. Overall, he is doing well. He has been recoving from his surgery well. He notes that if he twists his abdomen too quickly  he will have some pain surrounding his incisional site, but otherwise this has not really bothered him. He also notes that he has lost approximately 10-12lbs due to some appetite issues. His appetite has been returning slowly, however.  His energy levels have  been good overall. He denies nausea, vomiting, changes in bowel habits, or any other associated symptoms.    CURRENT THERAPY: CAPOX every 3 weeks for 6 months starting 12/06/16   INTERVAL HISTORY:  ELIBERTO SOLE is here for a follow up and first cycle CAPOX. He presents to the clinic today accompanied by his wife. He is overall feeling very well, he has gained a few pounds since last visit. He denies any pain, abdominal discomfort or other symptoms. He has very good appetite and energy level, he has resumed normal activities.   MEDICAL HISTORY:  Past Medical History:  Diagnosis Date  . Colon cancer ([place])   . Family history of lung cancer   . Right inguinal hernia   . Seizures ([place])    3 seizures 2016    SURGICAL HISTORY: Past Surgical History:  Procedure Laterality Date  . APPENDECTOMY  2006  . COLON SURGERY     for colon cancer  . HERNIA REPAIR    . INGUINAL HERNIA REPAIR  10/15/2011   Procedure: HERNIA REPAIR INGUINAL ADULT;  Surgeon: Odis Hollingshead, MD;  Location:  [facility];  Service: General;  Laterality: Right;  with mesh   . left knee arthroscopy     1995  . PORTACATH PLACEMENT Right 12/01/2016   Procedure: INSERTION PORT-A-CATH;  Surgeon: Leighton Ruff, MD;  Location: [facility];  Service: General;  Laterality: Right;  . PROCTOSCOPY  11/10/2016   Procedure: PROCTOSCOPY;  Surgeon: Leighton Ruff, MD;  Location: [facility];  Service: General;;  . VASECTOMY  10/15/2011   Procedure: VASECTOMY;  Surgeon: Claybon Jabs, MD;  Location: [facility];  Service: Urology;  Laterality: N/A;    SOCIAL HISTORY: Social History   Social History  . Marital status: Married    Spouse name: N/A  . Number of children: N/A  . Years of education: N/A   Occupational History  . Not on file.   Social History Main Topics  . Smoking status: Never Smoker  . Smokeless tobacco: Never Used  . Alcohol use Yes     Comment: rarely  . Drug use: No  . Sexual activity: Yes   Other Topics Concern  . Not  on file   Social History Narrative  . No narrative on file    FAMILY HISTORY: Family History  Problem Relation Age of Onset  . Lung cancer Father 4  . Lung cancer Paternal Grandfather        dx in 20's, died at 80  . Heart attack Maternal Grandmother 58    ALLERGIES:  has No Known Allergies.  MEDICATIONS:  Current Outpatient Prescriptions  Medication Sig Dispense Refill  . capecitabine (XELODA) 500 MG tablet Take 4 tablets (2,000 mg total) by mouth 2 (two) times daily after a meal. Take on days 1-14 of chemotherapy. 112 tablet 3  . FLUZONE QUADRIVALENT 0.5 ML injection TO BE ADMINISTERED BY PHARMACIST FOR IMMUNIZATION  0  . LamoTRIgine XR 200 MG TB24 Take 400 mg by mouth at bedtime.  5  . Melatonin 3 MG TABS Take 3 mg by mouth at bedtime as needed (for sleep.).    Marland Kitchen Omega-3 Fatty Acids (FISH OIL PO) Take 2 capsules by mouth daily.    . ondansetron (ZOFRAN) 8 MG tablet Take 1 tablet (8 mg total) by mouth 2 (two) times daily as needed for refractory nausea / vomiting. Start on day 3 after chemotherapy. 30 tablet 1  . prochlorperazine (COMPAZINE) 10 MG tablet Take 1 tablet (10 mg total) by mouth every 6 (six) hours as needed (Nausea or vomiting). 30 tablet 1  . dexamethasone (DECADRON) 4 MG tablet Take 2 tablets (8 mg total) by mouth daily. Start the day after chemotherapy for 2 days. Take with food. 30 tablet 1  . lidocaine-prilocaine (EMLA) cream Apply 1 application topically as needed. 30 g 2  . lidocaine-prilocaine (EMLA) cream Apply to affected area once 30 g 3   No current facility-administered medications for this visit.     REVIEW OF SYSTEMS:   Constitutional: Denies fevers, chills or abnormal night sweats. (+)weight loss Eyes: Denies blurriness of vision, double vision or watery eyes Ears, nose, mouth, throat, and face: Denies mucositis or sore throat Respiratory: Denies cough, dyspnea or wheezes Cardiovascular: Denies palpitation, chest discomfort or lower extremity  swelling Gastrointestinal:  Denies nausea, heartburn or change in bowel habits Skin: Denies abnormal skin rashes Lymphatics: Denies new lymphadenopathy or easy bruising Neurological:Denies numbness, tingling or new weaknesses Behavioral/Psych: Mood is stable, no new changes  All other systems were reviewed with the patient and are negative.  PHYSICAL EXAMINATION:  ECOG PERFORMANCE STATUS: 0  Vitals:   12/06/16 1220  BP: (!) 146/72  Pulse: 67  Resp: 18  Temp: 98.6 F (37 C)  SpO2: 100%   Filed Weights   12/06/16 1220  Weight: 176 lb 6.4 oz (80 kg)    GENERAL:alert, no distress and comfortable SKIN: skin color, texture, turgor are normal, no rashes or significant lesions EYES: normal, conjunctiva are pink and non-injected, sclera clear OROPHARYNX:no exudate, no erythema and lips, buccal mucosa, and tongue normal  NECK: supple, thyroid normal size, non-tender, without nodularity LYMPH:  no palpable lymphadenopathy in the cervical, axillary or inguinal LUNGS: clear to auscultation and percussion with normal breathing effort HEART: regular rate & rhythm and no murmurs and no lower extremity edema ABDOMEN:abdomen soft, non-tender and normal bowel sounds. Multiple small surgical incisions have healed well. No discharge or surrounding skin erythema. No organmegaly.  Musculoskeletal:no cyanosis of digits and no clubbing  PSYCH: alert & oriented x 3 with fluent speech NEURO: no focal motor/sensory deficits  LABORATORY DATA:  I have reviewed the data as listed CBC Latest Ref Rng & Units 12/06/2016 11/12/2016 11/11/2016  WBC 4.0 - 10.3 10e3/uL 7.3 10.4 12.9(H)  Hemoglobin 13.0 - 17.1 g/dL 14.0 12.8(L) 13.3  Hematocrit 38.4 - 49.9 % 41.4 37.5(L) 38.6(L)  Platelets 140 - 400 10e3/uL 206 195 216   CMP Latest Ref Rng & Units 12/06/2016 11/12/2016 11/11/2016  Glucose 70 - 140 mg/dl 140 96 128(H)  BUN 7.0 - 26.0 mg/dL 17.0 10 9  Creatinine 0.7 - 1.3 mg/dL 1.2 1.02 1.01  Sodium 136 - 145  mEq/L 140 141 142  Potassium 3.5 - 5.1 mEq/L 3.8 3.9 4.2  Chloride 101 - 111 mmol/L - 108 108  CO2 22 - 29 mEq/L 26 26 27   Calcium 8.4 - 10.4 mg/dL 9.5 8.8(L) 9.0  Total Protein 6.4 - 8.3 g/dL 7.2 - -  Total Bilirubin 0.20 - 1.20 mg/dL 0.40 - -  Alkaline Phos 40 - 150 U/L 97 - -  AST 5 - 34 U/L 20 - -  ALT 0 - 55 U/L 18 - -    PATHOLOGY RESULTS:   Diagnosis 11/10/16 Colon, segmental resection for tumor, rectosigmoid ADENOCARCINOMA OF THE COLON, GRADE 3 (4.5 CM) THE TUMOR INVADES THROUGH THE MUSCULARIS PROPRIA INTO PERICOLONIC SOFT TISSUE (PT3) LYMPHOVASCULAR INVASION IS IDENTIFIED MARGINS OF RESECTION ARE NEGATIVE FOR CARCINOMA METASTATIC ADENOCARCINOMA IN FOUR OF TWENTY-TWO LYMPH NODES (4/22, PN2A)  Microscopic Comment COLON AND RECTUM (INCLUDING TRANS-ANAL RESECTION): Specimen: Rectosigmoid colon Procedure: Segmental resection Tumor site: descending colon Specimen integrity: Intact Macroscopic intactness of mesorectum: Not applicable: NA Complete: x Near complete: NA Incomplete: NA Cannot be determined (specify): NA Macroscopic tumor perforation: through the muscularis propria into the subserosal adipose tissue Invasive tumor: Maximum size: 4.5 cm Histologic type(s): Adenocarcinoma Histologic grade and differentiation: G1: well differentiated/low grade G2: moderately differentiated/low grade G3: poorly differentiated/high grade G4: undifferentiated/high grade Type of polyp in which invasive carcinoma arose: Tubular adenoma Microscopic extension of invasive tumor: The tumor invades through muscularis propria into the subserosal adiposetissue Lymph-Vascular invasion: Identified Peri-neural invasion: Negative Tumor deposit(s) (discontinuous extramural extension): negative Resection margins: Proximal margin: negative Distal margin: Negative Circumferential (radial) (posterior ascending, posterior descending; lateral and posterior mid-rectum; and entire lower 1/3  rectum):negative Mesenteric margin (sigmoid and transverse): Negative Distance closest margin (if all above margins negative): 0.1 cm to radial margin Trans-anal resection margins only: Deep margin: NA Mucosal Margin: NA Distance closest mucosal margin (if negative): NA Treatment effect (neo-adjuvant therapy): Negative Additional polyp(s): Negative Non-neoplastic findings:  Unremarkable Lymph nodes: number examined 22; number positive: 4 Pathologic Staging: pT3, pN2a, pMx Ancillary studies: ordered  RADIOGRAPHIC STUDIES: I have personally reviewed the radiological images as listed and agreed with the findings in the report. Dg Chest 1 View  Result Date: 12/01/2016 CLINICAL DATA:  Porta catheter insertion EXAM: CHEST 1 VIEW COMPARISON:  Chest CT 10/13/2016 FINDINGS: There is a right IJ porta catheter with tip at the SVC level. Loose loop in the neck with no kink. Negative for pneumothorax. The lungs are clear. Normal heart size and mediastinal contours. IMPRESSION: Porta catheter with tip at the SVC. No pneumothorax or other acute finding. Electronically Signed   By: Monte Fantasia M.D.   On: 12/01/2016 16:09   Dg C-arm 1-60 Min-no Report  Result Date: 12/01/2016 Fluoroscopy was utilized by the requesting physician.  No radiographic interpretation.    ASSESSMENT: 36 year old Caucasian male with PMH of seizure, presented with recurrent rectal bleeding   1. Left colon cancer of the sigmoid colon, invasive adenocarcinoma, GX,  pT3N2M0, stage IIIB, MSI stable -I previously reviewed his CT scan findings, and surgical pathology results in great details with patient and his wife. He had several questions which I answered in great detail.  -He had a complete surgical resection with negative margins, the tumor was T3 lesion and 4/22 nodes were positive.  -Staging scan was negative for distant metastasis. -Due to the high-risk of recurrence, I recommend adjuvant chemotherapy CAPOX for 6 months.  We discussed the option of switched to FOLFOX if he has significant side effects from CAPOX.  -He has been recovering very well from his 11/10/16 surgery. He had his port placed on 12/01/16 and plan to start CAPOX on 12/06/16 -Lab reviewed, adequate for treatment, we'll start first cycle chemotherapy today. He has taken first dose Xeloda this morning. -We again reviewed potential side effects from chemotherapy, especially cold sensitivities, neuropathy, cytopenias, risk of infections, etc. He knows to call us if he has any concerns after chemotherapy. Patient again had multiple questions, I answered to his satisfaction. -I'll see him every 3 weeks where he is on chemotherapy   2. Weight loss and decreased appetite -I encouraged him to eat more if he is able. We will refer him to our dietician service to assist him with this. We will consider supplements in the future to help with this following chemotherapy.  -He has gained some weight back lately.  3. History of seizure  -Continue medication and a follow-up with his neurologist at [facility].  PLAN:  -Lab reviewed, adequate to treat, he will start first cycle CAPOX today  -he was interested in the financial toxicity clinical trial, he has met our research nurse previously, but he took first dose Xeloda before he was ready to sign consent, he became ineligible for the trial -lab, f/u and second cycle chemo in 3 weeks -He knows to call us if he has any significant side effects after chemotherapy  No orders of the defined types were placed in this encounter.    Truitt Merle, MD 12/06/2016

## 2016-12-06 ENCOUNTER — Ambulatory Visit: Payer: PRIVATE HEALTH INSURANCE | Admitting: Hematology

## 2016-12-06 ENCOUNTER — Other Ambulatory Visit (HOSPITAL_BASED_OUTPATIENT_CLINIC_OR_DEPARTMENT_OTHER): Payer: Self-pay

## 2016-12-06 ENCOUNTER — Ambulatory Visit: Payer: Self-pay

## 2016-12-06 ENCOUNTER — Encounter: Payer: Self-pay | Admitting: Hematology

## 2016-12-06 ENCOUNTER — Encounter: Payer: Self-pay | Admitting: Medical Oncology

## 2016-12-06 ENCOUNTER — Other Ambulatory Visit: Payer: PRIVATE HEALTH INSURANCE

## 2016-12-06 ENCOUNTER — Ambulatory Visit (HOSPITAL_BASED_OUTPATIENT_CLINIC_OR_DEPARTMENT_OTHER): Payer: Self-pay | Admitting: Hematology

## 2016-12-06 ENCOUNTER — Telehealth: Payer: Self-pay | Admitting: Hematology

## 2016-12-06 ENCOUNTER — Ambulatory Visit (HOSPITAL_BASED_OUTPATIENT_CLINIC_OR_DEPARTMENT_OTHER): Payer: Self-pay

## 2016-12-06 VITALS — BP 146/72 | HR 67 | Temp 98.6°F | Resp 18 | Ht 72.0 in | Wt 176.4 lb

## 2016-12-06 DIAGNOSIS — R634 Abnormal weight loss: Secondary | ICD-10-CM

## 2016-12-06 DIAGNOSIS — C187 Malignant neoplasm of sigmoid colon: Secondary | ICD-10-CM

## 2016-12-06 DIAGNOSIS — Z5111 Encounter for antineoplastic chemotherapy: Secondary | ICD-10-CM

## 2016-12-06 DIAGNOSIS — C19 Malignant neoplasm of rectosigmoid junction: Secondary | ICD-10-CM

## 2016-12-06 DIAGNOSIS — R630 Anorexia: Secondary | ICD-10-CM

## 2016-12-06 DIAGNOSIS — C189 Malignant neoplasm of colon, unspecified: Secondary | ICD-10-CM

## 2016-12-06 LAB — CBC WITH DIFFERENTIAL/PLATELET
BASO%: 1 % (ref 0.0–2.0)
Basophils Absolute: 0.1 10*3/uL (ref 0.0–0.1)
EOS%: 3.4 % (ref 0.0–7.0)
Eosinophils Absolute: 0.2 10*3/uL (ref 0.0–0.5)
HCT: 41.4 % (ref 38.4–49.9)
HGB: 14 g/dL (ref 13.0–17.1)
LYMPH#: 2.4 10*3/uL (ref 0.9–3.3)
LYMPH%: 32.7 % (ref 14.0–49.0)
MCH: 30.2 pg (ref 27.2–33.4)
MCHC: 33.8 g/dL (ref 32.0–36.0)
MCV: 89.4 fL (ref 79.3–98.0)
MONO#: 0.4 10*3/uL (ref 0.1–0.9)
MONO%: 4.9 % (ref 0.0–14.0)
NEUT%: 58 % (ref 39.0–75.0)
NEUTROS ABS: 4.2 10*3/uL (ref 1.5–6.5)
PLATELETS: 206 10*3/uL (ref 140–400)
RBC: 4.63 10*6/uL (ref 4.20–5.82)
RDW: 12.6 % (ref 11.0–14.6)
WBC: 7.3 10*3/uL (ref 4.0–10.3)

## 2016-12-06 LAB — COMPREHENSIVE METABOLIC PANEL
ALBUMIN: 4.1 g/dL (ref 3.5–5.0)
ALK PHOS: 97 U/L (ref 40–150)
ALT: 18 U/L (ref 0–55)
AST: 20 U/L (ref 5–34)
Anion Gap: 8 mEq/L (ref 3–11)
BUN: 17 mg/dL (ref 7.0–26.0)
CALCIUM: 9.5 mg/dL (ref 8.4–10.4)
CO2: 26 mEq/L (ref 22–29)
Chloride: 106 mEq/L (ref 98–109)
Creatinine: 1.2 mg/dL (ref 0.7–1.3)
EGFR: 75 mL/min/{1.73_m2} — AB (ref >90)
Glucose: 140 mg/dl (ref 70–140)
POTASSIUM: 3.8 meq/L (ref 3.5–5.1)
Sodium: 140 mEq/L (ref 136–145)
Total Bilirubin: 0.4 mg/dL (ref 0.20–1.20)
Total Protein: 7.2 g/dL (ref 6.4–8.3)

## 2016-12-06 MED ORDER — DEXTROSE 5 % IV SOLN
Freq: Once | INTRAVENOUS | Status: AC
  Administered 2016-12-06: 14:00:00 via INTRAVENOUS

## 2016-12-06 MED ORDER — DEXAMETHASONE SODIUM PHOSPHATE 10 MG/ML IJ SOLN
10.0000 mg | Freq: Once | INTRAMUSCULAR | Status: AC
  Administered 2016-12-06: 10 mg via INTRAVENOUS

## 2016-12-06 MED ORDER — PALONOSETRON HCL INJECTION 0.25 MG/5ML
0.2500 mg | Freq: Once | INTRAVENOUS | Status: AC
  Administered 2016-12-06: 0.25 mg via INTRAVENOUS

## 2016-12-06 MED ORDER — HEPARIN SOD (PORK) LOCK FLUSH 100 UNIT/ML IV SOLN
500.0000 [IU] | Freq: Once | INTRAVENOUS | Status: AC | PRN
  Administered 2016-12-06: 500 [IU]
  Filled 2016-12-06: qty 5

## 2016-12-06 MED ORDER — LIDOCAINE-PRILOCAINE 2.5-2.5 % EX CREA: TOPICAL_CREAM | CUTANEOUS | 3 refills | Status: DC

## 2016-12-06 MED ORDER — PALONOSETRON HCL INJECTION 0.25 MG/5ML
INTRAVENOUS | Status: AC
  Filled 2016-12-06: qty 5

## 2016-12-06 MED ORDER — LIDOCAINE-PRILOCAINE 2.5-2.5 % EX CREA: 1.0000 "application " | TOPICAL_CREAM | CUTANEOUS | 2 refills | Status: DC | PRN

## 2016-12-06 MED ORDER — SODIUM CHLORIDE 0.9% FLUSH
10.0000 mL | INTRAVENOUS | Status: DC | PRN
  Administered 2016-12-06: 10 mL
  Filled 2016-12-06: qty 10

## 2016-12-06 MED ORDER — OXALIPLATIN CHEMO INJECTION 100 MG/20ML
126.0000 mg/m2 | Freq: Once | INTRAVENOUS | Status: AC
  Administered 2016-12-06: 250 mg via INTRAVENOUS
  Filled 2016-12-06: qty 40

## 2016-12-06 MED ORDER — DEXAMETHASONE 4 MG PO TABS: 8.0000 mg | ORAL_TABLET | Freq: Every day | ORAL | 1 refills | Status: DC

## 2016-12-06 MED ORDER — DEXAMETHASONE SODIUM PHOSPHATE 10 MG/ML IJ SOLN
INTRAMUSCULAR | Status: AC
  Filled 2016-12-06: qty 1

## 2016-12-06 NOTE — Patient Instructions (Signed)
Implanted Port Home Guide An implanted port is a type of central line that is placed under the skin. Central lines are used to provide IV access when treatment or nutrition needs to be given through a person's veins. Implanted ports are used for long-term IV access. An implanted port may be placed because:  You need IV medicine that would be irritating to the small veins in your hands or arms.  You need long-term IV medicines, such as antibiotics.  You need IV nutrition for a long period.  You need frequent blood draws for lab tests.  You need dialysis.  Implanted ports are usually placed in the chest area, but they can also be placed in the upper arm, the abdomen, or the leg. An implanted port has two main parts:  Reservoir. The reservoir is round and will appear as a small, raised area under your skin. The reservoir is the part where a needle is inserted to give medicines or draw blood.  Catheter. The catheter is a thin, flexible tube that extends from the reservoir. The catheter is placed into a large vein. Medicine that is inserted into the reservoir goes into the catheter and then into the vein.  How will I care for my incision site? Do not get the incision site wet. Bathe or shower as directed by your health care provider. How is my port accessed? Special steps must be taken to access the port:  Before the port is accessed, a numbing cream can be placed on the skin. This helps numb the skin over the port site.  Your health care provider uses a sterile technique to access the port. ? Your health care provider must put on a mask and sterile gloves. ? The skin over your port is cleaned carefully with an antiseptic and allowed to dry. ? The port is gently pinched between sterile gloves, and a needle is inserted into the port.  Only "non-coring" port needles should be used to access the port. Once the port is accessed, a blood return should be checked. This helps ensure that the port  is in the vein and is not clogged.  If your port needs to remain accessed for a constant infusion, a clear (transparent) bandage will be placed over the needle site. The bandage and needle will need to be changed every week, or as directed by your health care provider.  Keep the bandage covering the needle clean and dry. Do not get it wet. Follow your health care provider's instructions on how to take a shower or bath while the port is accessed.  If your port does not need to stay accessed, no bandage is needed over the port.  What is flushing? Flushing helps keep the port from getting clogged. Follow your health care provider's instructions on how and when to flush the port. Ports are usually flushed with saline solution or a medicine called heparin. The need for flushing will depend on how the port is used.  If the port is used for intermittent medicines or blood draws, the port will need to be flushed: ? After medicines have been given. ? After blood has been drawn. ? As part of routine maintenance.  If a constant infusion is running, the port may not need to be flushed.  How long will my port stay implanted? The port can stay in for as long as your health care provider thinks it is needed. When it is time for the port to come out, surgery will be   done to remove it. The procedure is similar to the one performed when the port was put in. When should I seek immediate medical care? When you have an implanted port, you should seek immediate medical care if:  You notice a bad smell coming from the incision site.  You have swelling, redness, or drainage at the incision site.  You have more swelling or pain at the port site or the surrounding area.  You have a fever that is not controlled with medicine.  This information is not intended to replace advice given to you by your health care provider. Make sure you discuss any questions you have with your health care provider. Document  Released: 02/15/2005 Document Revised: 07/24/2015 Document Reviewed: 10/23/2012 Elsevier Interactive Patient Education  2017 [employer]  

## 2016-12-06 NOTE — Telephone Encounter (Signed)
Gave avs and calendar for November  °

## 2016-12-06 NOTE — Patient Instructions (Addendum)
[facility] Discharge Instructions for Patients Receiving Chemotherapy  Today you received the following chemotherapy agents Oxaliplatin  To help prevent nausea and vomiting after your treatment, we encourage you to take your nausea medication as prescribed by MD.  **DO NOT TAKE ZOFRAN FOR 3 DAYS AFTER CHEMOTHERAPY; MAY RESUME ON Thursday**   If you develop nausea and vomiting that is not controlled by your nausea medication, call the clinic.   BELOW ARE SYMPTOMS THAT SHOULD BE REPORTED IMMEDIATELY:  *FEVER GREATER THAN 100.5 F  *CHILLS WITH OR WITHOUT FEVER  NAUSEA AND VOMITING THAT IS NOT CONTROLLED WITH YOUR NAUSEA MEDICATION  *UNUSUAL SHORTNESS OF BREATH  *UNUSUAL BRUISING OR BLEEDING  TENDERNESS IN MOUTH AND THROAT WITH OR WITHOUT PRESENCE OF ULCERS  *URINARY PROBLEMS  *BOWEL PROBLEMS  UNUSUAL RASH Items with * indicate a potential emergency and should be followed up as soon as possible.  Feel free to call the clinic should you have any questions or concerns. The clinic phone number is [phone].  Please show the [facility] at check-in to the Emergency Department and triage nurse.  Oxaliplatin Injection What is this medicine? OXALIPLATIN (ox AL i PLA tin) is a chemotherapy drug. It targets fast dividing cells, like cancer cells, and causes these cells to die. This medicine is used to treat cancers of the colon and rectum, and many other cancers. This medicine may be used for other purposes; ask your health care provider or pharmacist if you have questions. COMMON BRAND NAME(S): Eloxatin What should I tell my health care provider before I take this medicine? They need to know if you have any of these conditions: -kidney disease -an unusual or allergic reaction to oxaliplatin, other chemotherapy, other medicines, foods, dyes, or preservatives -pregnant or trying to get pregnant -breast-feeding How should I use this medicine? This drug is given  as an infusion into a vein. It is administered in a hospital or clinic by a specially trained health care professional. Talk to your pediatrician regarding the use of this medicine in children. Special care may be needed. Overdosage: If you think you have taken too much of this medicine contact a poison control center or emergency room at once. NOTE: This medicine is only for you. Do not share this medicine with others. What if I miss a dose? It is important not to miss a dose. Call your doctor or health care professional if you are unable to keep an appointment. What may interact with this medicine? -medicines to increase blood counts like filgrastim, pegfilgrastim, sargramostim -probenecid -some antibiotics like amikacin, gentamicin, neomycin, polymyxin B, streptomycin, tobramycin -zalcitabine Talk to your doctor or health care professional before taking any of these medicines: -acetaminophen -aspirin -ibuprofen -ketoprofen -naproxen This list may not describe all possible interactions. Give your health care provider a list of all the medicines, herbs, non-prescription drugs, or dietary supplements you use. Also tell them if you smoke, drink alcohol, or use illegal drugs. Some items may interact with your medicine. What should I watch for while using this medicine? Your condition will be monitored carefully while you are receiving this medicine. You will need important blood work done while you are taking this medicine. This medicine can make you more sensitive to cold. Do not drink cold drinks or use ice. Cover exposed skin before coming in contact with cold temperatures or cold objects. When out in cold weather wear warm clothing and cover your mouth and nose to warm the air that goes  into your lungs. Tell your doctor if you get sensitive to the cold. This drug may make you feel generally unwell. This is not uncommon, as chemotherapy can affect healthy cells as well as cancer cells. Report  any side effects. Continue your course of treatment even though you feel ill unless your doctor tells you to stop. In some cases, you may be given additional medicines to help with side effects. Follow all directions for their use. Call your doctor or health care professional for advice if you get a fever, chills or sore throat, or other symptoms of a cold or flu. Do not treat yourself. This drug decreases your body's ability to fight infections. Try to avoid being around people who are sick. This medicine may increase your risk to bruise or bleed. Call your doctor or health care professional if you notice any unusual bleeding. Be careful brushing and flossing your teeth or using a toothpick because you may get an infection or bleed more easily. If you have any dental work done, tell your dentist you are receiving this medicine. Avoid taking products that contain aspirin, acetaminophen, ibuprofen, naproxen, or ketoprofen unless instructed by your doctor. These medicines may hide a fever. Do not become pregnant while taking this medicine. Women should inform their doctor if they wish to become pregnant or think they might be pregnant. There is a potential for serious side effects to an unborn child. Talk to your health care professional or pharmacist for more information. Do not breast-feed an infant while taking this medicine. Call your doctor or health care professional if you get diarrhea. Do not treat yourself. What side effects may I notice from receiving this medicine? Side effects that you should report to your doctor or health care professional as soon as possible: -allergic reactions like skin rash, itching or hives, swelling of the face, lips, or tongue -low blood counts - This drug may decrease the number of white blood cells, red blood cells and platelets. You may be at increased risk for infections and bleeding. -signs of infection - fever or chills, cough, sore throat, pain or difficulty  passing urine -signs of decreased platelets or bleeding - bruising, pinpoint red spots on the skin, black, tarry stools, nosebleeds -signs of decreased red blood cells - unusually weak or tired, fainting spells, lightheadedness -breathing problems -chest pain, pressure -cough -diarrhea -jaw tightness -mouth sores -nausea and vomiting -pain, swelling, redness or irritation at the injection site -pain, tingling, numbness in the hands or feet -problems with balance, talking, walking -redness, blistering, peeling or loosening of the skin, including inside the mouth -trouble passing urine or change in the amount of urine Side effects that usually do not require medical attention (report to your doctor or health care professional if they continue or are bothersome): -changes in vision -constipation -hair loss -loss of appetite -metallic taste in the mouth or changes in taste -stomach pain This list may not describe all possible side effects. Call your doctor for medical advice about side effects. You may report side effects to FDA at 1-800-FDA-1088. Where should I keep my medicine? This drug is given in a hospital or clinic and will not be stored at home. NOTE: This sheet is a summary. It may not cover all possible information. If you have questions about this medicine, talk to your doctor, pharmacist, or health care provider.  2018 [employer]/Gold Standard (2007-09-12 17:22:47)

## 2016-12-06 NOTE — Progress Notes (Signed)
  Oncology Nurse Navigator Documentation  Navigator Location: [facility] (12/06/16 1500)   )Navigator Encounter Type: Treatment (12/06/16 1500)   Abnormal Finding Date: 12/01/16 (12/06/16 1500)   Surgery Date: 11/10/16 (12/06/16 1500)           Treatment Initiated Date: 11/10/16 (12/06/16 1500) Patient Visit Type: MedOnc (12/06/16 1500) Treatment Phase: First Chemo Tx (12/06/16 1500) Barriers/Navigation Needs: Education (12/06/16 1500) Education: Coping with Diagnosis/ Prognosis (12/06/16 1500) Interventions: Psycho-social support;Education (12/06/16 1500)  Met with patient and wife twice during patient's time in infusion area. Patient encouraged and supported during his first Oxaliplatin infusion. Signs and symptoms of infusion reaction reviewed. Education on ways to avoid cold sensitivity reaction once home reviewed. Patient encouraged to call with any questions or concerned. Patient voiced hope that the next six months would pass quickly.          Acuity: Level 2 (12/06/16 1500)   Acuity Level 2: Educational needs;Ongoing guidance and education throughout treatment as needed (12/06/16 1500)     Time Spent with Patient: 30 (12/06/16 1500)

## 2016-12-11 ENCOUNTER — Emergency Department (HOSPITAL_COMMUNITY)
Admission: EM | Admit: 2016-12-11 | Discharge: 2016-12-11 | Discharge status: Absent without leave | Payer: PRIVATE HEALTH INSURANCE

## 2016-12-13 ENCOUNTER — Other Ambulatory Visit: Payer: Self-pay | Admitting: Medical

## 2016-12-13 ENCOUNTER — Telehealth: Payer: Self-pay

## 2016-12-13 ENCOUNTER — Telehealth: Payer: Self-pay | Admitting: Medical

## 2016-12-13 DIAGNOSIS — A0472 Enterocolitis due to Clostridium difficile, not specified as recurrent: Secondary | ICD-10-CM

## 2016-12-13 MED ORDER — METRONIDAZOLE 500 MG PO TABS: 500.0000 mg | ORAL_TABLET | Freq: Three times a day (TID) | ORAL | 0 refills | Status: AC

## 2016-12-13 MED ORDER — CIPROFLOXACIN HCL 500 MG PO TABS: 500.0000 mg | ORAL_TABLET | Freq: Two times a day (BID) | ORAL | 0 refills | Status: AC

## 2016-12-13 NOTE — Telephone Encounter (Signed)
Pt dx with C. Diff in ER (10/13). Recent tx with Xeloda. No meds as yet from ER. I sent RX for Flagyl and Cipro. Told to push fluids. Hold Lomotil unless dehydrated. Reviewed signs of dehydration. Told wife that we would like the patient to hydrate at home rather than come into [facility] for fluids. Sandi Mealy

## 2016-12-13 NOTE — Progress Notes (Signed)
  Oncology Nurse Navigator Documentation  Navigator Location: [facility] (12/13/16 0920)   )  Telephone: Incoming Call;Symptom Mgt (12/13/16 0920)                         Education: Pain/ Symptom Management (12/13/16 0920) Interventions: Other (referred to Sandi Mealy PA/Symptom management) (12/13/16 0920)  Andres Wallace called to ask for guidance on medicine /symptom management r/t dehydration and diarrhea. Patient had been dealing with diarrhea throughout the weekend and had been to the [facility] ED for IV fluids. Patient's stool tested positive for C-diff. I educated patient on [facility] Symptom management clinic and told her that I would speak with Sandi Mealy PA and that someone from [facility] would call her shortly. I followed up with Can Tanner PA who called patient and offered guidance.          Acuity: Level 2 (12/13/16 0920)   Acuity Level 2: Ongoing guidance and education throughout treatment as needed (referral to symptom management) (12/13/16 0920)     Time Spent with Patient: 30 (12/13/16 0920)

## 2016-12-13 NOTE — Telephone Encounter (Signed)
Incoming telehealth messages from the weekend. LVM on pt's and wife's phones. Dawn Placke came by triage at same time and stated wife had talked with her. Sandi Mealy PA is looking at chart as well.

## 2016-12-14 DIAGNOSIS — C189 Malignant neoplasm of colon, unspecified: Secondary | ICD-10-CM

## 2016-12-14 NOTE — Progress Notes (Signed)
I called patient to see how he is doing with the antibiotic and diarrhea. Patient's wife stated that he is doing "a little better today" and his urine is straw colored. Mrs. Kimoto had concerns about patient's dietary intake and lack of appetite. I placed a referral for a nutrition consult and reached out to [place]. Patient encouraged to call with any questions or concerns.

## 2016-12-20 MED FILL — CAPECITABINE 500 MG TABLET: 500 | 21 days supply | Qty: 112 | Fill #1

## 2016-12-24 NOTE — Progress Notes (Addendum)
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 Melina Copa: There is an obviously malignant mass at 28 cm in the sigmoid colon. He was circumferential and the colonoscopy could not be passed through the lesions. Multiple biopsies taken.      10/01/2016 Initial Biopsy    Sigmoid colon mass biopsy showed invasive adenocarcinoma.      10/04/2016 Imaging    CT abdomen/pelvis - no signs of metastatic disease, several enlarged mesenteric lymph nodes      10/13/2016 Imaging    CT chest, abdomen and the pelvis with contrast and showed no evidence of distant recurrence.       10/21/2016 Genetic Testing    The patient had genetic testing due to a personal history of colon cancer at 42.  He was tested for the Common Hereditary Cancer Panel.  The Hereditary Gene Panel offered by [facility] includes sequencing and/or deletion duplication testing of the following 46 genes: APC, ATM, AXIN2, BARD1, BMPR1A, BRCA1, BRCA2, BRIP1, CDH1, CDKN2A (p14ARF), CDKN2A (p16INK4a), CHEK2, CTNNA1, DICER1, EPCAM (Deletion/duplication testing only), GREM1 (promoter region deletion/duplication testing only), KIT, MEN1, MLH1, MSH2, MSH3, MSH6, MUTYH, NBN, NF1, NHTL1, PALB2, PDGFRA, PMS2, POLD1, POLE, PTEN, RAD50,  RAD51C, RAD51D, SDHB, SDHC, SDHD, SMAD4, SMARCA4. STK11, TP53, TSC1, TSC2, and VHL.  The following genes were evaluated for sequence changes only: SDHA and HOXB13 c.251G>A variant only.  Results: No pathogenic mutations identified.  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  The date of this test report is 10/21/2016.       11/10/2016 Pathology Results    Diagnosis- surgical pathology Colon, segmental resection for tumor, rectosigmoid ADENOCARCINOMA OF THE COLON, GRADE 3 (4.5 CM) THE TUMOR INVADES THROUGH THE MUSCULARIS PROPRIA INTO PERICOLONIC SOFT TISSUE (PT3) LYMPHOVASCULAR INVASION IS IDENTIFIED MARGINS OF RESECTION ARE NEGATIVE FOR CARCINOMA METASTATIC ADENOCARCINOMA IN FOUR OF TWENTY-TWO LYMPH NODES (4/22, PN2A)      11/10/2016 Surgery     XI ROBOT ASSISTED SIGMOIDECTOMY  SURGEON:  Surgeon(s): Antoin Boston, MD Leighton Ruff, MD       6/86/1683 Miscellaneous    Colon cancer MSI-stable       12/06/2016 -  Chemotherapy    CAPOX every 3 weeks for 6 months starting 12/06/16        HISTORY OF PRESENTING ILLNESS:  Andres Wallace 36 y.o. male is here because of colorectal cancer. He presents today with his wife, Andres Wallace. He was referred by his surgeon Dr. Marcello Moores for his stage III colon cancer.   Pt reports that 12-14 years ago he was diagnosed with stomach ulcers. He also had a confirmation scope later which confirmed this.  Initially, he presented to his gastroenterologist (Dr Nehemiah Settle) complaining of a 1-2 month history of rectal  bleeding which he describes as maroon colored blood. He does note that over the Spring or early Summer that he experienced some bright-red blood as well. He also had developed some cramping abdominal pain throughout this time as well with his maroon colored stools. He was on an antibiotic at that time for a non-cancerous mole removal which he thought was the source of his pain. His pain resolved after  these antibiotics, but he reports that it later returned several weeks ago. He had a DRE performed which did confirm the presence of blood and a colonoscopy was subsequently also performed at that time which revealed mass of the sigmoid or descending colon. He had biopsy performed of this which showed invasive adenocarcinoma. His CEA level around this time was 1.7. On 10/04/16 he had a CT A/P performed which showed no signs of metastatic disease, but it did show several mesenteric lymph nodes which were enlarged. The next day on 10/05/16 he had follow-up with Dr Marcello Moores of [facility] who recommended a left hemicolectomy in a minimally invasive fashion. He was also referred to genetics and scheduled to have a CT Chest performed to complete his metastatic workup. On 11/10/16 he underwent left hemicolectomy which was performed without complication. He recovered from this well during his admission and was subsequently discharge on 11/12/16. His genetic screening was performed and was normal. He does not have a FHx of colon cancer. 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 He has one son and two daughters who are all young. His wife currently is out of work to take care of their children and the home, she previously worked as a Marine scientist for [employer].   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   [name] presents today for consult. Overall, he is doing well. He has been recoving from his surgery well. He notes that if he twists his abdomen too quickly  he will have some pain surrounding his incisional site, but otherwise this has not really bothered him. He also notes that he has lost approximately 10-12lbs due to some appetite issues. His appetite has been returning slowly, however.  His energy levels  have been good overall. He denies nausea, vomiting, changes in bowel habits, or any other associated symptoms.    CURRENT THERAPY: CAPOX every 3 weeks for 6 months starting 12/06/16   INTERVAL HISTORY:   RAFE MACKOWSKI is here for a follow up and cycle 2 CAPOX. He presents to the clinic today accompanied by his wife.  He reports his recent episode of C.Diff infection. His cycle 1 chemo went well, and he developed severe diarrhea on day 5 after chemotherapy, no fever, associated with fatigue and low appetite.  He went to local ED and stool test showed positive for C. Difficile by PCR. He completed his 10 day Flagyl and Cipro treatment on 12/24/16. His diarrhea is now resolved. Once C. Diff was treated he felt much better. He has gained his weight back.   MEDICAL HISTORY:  Past Medical History:  Diagnosis Date  . Colon cancer ([place])   . Family history of lung cancer   . Right inguinal hernia   . Seizures ([place])    3 seizures 2016    SURGICAL HISTORY: Past Surgical History:  Procedure  Laterality Date  . APPENDECTOMY  2006  . COLON SURGERY     for colon cancer  . HERNIA REPAIR    . INGUINAL HERNIA REPAIR  10/15/2011   Procedure: HERNIA REPAIR INGUINAL ADULT;  Surgeon: Odis Hollingshead, MD;  Location: [facility];  Service: General;  Laterality: Right;  with mesh   . left knee arthroscopy     1995  . PORTACATH PLACEMENT Right 12/01/2016   Procedure: INSERTION PORT-A-CATH;  Surgeon: Leighton Ruff, MD;  Location: [facility];  Service: General;  Laterality: Right;  . PROCTOSCOPY  11/10/2016   Procedure: PROCTOSCOPY;  Surgeon: Leighton Ruff, MD;  Location: [facility];  Service: General;;  . VASECTOMY  10/15/2011   Procedure: VASECTOMY;  Surgeon: Claybon Jabs, MD;  Location: [facility];  Service: Urology;  Laterality: N/A;    SOCIAL HISTORY: Social History   Social History  . Marital status: Married    Spouse name: N/A  . Number of children: N/A  . Years of education: N/A   Occupational History    . Not on file.   Social History Main Topics  . Smoking status: Never Smoker  . Smokeless tobacco: Never Used  . Alcohol use Yes     Comment: rarely  . Drug use: No  . Sexual activity: Yes   Other Topics Concern  . Not on file   Social History Narrative  . No narrative on file    FAMILY HISTORY: Family History  Problem Relation Age of Onset  . Lung cancer Father 91  . Lung cancer Paternal Grandfather        dx in 89's, died at 54  . Heart attack Maternal Grandmother 58    ALLERGIES:  has No Known Allergies.  MEDICATIONS:  Current Outpatient Prescriptions  Medication Sig Dispense Refill  . capecitabine (XELODA) 500 MG tablet Take 4 tablets (2,000 mg total) by mouth 2 (two) times daily after a meal. Take on days 1-14 of chemotherapy. 112 tablet 3  . dexamethasone (DECADRON) 4 MG tablet Take 2 tablets (8 mg total) by mouth daily. Start the day after chemotherapy for 2 days. Take with food. 30 tablet 1  . LamoTRIgine XR 200 MG TB24 Take 400 mg by mouth at bedtime.  5  . lidocaine-prilocaine (EMLA) cream Apply 1 application topically as needed. 30 g 2  . Melatonin 3 MG TABS Take 3 mg by mouth at bedtime as needed (for sleep.).    Marland Kitchen Omega-3 Fatty Acids (FISH OIL PO) Take 2 capsules by mouth daily.    . ondansetron (ZOFRAN) 8 MG tablet Take 1 tablet (8 mg total) by mouth 2 (two) times daily as needed for refractory nausea / vomiting. Start on day 3 after chemotherapy. 30 tablet 1  . prochlorperazine (COMPAZINE) 10 MG tablet Take 1 tablet (10 mg total) by mouth every 6 (six) hours as needed (Nausea or vomiting). 30 tablet 1  . diphenoxylate-atropine (LOMOTIL) 2.5-0.025 MG tablet Take 2 tablets by mouth every 6 (six) hours as needed.  0  . FLUZONE QUADRIVALENT 0.5 ML injection TO BE ADMINISTERED BY PHARMACIST FOR IMMUNIZATION  0   No current facility-administered medications for this visit.     REVIEW OF SYSTEMS:   Constitutional: Denies fevers, chills or abnormal night sweats.   Eyes: Denies blurriness of vision, double vision or watery eyes Ears, nose, mouth, throat, and face: Denies mucositis or sore throat Respiratory: Denies cough, dyspnea or wheezes Cardiovascular: Denies palpitation, chest discomfort or lower extremity swelling Gastrointestinal:  Denies nausea, heartburn or change in bowel habits Skin: Denies abnormal skin rashes Lymphatics: Denies new lymphadenopathy or easy bruising Neurological:Denies numbness, tingling or new weaknesses Behavioral/Psych: Mood is stable, no new changes  All other systems were reviewed with the patient and are negative.  PHYSICAL EXAMINATION:  ECOG PERFORMANCE STATUS: 0  Vitals:   12/27/16 0927  BP: 133/66  Pulse: 74  Resp: 18  Temp: 98.3 F (36.8 C)  SpO2: 100%   Filed Weights   12/27/16 0927  Weight: 177 lb 14.4 oz (80.7 kg)    GENERAL:alert, no distress and comfortable SKIN: skin color, texture, turgor are normal, no rashes or significant lesions EYES: normal, conjunctiva are pink and non-injected, sclera clear OROPHARYNX:no exudate, no erythema and lips, buccal mucosa, and tongue normal  NECK: supple, thyroid normal size, non-tender, without nodularity LYMPH:  no palpable lymphadenopathy in the cervical, axillary or inguinal LUNGS: clear to auscultation and percussion with normal breathing effort HEART: regular rate & rhythm and no murmurs and no lower extremity edema ABDOMEN:abdomen soft, non-tender and normal bowel sounds. Multiple small surgical incisions have healed well. No discharge or surrounding skin erythema. No organmegaly. (+) 0.5 cm nodule in lower abdominal incision, healed well, no other palpable mass.  Musculoskeletal:no cyanosis of digits and no clubbing  PSYCH: alert & oriented x 3 with fluent speech NEURO: no focal motor/sensory deficits  LABORATORY DATA:  I have reviewed the data as listed CBC Latest Ref Rng & Units 12/27/2016 12/06/2016 11/12/2016  WBC 4.0 - 10.3 10e3/uL 5.4 7.3  10.4  Hemoglobin 13.0 - 17.1 g/dL 13.7 14.0 12.8(L)  Hematocrit 38.4 - 49.9 % 40.0 41.4 37.5(L)  Platelets 140 - 400 10e3/uL 201 206 195   CMP Latest Ref Rng & Units 12/27/2016 12/06/2016 11/12/2016  Glucose 70 - 140 mg/dl 74 140 96  BUN 7.0 - 26.0 mg/dL 12.1 17.0 10  Creatinine 0.7 - 1.3 mg/dL 0.9 1.2 1.02  Sodium 136 - 145 mEq/L 141 140 141  Potassium 3.5 - 5.1 mEq/L 4.0 3.8 3.9  Chloride 101 - 111 mmol/L - - 108  CO2 22 - 29 mEq/L 25 26 26   Calcium 8.4 - 10.4 mg/dL 9.2 9.5 8.8(L)  Total Protein 6.4 - 8.3 g/dL 6.4 7.2 -  Total Bilirubin 0.20 - 1.20 mg/dL 0.44 0.40 -  Alkaline Phos 40 - 150 U/L 74 97 -  AST 5 - 34 U/L 24 20 -  ALT 0 - 55 U/L 27 18 -    PATHOLOGY RESULTS:   Diagnosis 11/10/16 Colon, segmental resection for tumor, rectosigmoid ADENOCARCINOMA OF THE COLON, GRADE 3 (4.5 CM) THE TUMOR INVADES THROUGH THE MUSCULARIS PROPRIA INTO PERICOLONIC SOFT TISSUE (PT3) LYMPHOVASCULAR INVASION IS IDENTIFIED MARGINS OF RESECTION ARE NEGATIVE FOR CARCINOMA METASTATIC ADENOCARCINOMA IN FOUR OF TWENTY-TWO LYMPH NODES (4/22, PN2A)  Microscopic Comment COLON AND RECTUM (INCLUDING TRANS-ANAL RESECTION): Specimen: Rectosigmoid colon Procedure: Segmental resection Tumor site: descending colon Specimen integrity: Intact Macroscopic intactness of mesorectum: Not applicable: NA Complete: x Near complete: NA Incomplete: NA Cannot be determined (specify): NA Macroscopic tumor perforation: through the muscularis propria into the subserosal adipose tissue Invasive tumor: Maximum size: 4.5 cm Histologic type(s): Adenocarcinoma Histologic grade and differentiation: G1: well differentiated/low grade G2: moderately differentiated/low grade G3: poorly differentiated/high grade G4: undifferentiated/high grade Type of polyp in which invasive carcinoma arose: Tubular adenoma Microscopic extension of invasive tumor: The tumor invades through muscularis propria into the subserosal  adiposetissue Lymph-Vascular invasion: Identified Peri-neural invasion: Negative Tumor deposit(s) (discontinuous extramural extension): negative Resection margins:  Proximal margin: negative Distal margin: Negative Circumferential (radial) (posterior ascending, posterior descending; lateral and posterior mid-rectum; and entire lower 1/3 rectum):negative Mesenteric margin (sigmoid and transverse): Negative Distance closest margin (if all above margins negative): 0.1 cm to radial margin Trans-anal resection margins only: Deep margin: NA Mucosal Margin: NA Distance closest mucosal margin (if negative): NA Treatment effect (neo-adjuvant therapy): Negative Additional polyp(s): Negative Non-neoplastic findings: Unremarkable Lymph nodes: number examined 22; number positive: 4 Pathologic Staging: pT3, pN2a, pMx Ancillary studies: ordered  RADIOGRAPHIC STUDIES: I have personally reviewed the radiological images as listed and agreed with the findings in the report. Dg Chest 1 View  Result Date: 12/01/2016 CLINICAL DATA:  Porta catheter insertion EXAM: CHEST 1 VIEW COMPARISON:  Chest CT 10/13/2016 FINDINGS: There is a right IJ porta catheter with tip at the SVC level. Loose loop in the neck with no kink. Negative for pneumothorax. The lungs are clear. Normal heart size and mediastinal contours. IMPRESSION: Porta catheter with tip at the SVC. No pneumothorax or other acute finding. Electronically Signed   By: Monte Fantasia M.D.   On: 12/01/2016 16:09   Dg C-arm 1-60 Min-no Report  Result Date: 12/01/2016 Fluoroscopy was utilized by the requesting physician.  No radiographic interpretation.    ASSESSMENT: 36 y.o.  Caucasian male with PMH of seizure, presented with recurrent rectal bleeding   1. Left colon cancer of the sigmoid colon, invasive adenocarcinoma, GX,  pT3N2M0, stage IIIB, MSI stable -I previously reviewed his CT scan findings, and surgical pathology results in great details with  patient and his wife. He had several questions which I answered in great detail.  -He had a complete surgical resection with negative margins, the tumor was T3 lesion and 4/22 nodes were positive.  -Staging scan was negative for distant metastasis. -Due to the high-risk of recurrence, I recommend adjuvant chemotherapy CAPOX for 6 months. We discussed the option of switched to FOLFOX if he has significant side effects from CAPOX. He contracted C.  -He has been recovering very well from his 11/10/16 surgery. He had his port placed on 12/01/16 -He started CAPOX on 12/06/16 -Labs reviewed and within normal limits and adequate to proceed with cycle 2. -I discussed diarrhea is still a concern with chemotherapy. If his symptoms ever become significant or unexpected he knows to contact the clinic or ED, especially if he develops a fever.  -He has a 0.5 cm subcutaneous nodule in his lower abdominal incision. Will monitor.  -Follow up in 3 weeks   2. Weight loss and decreased appetite  -I encouraged him to eat more if he is able. We will refer him to our dietician service to assist him with this. We will consider supplements in the future to help with this following chemotherapy.  -He has gained some weight back lately. But after C. Diff episode his weight decreased.  -He would like to get back to his 180 lb weight. I suggest he drinks ensure boosts to help. He will see Dietician later today.   3. History of seizure  -Continue medication and a follow-up with his neurologist at [facility].  4. Recent C. Diff colitis 12/13/2016 -Discovered after cycle 1 of chemo -Completed 10 day Flagyl and Cipro treatment on 12/24/16 -his diarrhea has resolved now. We discussed that his stool test for C. Difficile will be positive for a while  -we discussed management of diarrhea after chemotherapy, and signs of recurrent acetaminophen infection, including fever, abdominal discomfort, and severe diarrhea. He knows to contact us  or go to the emergency room if he has recurrent diarrhea.  PLAN:  -Lab reviewed, adequate to treat, he will proceed with cycle 2 CAPOX today with full dose  -Lab, f/u and chemo CAPOX in 6 weeks  -He knows to call us if he has any significant side effects after chemotherapy  Orders Placed This Encounter  Procedures  . CBC with Differential    Standing Status:   Standing    Number of Occurrences:   50    Standing Expiration Date:   12/26/2021  . Comprehensive metabolic panel    Standing Status:   Standing    Number of Occurrences:   50    Standing Expiration Date:   12/26/2021     Truitt Merle, MD 12/27/2016   This document serves as a record of services personally performed by Truitt Merle, MD. It was created on her behalf by Joslyn Devon, a trained medical scribe. The creation of this record is based on the scribe's personal observations and the provider's statements to them. This document has been checked and approved by the attending provider.

## 2016-12-27 ENCOUNTER — Ambulatory Visit: Payer: No Typology Code available for payment source | Admitting: Nutrition

## 2016-12-27 ENCOUNTER — Other Ambulatory Visit (HOSPITAL_BASED_OUTPATIENT_CLINIC_OR_DEPARTMENT_OTHER): Payer: No Typology Code available for payment source

## 2016-12-27 ENCOUNTER — Ambulatory Visit (HOSPITAL_BASED_OUTPATIENT_CLINIC_OR_DEPARTMENT_OTHER): Payer: No Typology Code available for payment source

## 2016-12-27 ENCOUNTER — Ambulatory Visit (HOSPITAL_BASED_OUTPATIENT_CLINIC_OR_DEPARTMENT_OTHER): Payer: No Typology Code available for payment source | Admitting: Hematology

## 2016-12-27 ENCOUNTER — Telehealth: Payer: Self-pay

## 2016-12-27 ENCOUNTER — Ambulatory Visit: Payer: No Typology Code available for payment source

## 2016-12-27 VITALS — BP 133/66 | HR 74 | Temp 98.3°F | Resp 18 | Ht 72.0 in | Wt 177.9 lb

## 2016-12-27 DIAGNOSIS — C187 Malignant neoplasm of sigmoid colon: Secondary | ICD-10-CM

## 2016-12-27 DIAGNOSIS — Z5112 Encounter for antineoplastic immunotherapy: Secondary | ICD-10-CM | POA: Diagnosis not present

## 2016-12-27 DIAGNOSIS — R222 Localized swelling, mass and lump, trunk: Secondary | ICD-10-CM | POA: Diagnosis not present

## 2016-12-27 DIAGNOSIS — Z95828 Presence of other vascular implants and grafts: Secondary | ICD-10-CM | POA: Insufficient documentation

## 2016-12-27 DIAGNOSIS — R634 Abnormal weight loss: Secondary | ICD-10-CM

## 2016-12-27 DIAGNOSIS — R630 Anorexia: Secondary | ICD-10-CM

## 2016-12-27 LAB — CBC WITH DIFFERENTIAL/PLATELET
BASO%: 0.5 % (ref 0.0–2.0)
Basophils Absolute: 0 10*3/uL (ref 0.0–0.1)
EOS ABS: 0.1 10*3/uL (ref 0.0–0.5)
EOS%: 2.3 % (ref 0.0–7.0)
HEMATOCRIT: 40 % (ref 38.4–49.9)
HGB: 13.7 g/dL (ref 13.0–17.1)
LYMPH%: 30.5 % (ref 14.0–49.0)
MCH: 31 pg (ref 27.2–33.4)
MCHC: 34.2 g/dL (ref 32.0–36.0)
MCV: 90.8 fL (ref 79.3–98.0)
MONO#: 0.5 10*3/uL (ref 0.1–0.9)
MONO%: 8.4 % (ref 0.0–14.0)
NEUT%: 58.3 % (ref 39.0–75.0)
NEUTROS ABS: 3.2 10*3/uL (ref 1.5–6.5)
PLATELETS: 201 10*3/uL (ref 140–400)
RBC: 4.41 10*6/uL (ref 4.20–5.82)
RDW: 13 % (ref 11.0–14.6)
WBC: 5.4 10*3/uL (ref 4.0–10.3)
lymph#: 1.7 10*3/uL (ref 0.9–3.3)

## 2016-12-27 LAB — COMPREHENSIVE METABOLIC PANEL
ALK PHOS: 74 U/L (ref 40–150)
ALT: 27 U/L (ref 0–55)
ANION GAP: 8 meq/L (ref 3–11)
AST: 24 U/L (ref 5–34)
Albumin: 3.8 g/dL (ref 3.5–5.0)
BILIRUBIN TOTAL: 0.44 mg/dL (ref 0.20–1.20)
BUN: 12.1 mg/dL (ref 7.0–26.0)
CALCIUM: 9.2 mg/dL (ref 8.4–10.4)
CO2: 25 mEq/L (ref 22–29)
CREATININE: 0.9 mg/dL (ref 0.7–1.3)
Chloride: 108 mEq/L (ref 98–109)
Glucose: 74 mg/dl (ref 70–140)
Potassium: 4 mEq/L (ref 3.5–5.1)
Sodium: 141 mEq/L (ref 136–145)
TOTAL PROTEIN: 6.4 g/dL (ref 6.4–8.3)

## 2016-12-27 MED ORDER — OXALIPLATIN CHEMO INJECTION 100 MG/20ML
126.0000 mg/m2 | Freq: Once | INTRAVENOUS | Status: AC
  Administered 2016-12-27: 250 mg via INTRAVENOUS
  Filled 2016-12-27: qty 50

## 2016-12-27 MED ORDER — HEPARIN SOD (PORK) LOCK FLUSH 100 UNIT/ML IV SOLN
500.0000 [IU] | Freq: Once | INTRAVENOUS | Status: AC | PRN
  Administered 2016-12-27: 500 [IU]
  Filled 2016-12-27: qty 5

## 2016-12-27 MED ORDER — SODIUM CHLORIDE 0.9% FLUSH
10.0000 mL | Freq: Once | INTRAVENOUS | Status: AC
  Administered 2016-12-27: 10 mL
  Filled 2016-12-27: qty 10

## 2016-12-27 MED ORDER — PALONOSETRON HCL INJECTION 0.25 MG/5ML
INTRAVENOUS | Status: AC
  Filled 2016-12-27: qty 5

## 2016-12-27 MED ORDER — PALONOSETRON HCL INJECTION 0.25 MG/5ML
0.2500 mg | Freq: Once | INTRAVENOUS | Status: AC
  Administered 2016-12-27: 0.25 mg via INTRAVENOUS

## 2016-12-27 MED ORDER — DEXTROSE 5 % IV SOLN
Freq: Once | INTRAVENOUS | Status: AC
  Administered 2016-12-27: 11:00:00 via INTRAVENOUS

## 2016-12-27 MED ORDER — DEXAMETHASONE SODIUM PHOSPHATE 10 MG/ML IJ SOLN
10.0000 mg | Freq: Once | INTRAMUSCULAR | Status: AC
  Administered 2016-12-27: 10 mg via INTRAVENOUS

## 2016-12-27 MED ORDER — DEXAMETHASONE SODIUM PHOSPHATE 10 MG/ML IJ SOLN
INTRAMUSCULAR | Status: AC
  Filled 2016-12-27: qty 1

## 2016-12-27 MED ORDER — SODIUM CHLORIDE 0.9% FLUSH
10.0000 mL | INTRAVENOUS | Status: DC | PRN
  Administered 2016-12-27: 10 mL
  Filled 2016-12-27: qty 10

## 2016-12-27 NOTE — Progress Notes (Signed)
36 year old male diagnosed with left rectosigmoid colon cancer on Xeloda. He is a patient of Dr. Burr Medico.  PMH includes c-diff and seizures.  Medications include Xeloda, Decadron, Flagyl, omega 3 fatty acids, Zofran, and compazine.  Labs were reviewed.  Height: 6 feet. Weight: 177.9 pounds. UBW: 180-185 pounds. BMI: 24.13.  Patient reports appetite has improved now that c-diff has improved. He continues to exercise. He likes to eat healthier foods. He has been drinking protein shakes. Would like to have a recommendation of a more organic protein shake.  Nutrition Diagnosis: Food and Nutrition Related Knowledge Deficit related to colon cancer as evidenced by no prior need for nutrition related information.  Intervention: Educated patient on healthy ways to add calories and protein. Recommended Orgain protein for additional calories and protein. Questions answered and teach back method used.  Contact information given.  Provided fact sheets.  Monitoring, Evaluation, Goals: Patient will tolerate adequate calories and protein to maintain weight.  Next Visit: Patient will call with questions or concerns.

## 2016-12-27 NOTE — Telephone Encounter (Signed)
Printed avs and calender for upcoming appointment. Per 10/29 los.

## 2016-12-27 NOTE — Patient Instructions (Signed)
[facility] Discharge Instructions for Patients Receiving Chemotherapy  Today you received the following chemotherapy agent: Oxaliplatin  To help prevent nausea and vomiting after your treatment, we encourage you to take your nausea medication as prescribed by MD.  **DO NOT TAKE ZOFRAN FOR 3 DAYS AFTER CHEMOTHERAPY; MAY RESUME ON Thursday**   If you develop nausea and vomiting that is not controlled by your nausea medication, call the clinic.   BELOW ARE SYMPTOMS THAT SHOULD BE REPORTED IMMEDIATELY:  *FEVER GREATER THAN 100.5 F  *CHILLS WITH OR WITHOUT FEVER  NAUSEA AND VOMITING THAT IS NOT CONTROLLED WITH YOUR NAUSEA MEDICATION  *UNUSUAL SHORTNESS OF BREATH  *UNUSUAL BRUISING OR BLEEDING  TENDERNESS IN MOUTH AND THROAT WITH OR WITHOUT PRESENCE OF ULCERS  *URINARY PROBLEMS  *BOWEL PROBLEMS  UNUSUAL RASH Items with * indicate a potential emergency and should be followed up as soon as possible.  Feel free to call the clinic should you have any questions or concerns. The clinic phone number is [phone].  Please show the [facility] at check-in to the Emergency Department and triage nurse.  Oxaliplatin Injection What is this medicine? OXALIPLATIN (ox AL i PLA tin) is a chemotherapy drug. It targets fast dividing cells, like cancer cells, and causes these cells to die. This medicine is used to treat cancers of the colon and rectum, and many other cancers. This medicine may be used for other purposes; ask your health care provider or pharmacist if you have questions. COMMON BRAND NAME(S): Eloxatin What should I tell my health care provider before I take this medicine? They need to know if you have any of these conditions: -kidney disease -an unusual or allergic reaction to oxaliplatin, other chemotherapy, other medicines, foods, dyes, or preservatives -pregnant or trying to get pregnant -breast-feeding How should I use this medicine? This drug is given  as an infusion into a vein. It is administered in a hospital or clinic by a specially trained health care professional. Talk to your pediatrician regarding the use of this medicine in children. Special care may be needed. Overdosage: If you think you have taken too much of this medicine contact a poison control center or emergency room at once. NOTE: This medicine is only for you. Do not share this medicine with others. What if I miss a dose? It is important not to miss a dose. Call your doctor or health care professional if you are unable to keep an appointment. What may interact with this medicine? -medicines to increase blood counts like filgrastim, pegfilgrastim, sargramostim -probenecid -some antibiotics like amikacin, gentamicin, neomycin, polymyxin B, streptomycin, tobramycin -zalcitabine Talk to your doctor or health care professional before taking any of these medicines: -acetaminophen -aspirin -ibuprofen -ketoprofen -naproxen This list may not describe all possible interactions. Give your health care provider a list of all the medicines, herbs, non-prescription drugs, or dietary supplements you use. Also tell them if you smoke, drink alcohol, or use illegal drugs. Some items may interact with your medicine. What should I watch for while using this medicine? Your condition will be monitored carefully while you are receiving this medicine. You will need important blood work done while you are taking this medicine. This medicine can make you more sensitive to cold. Do not drink cold drinks or use ice. Cover exposed skin before coming in contact with cold temperatures or cold objects. When out in cold weather wear warm clothing and cover your mouth and nose to warm the air that goes  into your lungs. Tell your doctor if you get sensitive to the cold. This drug may make you feel generally unwell. This is not uncommon, as chemotherapy can affect healthy cells as well as cancer cells. Report  any side effects. Continue your course of treatment even though you feel ill unless your doctor tells you to stop. In some cases, you may be given additional medicines to help with side effects. Follow all directions for their use. Call your doctor or health care professional for advice if you get a fever, chills or sore throat, or other symptoms of a cold or flu. Do not treat yourself. This drug decreases your body's ability to fight infections. Try to avoid being around people who are sick. This medicine may increase your risk to bruise or bleed. Call your doctor or health care professional if you notice any unusual bleeding. Be careful brushing and flossing your teeth or using a toothpick because you may get an infection or bleed more easily. If you have any dental work done, tell your dentist you are receiving this medicine. Avoid taking products that contain aspirin, acetaminophen, ibuprofen, naproxen, or ketoprofen unless instructed by your doctor. These medicines may hide a fever. Do not become pregnant while taking this medicine. Women should inform their doctor if they wish to become pregnant or think they might be pregnant. There is a potential for serious side effects to an unborn child. Talk to your health care professional or pharmacist for more information. Do not breast-feed an infant while taking this medicine. Call your doctor or health care professional if you get diarrhea. Do not treat yourself. What side effects may I notice from receiving this medicine? Side effects that you should report to your doctor or health care professional as soon as possible: -allergic reactions like skin rash, itching or hives, swelling of the face, lips, or tongue -low blood counts - This drug may decrease the number of white blood cells, red blood cells and platelets. You may be at increased risk for infections and bleeding. -signs of infection - fever or chills, cough, sore throat, pain or difficulty  passing urine -signs of decreased platelets or bleeding - bruising, pinpoint red spots on the skin, black, tarry stools, nosebleeds -signs of decreased red blood cells - unusually weak or tired, fainting spells, lightheadedness -breathing problems -chest pain, pressure -cough -diarrhea -jaw tightness -mouth sores -nausea and vomiting -pain, swelling, redness or irritation at the injection site -pain, tingling, numbness in the hands or feet -problems with balance, talking, walking -redness, blistering, peeling or loosening of the skin, including inside the mouth -trouble passing urine or change in the amount of urine Side effects that usually do not require medical attention (report to your doctor or health care professional if they continue or are bothersome): -changes in vision -constipation -hair loss -loss of appetite -metallic taste in the mouth or changes in taste -stomach pain This list may not describe all possible side effects. Call your doctor for medical advice about side effects. You may report side effects to FDA at 1-800-FDA-1088. Where should I keep my medicine? This drug is given in a hospital or clinic and will not be stored at home. NOTE: This sheet is a summary. It may not cover all possible information. If you have questions about this medicine, talk to your doctor, pharmacist, or health care provider.  2018 Elsevier/Gold Standard (2007-09-12 17:22:47)

## 2016-12-28 ENCOUNTER — Encounter: Payer: Self-pay | Admitting: Hematology

## 2016-12-30 ENCOUNTER — Other Ambulatory Visit: Payer: Self-pay

## 2016-12-30 DIAGNOSIS — C187 Malignant neoplasm of sigmoid colon: Secondary | ICD-10-CM

## 2016-12-30 MED ORDER — PROCHLORPERAZINE MALEATE 10 MG PO TABS: 10.0000 mg | ORAL_TABLET | Freq: Four times a day (QID) | ORAL | 1 refills | Status: DC | PRN

## 2016-12-30 NOTE — Progress Notes (Signed)
  Oncology Nurse Navigator Documentation  Navigator Location: [facility] (12/30/16 5379)   )Navigator Encounter Type: Letter/Fax/Email (12/30/16 4327)                             Interventions: Coordination of Care;Other (Medication refill request for compazine) (12/30/16 6147)   Coordination of Care: Other (Medication refill) (12/30/16 0929)   Patient e-mailed to request a refill on his Compazine prescription. I spoke to Sharmon Leyden RN at Dr. Ernestina Penna desk to request refill on patient's behalf.     Acuity: Level 1 (12/30/16 0921)   Acuity Level 2: Other (medication refill  request) (12/30/16 5747)     Time Spent with Patient: 15 (12/30/16 3403)

## 2016-12-31 ENCOUNTER — Encounter: Payer: Self-pay | Admitting: Hematology

## 2017-01-11 MED FILL — CAPECITABINE 500 MG TABLET: 500 | 21 days supply | Qty: 112 | Fill #2

## 2017-01-17 ENCOUNTER — Ambulatory Visit: Payer: No Typology Code available for payment source

## 2017-01-17 ENCOUNTER — Encounter: Payer: Self-pay | Admitting: Nurse Practitioner

## 2017-01-17 ENCOUNTER — Telehealth: Payer: Self-pay

## 2017-01-17 ENCOUNTER — Other Ambulatory Visit (HOSPITAL_BASED_OUTPATIENT_CLINIC_OR_DEPARTMENT_OTHER): Payer: No Typology Code available for payment source

## 2017-01-17 ENCOUNTER — Ambulatory Visit (HOSPITAL_BASED_OUTPATIENT_CLINIC_OR_DEPARTMENT_OTHER): Payer: No Typology Code available for payment source | Admitting: Nurse Practitioner

## 2017-01-17 ENCOUNTER — Ambulatory Visit (HOSPITAL_BASED_OUTPATIENT_CLINIC_OR_DEPARTMENT_OTHER): Payer: No Typology Code available for payment source

## 2017-01-17 VITALS — BP 130/66 | HR 63 | Temp 98.1°F | Resp 18 | Ht 72.0 in | Wt 177.9 lb

## 2017-01-17 DIAGNOSIS — Z5111 Encounter for antineoplastic chemotherapy: Secondary | ICD-10-CM | POA: Diagnosis not present

## 2017-01-17 DIAGNOSIS — C19 Malignant neoplasm of rectosigmoid junction: Secondary | ICD-10-CM

## 2017-01-17 DIAGNOSIS — C187 Malignant neoplasm of sigmoid colon: Secondary | ICD-10-CM

## 2017-01-17 DIAGNOSIS — Z95828 Presence of other vascular implants and grafts: Secondary | ICD-10-CM

## 2017-01-17 LAB — CBC WITH DIFFERENTIAL/PLATELET
BASO%: 1.4 % (ref 0.0–2.0)
BASOS ABS: 0.1 10*3/uL (ref 0.0–0.1)
EOS%: 2.3 % (ref 0.0–7.0)
Eosinophils Absolute: 0.1 10*3/uL (ref 0.0–0.5)
HCT: 36.7 % — ABNORMAL LOW (ref 38.4–49.9)
HGB: 13 g/dL (ref 13.0–17.1)
LYMPH#: 2 10*3/uL (ref 0.9–3.3)
LYMPH%: 34.7 % (ref 14.0–49.0)
MCH: 32.3 pg (ref 27.2–33.4)
MCHC: 35.4 g/dL (ref 32.0–36.0)
MCV: 91.2 fL (ref 79.3–98.0)
MONO#: 0.5 10*3/uL (ref 0.1–0.9)
MONO%: 8.4 % (ref 0.0–14.0)
NEUT#: 3.1 10*3/uL (ref 1.5–6.5)
NEUT%: 53.2 % (ref 39.0–75.0)
Platelets: 152 10*3/uL (ref 140–400)
RBC: 4.03 10*6/uL — AB (ref 4.20–5.82)
RDW: 13.9 % (ref 11.0–14.6)
WBC: 5.8 10*3/uL (ref 4.0–10.3)

## 2017-01-17 LAB — COMPREHENSIVE METABOLIC PANEL
ALT: 30 U/L (ref 0–55)
AST: 30 U/L (ref 5–34)
Albumin: 4 g/dL (ref 3.5–5.0)
Alkaline Phosphatase: 83 U/L (ref 40–150)
Anion Gap: 9 mEq/L (ref 3–11)
BUN: 14.7 mg/dL (ref 7.0–26.0)
CALCIUM: 9.1 mg/dL (ref 8.4–10.4)
CHLORIDE: 106 meq/L (ref 98–109)
CO2: 25 meq/L (ref 22–29)
CREATININE: 0.9 mg/dL (ref 0.7–1.3)
EGFR: >60 mL/min/{1.73_m2} (ref >60)
Glucose: 83 mg/dl (ref 70–140)
POTASSIUM: 3.5 meq/L (ref 3.5–5.1)
SODIUM: 140 meq/L (ref 136–145)
Total Bilirubin: 0.79 mg/dL (ref 0.20–1.20)
Total Protein: 6.9 g/dL (ref 6.4–8.3)

## 2017-01-17 MED ORDER — DEXAMETHASONE SODIUM PHOSPHATE 10 MG/ML IJ SOLN
10.0000 mg | Freq: Once | INTRAMUSCULAR | Status: AC
  Administered 2017-01-17: 10 mg via INTRAVENOUS

## 2017-01-17 MED ORDER — PALONOSETRON HCL INJECTION 0.25 MG/5ML
0.2500 mg | Freq: Once | INTRAVENOUS | Status: AC
  Administered 2017-01-17: 0.25 mg via INTRAVENOUS

## 2017-01-17 MED ORDER — DEXAMETHASONE SODIUM PHOSPHATE 10 MG/ML IJ SOLN
INTRAMUSCULAR | Status: AC
  Filled 2017-01-17: qty 1

## 2017-01-17 MED ORDER — SODIUM CHLORIDE 0.9% FLUSH
10.0000 mL | INTRAVENOUS | Status: DC | PRN
  Administered 2017-01-17: 10 mL
  Filled 2017-01-17: qty 10

## 2017-01-17 MED ORDER — HEPARIN SOD (PORK) LOCK FLUSH 100 UNIT/ML IV SOLN
500.0000 [IU] | Freq: Once | INTRAVENOUS | Status: AC | PRN
  Administered 2017-01-17: 500 [IU]
  Filled 2017-01-17: qty 5

## 2017-01-17 MED ORDER — DEXTROSE 5 % IV SOLN
Freq: Once | INTRAVENOUS | Status: AC
  Administered 2017-01-17: 14:00:00 via INTRAVENOUS

## 2017-01-17 MED ORDER — PALONOSETRON HCL INJECTION 0.25 MG/5ML
INTRAVENOUS | Status: AC
  Filled 2017-01-17: qty 5

## 2017-01-17 MED ORDER — OXALIPLATIN CHEMO INJECTION 100 MG/20ML
125.0000 mg/m2 | Freq: Once | INTRAVENOUS | Status: AC
  Administered 2017-01-17: 250 mg via INTRAVENOUS
  Filled 2017-01-17: qty 50

## 2017-01-17 MED ORDER — SODIUM CHLORIDE 0.9% FLUSH
10.0000 mL | Freq: Once | INTRAVENOUS | Status: AC
  Administered 2017-01-17: 10 mL
  Filled 2017-01-17: qty 10

## 2017-01-17 MED ORDER — PROCHLORPERAZINE MALEATE 10 MG PO TABS: 10.0000 mg | ORAL_TABLET | Freq: Four times a day (QID) | ORAL | 3 refills | Status: DC | PRN

## 2017-01-17 NOTE — Telephone Encounter (Signed)
Printed avs and calender for upcomeing appointment in December. 11/19 los

## 2017-01-17 NOTE — Progress Notes (Addendum)
[place]  [phone] [phone]  Clinic Follow up Note   Patient Care Team: [name], MD as PCP - General (Family Medicine) Nehemiah Settle, MD (Gastroenterology) Leighton Ruff, MD as Consulting Physician (General Surgery) Truitt Merle, MD as Consulting Physician (Hematology) 01/17/2017  SUMMARY OF ONCOLOGIC HISTORY: Oncology History   Cancer Staging Colon cancer [facility]) Staging form: Colon and Rectum, AJCC 8th Edition - Pathologic stage from 11/10/2016: Stage IIIB (pT3, pN2a, cM0) - Signed by Truitt Merle, MD on 11/22/2016       Colon cancer ([place])   10/01/2016 Initial Diagnosis    Colon cancer ([place])      10/01/2016 Procedure    Colonoscopy by Dr. Melina Copa: There is an obviously malignant mass at 28 cm in the sigmoid colon. He was circumferential and the colonoscopy could not be passed through the lesions. Multiple biopsies taken.      10/01/2016 Initial Biopsy    Sigmoid colon mass biopsy showed invasive adenocarcinoma.      10/04/2016 Imaging    CT abdomen/pelvis - no signs of metastatic disease, several enlarged mesenteric lymph nodes      10/13/2016 Imaging    CT chest, abdomen and the pelvis with contrast and showed no evidence of distant recurrence.       10/21/2016 Genetic Testing    The patient had genetic testing due to a personal history of colon cancer at 36.  He was tested for the Common Hereditary Cancer Panel.  The Hereditary Gene Panel offered by [facility] includes sequencing and/or deletion duplication testing of the following 46 genes: APC, ATM, AXIN2, BARD1, BMPR1A, BRCA1, BRCA2, BRIP1, CDH1, CDKN2A (p14ARF), CDKN2A (p16INK4a), CHEK2, CTNNA1, DICER1, EPCAM (Deletion/duplication testing only), GREM1 (promoter region deletion/duplication testing only), KIT, MEN1, MLH1, MSH2, MSH3, MSH6, MUTYH, NBN, NF1, NHTL1, PALB2, PDGFRA, PMS2, POLD1, POLE, PTEN, RAD50, RAD51C, RAD51D, SDHB, SDHC, SDHD, SMAD4, SMARCA4. STK11, TP53, TSC1, TSC2, and  VHL.  The following genes were evaluated for sequence changes only: SDHA and HOXB13 c.251G>A variant only.  Results: No pathogenic mutations identified.  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  The date of this test report is 10/21/2016.       11/10/2016 Pathology Results    Diagnosis- surgical pathology Colon, segmental resection for tumor, rectosigmoid ADENOCARCINOMA OF THE COLON, GRADE 3 (4.5 CM) THE TUMOR INVADES THROUGH THE MUSCULARIS PROPRIA INTO PERICOLONIC SOFT TISSUE (PT3) LYMPHOVASCULAR INVASION IS IDENTIFIED MARGINS OF RESECTION ARE NEGATIVE FOR CARCINOMA METASTATIC ADENOCARCINOMA IN FOUR OF TWENTY-TWO LYMPH NODES (4/22, PN2A)      11/10/2016 Surgery     XI ROBOT ASSISTED SIGMOIDECTOMY  SURGEON:  Surgeon(s): Louise Boston, MD Leighton Ruff, MD       2/54/2706 Miscellaneous    Colon cancer MSI-stable       12/06/2016 -  Chemotherapy    CAPOX every 3 weeks for 6 months starting 12/06/16      CURRENT THERAPY: CAPOX every 3 weeks for 6 months starting 12/06/16  INTERVAL HISTORY: Mr. Kimmons returns for follow-up as scheduled prior to cycle 3 Capox.  He tolerated cycle 2 without difficulty, he continues to work full-time and exercise frequently.  Cold sensitivity and intermittent tingling to fingers lasting 8 days, resolved. Eating and drinking well, no weight loss.  He takes Compazine twice daily while on Xeloda for nausea prophylaxis, I refilled Compazine today.  Denies diarrhea, mucositis, or hand/foot redness or pain. He questions whether he needs decadron prescription, he has not  taken in the past.   REVIEW OF SYSTEMS:   Constitutional: Denies fatigue, fevers, chills or abnormal weight loss Eyes: Denies blurriness of vision Ears, nose, mouth, throat, and face: Denies mucositis or sore throat Respiratory: Denies cough, dyspnea or wheezes Cardiovascular: Denies palpitation, chest discomfort or lower extremity  swelling Gastrointestinal:  Denies nausea, vomiting, constipation, diarrhea, heartburn or change in bowel habits (+) Compazine twice daily while on Xeloda Skin: Denies abnormal skin rashes Lymphatics: Denies new lymphadenopathy or easy bruising Neurological:Denies numbness or new weaknesses (+) cold sensitivity lasting 8 days (+) intermittent tingling to fingertips x 8 days after Oxaliplatin Behavioral/Psych: Mood is stable, no new changes  All other systems were reviewed with the patient and are negative.  MEDICAL HISTORY:  Past Medical History:  Diagnosis Date  . Colon cancer ([place])   . Family history of lung cancer   . Right inguinal hernia   . Seizures ([place])    3 seizures 2016    SURGICAL HISTORY: Past Surgical History:  Procedure Laterality Date  . APPENDECTOMY  2006  . COLON SURGERY     for colon cancer  . HERNIA REPAIR    . HERNIA REPAIR INGUINAL ADULT Right 10/15/2011   Performed by Odis Hollingshead, MD at [facility] ORS  . INSERTION OF MESH N/A 10/15/2011   Performed by Odis Hollingshead, MD at [facility] ORS  . INSERTION PORT-A-CATH Right 12/01/2016   Performed by Leighton Ruff, MD at [facility] ORS  . left knee arthroscopy     1995  . PROCTOSCOPY  11/10/2016   Performed by Leighton Ruff, MD at [facility] ORS  . VASECTOMY N/A 10/15/2011   Performed by Claybon Jabs, MD at [facility] ORS  . XI ROBOT ASSISTED LAPAROSCOPIC SIGMOIDECTOMY N/A 11/10/2016   Performed by Leighton Ruff, MD at [facility] ORS    I have reviewed the social history and family history with the patient and they are unchanged from previous note.  ALLERGIES:  has No Known Allergies.  MEDICATIONS:  Current Outpatient Medications  Medication Sig Dispense Refill  . capecitabine (XELODA) 500 MG tablet Take 4 tablets (2,000 mg total) by mouth 2 (two) times daily after a meal. Take on days 1-14 of chemotherapy. 112 tablet 3  . diphenoxylate-atropine (LOMOTIL) 2.5-0.025 MG tablet Take 2 tablets by mouth every 6 (six) hours as needed.  0  .  FLUZONE QUADRIVALENT 0.5 ML injection TO BE ADMINISTERED BY PHARMACIST FOR IMMUNIZATION  0  . LamoTRIgine XR 200 MG TB24 Take 400 mg by mouth at bedtime.  5  . lidocaine-prilocaine (EMLA) cream Apply 1 application topically as needed. 30 g 2  . Melatonin 3 MG TABS Take 3 mg by mouth at bedtime as needed (for sleep.).    Marland Kitchen Omega-3 Fatty Acids (FISH OIL PO) Take 2 capsules by mouth daily.    . prochlorperazine (COMPAZINE) 10 MG tablet Take 1 tablet (10 mg total) every 6 (six) hours as needed by mouth (Nausea or vomiting). 40 tablet 3  . dexamethasone (DECADRON) 4 MG tablet Take 2 tablets (8 mg total) by mouth daily. Start the day after chemotherapy for 2 days. Take with food. (Patient not taking: Reported on 01/17/2017) 30 tablet 1  . ondansetron (ZOFRAN) 8 MG tablet Take 1 tablet (8 mg total) by mouth 2 (two) times daily as needed for refractory nausea / vomiting. Start on day 3 after chemotherapy. (Patient not taking: Reported on 01/17/2017) 30 tablet 1   No current facility-administered medications for this visit.  Facility-Administered Medications Ordered in Other Visits  Medication Dose Route Frequency Provider Last Rate Last Dose  . dexamethasone (DECADRON) injection 10 mg  10 mg Intravenous Once Truitt Merle, MD      . dextrose 5 % solution   Intravenous Once Truitt Merle, MD      . heparin lock flush 100 unit/mL  500 Units Intracatheter Once PRN Truitt Merle, MD      . oxaliplatin (ELOXATIN) 260 mg in dextrose 5 % 500 mL chemo infusion  130 mg/m2 (Treatment Plan Recorded) Intravenous Once Truitt Merle, MD      . palonosetron Leona Carry) injection 0.25 mg  0.25 mg Intravenous Once Truitt Merle, MD      . sodium chloride flush (NS) 0.9 % injection 10 mL  10 mL Intracatheter PRN Truitt Merle, MD        PHYSICAL EXAMINATION: ECOG PERFORMANCE STATUS: 0 - Asymptomatic  Vitals:   01/17/17 1259  BP: 130/66  Pulse: 63  Resp: 18  Temp: 98.1 F (36.7 C)  SpO2: 100%   Filed Weights   01/17/17 1259  Weight:  177 lb 14.4 oz (80.7 kg)    GENERAL:alert, no distress and comfortable SKIN: skin color, texture, turgor are normal, no rashes or significant lesions EYES: normal, Conjunctiva are pink and non-injected, sclera clear OROPHARYNX:no exudate, no erythema and lips, buccal mucosa, and tongue normal  NECK: supple, thyroid normal size, non-tender, without nodularity LYMPH:  no palpable cervical or supraclavicular lymphadenopathy LUNGS: clear to auscultation bilaterally with normal breathing effort HEART: regular rate & rhythm and no murmurs and no lower extremity edema ABDOMEN:abdomen soft, non-tender and normal bowel sounds.  Incision is well-healed Musculoskeletal:no cyanosis of digits and no clubbing  NEURO: alert & oriented x 3 with fluent speech, no focal motor/sensory deficits PAC without erythema, appearance of retained suture to right border of incision  LABORATORY DATA:  I have reviewed the data as listed CBC Latest Ref Rng & Units 01/17/2017 12/27/2016 12/06/2016  WBC 4.0 - 10.3 10e3/uL 5.8 5.4 7.3  Hemoglobin 13.0 - 17.1 g/dL 13.0 13.7 14.0  Hematocrit 38.4 - 49.9 % 36.7(L) 40.0 41.4  Platelets 140 - 400 10e3/uL 152 201 206     CMP Latest Ref Rng & Units 01/17/2017 12/27/2016 12/06/2016  Glucose 70 - 140 mg/dl 83 74 140  BUN 7.0 - 26.0 mg/dL 14.7 12.1 17.0  Creatinine 0.7 - 1.3 mg/dL 0.9 0.9 1.2  Sodium 136 - 145 mEq/L 140 141 140  Potassium 3.5 - 5.1 mEq/L 3.5 4.0 3.8  Chloride 101 - 111 mmol/L - - -  CO2 22 - 29 mEq/L 25 25 26   Calcium 8.4 - 10.4 mg/dL 9.1 9.2 9.5  Total Protein 6.4 - 8.3 g/dL 6.9 6.4 7.2  Total Bilirubin 0.20 - 1.20 mg/dL 0.79 0.44 0.40  Alkaline Phos 40 - 150 U/L 83 74 97  AST 5 - 34 U/L 30 24 20   ALT 0 - 55 U/L 30 27 18     RADIOGRAPHIC STUDIES: I have personally reviewed the radiological images as listed and agreed with the findings in the report. No results found.   ASSESSMENT & PLAN: 36 y.o.  Caucasian male with PMH of seizure, presented with  recurrent rectal bleeding   1. 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 Weight loss and decreased appetite 3. History of seizure 4. C.Diff colitis 12/13/16  Mr. Briel appears stable today, tolerating chemo well overall with minimal side effects.  Taking Compazine twice daily while on Xeloda to prevent nausea, I refilled this medication today.  He had c.diff with cycle 1 which caused weight loss. He tolerated cycle 2 well without difficulty, diarrhea is resolved, he has gained weight back. He has not taken po decadron with cycle 1 or 2; we discussed he can hold for now. If he has increased fatigue, decreased appetite or weight loss, or uncontrolled n/v, he can take 1 tablet daily for 3-5 days. Vital signs and weight stable, CBC and Cmet are unremarkable; he will proceed with cycle 3 CAPOX today. We reviewed cancer surveillance after completion of therapy. Return in 3 weeks for f/u and cycle 4.   PLAN Labs reviewed, proceed with Cycle 3 CAPOX today Refilled compazine  Po decadron 4 mg daily x3-5 days if severe fatigue, decreased appetite, weight loss, or uncontrolled n/v; he will hold for now; prescription previously sent to pharmacy  All questions were answered. The patient knows to call the clinic with any problems, questions or concerns. No barriers to learning was detected.     Alla Feeling, NP 01/17/17

## 2017-01-17 NOTE — Progress Notes (Signed)
  Oncology Nurse Navigator Documentation  Navigator Location: [facility] (01/17/17 1443)   )Navigator Encounter Type: Treatment (01/17/17 1443)                       Treatment Phase: Treatment (01/17/17 1443) Barriers/Navigation Needs: No barriers at this time (01/17/17 1443)   Interventions: Psycho-social support (01/17/17 1443)  Met with patient and wife in infusion room during treatment. No concerns or barriers voiced. Patient upbeat and encouraged that he is almost half-way through treatments. Patient verbalized understanding that he can contact me with questions or concerns.          Acuity: Level 1 (01/17/17 1443)         Time Spent with Patient: 15 (01/17/17 1443)

## 2017-01-17 NOTE — Patient Instructions (Signed)
[place] Discharge Instructions for Patients Receiving Chemotherapy  Today you received the following chemotherapy agent: Oxaliplatin  To help prevent nausea and vomiting after your treatment, we encourage you to take your nausea medication as prescribed by MD.  **DO NOT TAKE ZOFRAN FOR 3 DAYS AFTER CHEMOTHERAPY; MAY RESUME ON Thursday**   If you develop nausea and vomiting that is not controlled by your nausea medication, call the clinic.   BELOW ARE SYMPTOMS THAT SHOULD BE REPORTED IMMEDIATELY:  *FEVER GREATER THAN 100.5 F  *CHILLS WITH OR WITHOUT FEVER  NAUSEA AND VOMITING THAT IS NOT CONTROLLED WITH YOUR NAUSEA MEDICATION  *UNUSUAL SHORTNESS OF BREATH  *UNUSUAL BRUISING OR BLEEDING  TENDERNESS IN MOUTH AND THROAT WITH OR WITHOUT PRESENCE OF ULCERS  *URINARY PROBLEMS  *BOWEL PROBLEMS  UNUSUAL RASH Items with * indicate a potential emergency and should be followed up as soon as possible.  Feel free to call the clinic should you have any questions or concerns. The clinic phone number is [phone].  Please show the [facility] at check-in to the Emergency Department and triage nurse.  Oxaliplatin Injection What is this medicine? OXALIPLATIN (ox AL i PLA tin) is a chemotherapy drug. It targets fast dividing cells, like cancer cells, and causes these cells to die. This medicine is used to treat cancers of the colon and rectum, and many other cancers. This medicine may be used for other purposes; ask your health care provider or pharmacist if you have questions. COMMON BRAND NAME(S): Eloxatin What should I tell my health care provider before I take this medicine? They need to know if you have any of these conditions: -kidney disease -an unusual or allergic reaction to oxaliplatin, other chemotherapy, other medicines, foods, dyes, or preservatives -pregnant or trying to get pregnant -breast-feeding How should I use this medicine? This drug is given  as an infusion into a vein. It is administered in a hospital or clinic by a specially trained health care professional. Talk to your pediatrician regarding the use of this medicine in children. Special care may be needed. Overdosage: If you think you have taken too much of this medicine contact a poison control center or emergency room at once. NOTE: This medicine is only for you. Do not share this medicine with others. What if I miss a dose? It is important not to miss a dose. Call your doctor or health care professional if you are unable to keep an appointment. What may interact with this medicine? -medicines to increase blood counts like filgrastim, pegfilgrastim, sargramostim -probenecid -some antibiotics like amikacin, gentamicin, neomycin, polymyxin B, streptomycin, tobramycin -zalcitabine Talk to your doctor or health care professional before taking any of these medicines: -acetaminophen -aspirin -ibuprofen -ketoprofen -naproxen This list may not describe all possible interactions. Give your health care provider a list of all the medicines, herbs, non-prescription drugs, or dietary supplements you use. Also tell them if you smoke, drink alcohol, or use illegal drugs. Some items may interact with your medicine. What should I watch for while using this medicine? Your condition will be monitored carefully while you are receiving this medicine. You will need important blood work done while you are taking this medicine. This medicine can make you more sensitive to cold. Do not drink cold drinks or use ice. Cover exposed skin before coming in contact with cold temperatures or cold objects. When out in cold weather wear warm clothing and cover your mouth and nose to warm the air that goes  into your lungs. Tell your doctor if you get sensitive to the cold. This drug may make you feel generally unwell. This is not uncommon, as chemotherapy can affect healthy cells as well as cancer cells. Report  any side effects. Continue your course of treatment even though you feel ill unless your doctor tells you to stop. In some cases, you may be given additional medicines to help with side effects. Follow all directions for their use. Call your doctor or health care professional for advice if you get a fever, chills or sore throat, or other symptoms of a cold or flu. Do not treat yourself. This drug decreases your body's ability to fight infections. Try to avoid being around people who are sick. This medicine may increase your risk to bruise or bleed. Call your doctor or health care professional if you notice any unusual bleeding. Be careful brushing and flossing your teeth or using a toothpick because you may get an infection or bleed more easily. If you have any dental work done, tell your dentist you are receiving this medicine. Avoid taking products that contain aspirin, acetaminophen, ibuprofen, naproxen, or ketoprofen unless instructed by your doctor. These medicines may hide a fever. Do not become pregnant while taking this medicine. Women should inform their doctor if they wish to become pregnant or think they might be pregnant. There is a potential for serious side effects to an unborn child. Talk to your health care professional or pharmacist for more information. Do not breast-feed an infant while taking this medicine. Call your doctor or health care professional if you get diarrhea. Do not treat yourself. What side effects may I notice from receiving this medicine? Side effects that you should report to your doctor or health care professional as soon as possible: -allergic reactions like skin rash, itching or hives, swelling of the face, lips, or tongue -low blood counts - This drug may decrease the number of white blood cells, red blood cells and platelets. You may be at increased risk for infections and bleeding. -signs of infection - fever or chills, cough, sore throat, pain or difficulty  passing urine -signs of decreased platelets or bleeding - bruising, pinpoint red spots on the skin, black, tarry stools, nosebleeds -signs of decreased red blood cells - unusually weak or tired, fainting spells, lightheadedness -breathing problems -chest pain, pressure -cough -diarrhea -jaw tightness -mouth sores -nausea and vomiting -pain, swelling, redness or irritation at the injection site -pain, tingling, numbness in the hands or feet -problems with balance, talking, walking -redness, blistering, peeling or loosening of the skin, including inside the mouth -trouble passing urine or change in the amount of urine Side effects that usually do not require medical attention (report to your doctor or health care professional if they continue or are bothersome): -changes in vision -constipation -hair loss -loss of appetite -metallic taste in the mouth or changes in taste -stomach pain This list may not describe all possible side effects. Call your doctor for medical advice about side effects. You may report side effects to FDA at 1-800-FDA-1088. Where should I keep my medicine? This drug is given in a hospital or clinic and will not be stored at home. NOTE: This sheet is a summary. It may not cover all possible information. If you have questions about this medicine, talk to your doctor, pharmacist, or health care provider.  2018 [employer] (2007-09-12 17:22:47)

## 2017-02-03 MED FILL — CAPECITABINE 500 MG TABLET: 500 | 21 days supply | Qty: 112 | Fill #3

## 2017-02-06 ENCOUNTER — Telehealth: Payer: Self-pay | Admitting: *Deleted

## 2017-02-06 NOTE — Telephone Encounter (Signed)
Called patient to inform him the [facility] will be closed tomorrow 02/07/2017. Patient verbalized understanding.

## 2017-02-07 ENCOUNTER — Ambulatory Visit: Payer: No Typology Code available for payment source | Admitting: Nurse Practitioner

## 2017-02-07 ENCOUNTER — Ambulatory Visit: Payer: No Typology Code available for payment source

## 2017-02-07 ENCOUNTER — Other Ambulatory Visit: Payer: No Typology Code available for payment source

## 2017-02-08 ENCOUNTER — Ambulatory Visit (HOSPITAL_BASED_OUTPATIENT_CLINIC_OR_DEPARTMENT_OTHER): Payer: No Typology Code available for payment source

## 2017-02-08 ENCOUNTER — Telehealth: Payer: Self-pay | Admitting: *Deleted

## 2017-02-08 ENCOUNTER — Telehealth: Payer: Self-pay | Admitting: Pharmacist

## 2017-02-08 ENCOUNTER — Encounter: Payer: Self-pay | Admitting: Hematology

## 2017-02-08 ENCOUNTER — Other Ambulatory Visit (HOSPITAL_BASED_OUTPATIENT_CLINIC_OR_DEPARTMENT_OTHER): Payer: No Typology Code available for payment source

## 2017-02-08 ENCOUNTER — Ambulatory Visit (HOSPITAL_BASED_OUTPATIENT_CLINIC_OR_DEPARTMENT_OTHER): Payer: No Typology Code available for payment source | Admitting: Hematology

## 2017-02-08 VITALS — BP 136/73 | HR 91 | Temp 99.0°F | Resp 18 | Wt 180.8 lb

## 2017-02-08 DIAGNOSIS — C19 Malignant neoplasm of rectosigmoid junction: Secondary | ICD-10-CM

## 2017-02-08 DIAGNOSIS — R634 Abnormal weight loss: Secondary | ICD-10-CM | POA: Diagnosis not present

## 2017-02-08 DIAGNOSIS — R203 Hyperesthesia: Secondary | ICD-10-CM | POA: Diagnosis not present

## 2017-02-08 DIAGNOSIS — Z5111 Encounter for antineoplastic chemotherapy: Secondary | ICD-10-CM

## 2017-02-08 DIAGNOSIS — Z95828 Presence of other vascular implants and grafts: Secondary | ICD-10-CM

## 2017-02-08 DIAGNOSIS — C187 Malignant neoplasm of sigmoid colon: Secondary | ICD-10-CM

## 2017-02-08 DIAGNOSIS — R630 Anorexia: Secondary | ICD-10-CM | POA: Diagnosis not present

## 2017-02-08 LAB — CBC WITH DIFFERENTIAL/PLATELET
BASO%: 0.4 % (ref 0.0–2.0)
Basophils Absolute: 0 10*3/uL (ref 0.0–0.1)
EOS ABS: 0.1 10*3/uL (ref 0.0–0.5)
EOS%: 0.8 % (ref 0.0–7.0)
HCT: 37.8 % — ABNORMAL LOW (ref 38.4–49.9)
HGB: 13.2 g/dL (ref 13.0–17.1)
LYMPH%: 16.7 % (ref 14.0–49.0)
MCH: 32.5 pg (ref 27.2–33.4)
MCHC: 34.8 g/dL (ref 32.0–36.0)
MCV: 93.3 fL (ref 79.3–98.0)
MONO#: 0.8 10*3/uL (ref 0.1–0.9)
MONO%: 8.2 % (ref 0.0–14.0)
NEUT%: 73.9 % (ref 39.0–75.0)
NEUTROS ABS: 7.5 10*3/uL — AB (ref 1.5–6.5)
PLATELETS: 162 10*3/uL (ref 140–400)
RBC: 4.06 10*6/uL — AB (ref 4.20–5.82)
RDW: 19.8 % — ABNORMAL HIGH (ref 11.0–14.6)
WBC: 10.1 10*3/uL (ref 4.0–10.3)
lymph#: 1.7 10*3/uL (ref 0.9–3.3)

## 2017-02-08 LAB — COMPREHENSIVE METABOLIC PANEL
ALBUMIN: 4 g/dL (ref 3.5–5.0)
ALK PHOS: 96 U/L (ref 40–150)
ALT: 81 U/L — ABNORMAL HIGH (ref 0–55)
AST: 45 U/L — AB (ref 5–34)
Anion Gap: 11 mEq/L (ref 3–11)
BUN: 16.2 mg/dL (ref 7.0–26.0)
CALCIUM: 9.2 mg/dL (ref 8.4–10.4)
CHLORIDE: 104 meq/L (ref 98–109)
CO2: 24 mEq/L (ref 22–29)
CREATININE: 1.2 mg/dL (ref 0.7–1.3)
EGFR: >60 mL/min/{1.73_m2} (ref >60)
GLUCOSE: 86 mg/dL (ref 70–140)
Potassium: 4 mEq/L (ref 3.5–5.1)
SODIUM: 139 meq/L (ref 136–145)
Total Bilirubin: 0.81 mg/dL (ref 0.20–1.20)
Total Protein: 6.7 g/dL (ref 6.4–8.3)

## 2017-02-08 MED ORDER — HEPARIN SOD (PORK) LOCK FLUSH 100 UNIT/ML IV SOLN
500.0000 [IU] | Freq: Once | INTRAVENOUS | Status: AC | PRN
  Administered 2017-02-08: 500 [IU]
  Filled 2017-02-08: qty 5

## 2017-02-08 MED ORDER — DEXAMETHASONE SODIUM PHOSPHATE 10 MG/ML IJ SOLN
INTRAMUSCULAR | Status: AC
  Filled 2017-02-08: qty 1

## 2017-02-08 MED ORDER — PALONOSETRON HCL INJECTION 0.25 MG/5ML
INTRAVENOUS | Status: AC
  Filled 2017-02-08: qty 5

## 2017-02-08 MED ORDER — SODIUM CHLORIDE 0.9% FLUSH
10.0000 mL | Freq: Once | INTRAVENOUS | Status: AC
  Administered 2017-02-08: 10 mL
  Filled 2017-02-08: qty 10

## 2017-02-08 MED ORDER — DEXTROSE 5 % IV SOLN
Freq: Once | INTRAVENOUS | Status: AC
  Administered 2017-02-08: 15:00:00 via INTRAVENOUS

## 2017-02-08 MED ORDER — PALONOSETRON HCL INJECTION 0.25 MG/5ML
0.2500 mg | Freq: Once | INTRAVENOUS | Status: AC
  Administered 2017-02-08: 0.25 mg via INTRAVENOUS

## 2017-02-08 MED ORDER — SODIUM CHLORIDE 0.9% FLUSH
10.0000 mL | INTRAVENOUS | Status: DC | PRN
  Administered 2017-02-08: 10 mL
  Filled 2017-02-08: qty 10

## 2017-02-08 MED ORDER — DEXAMETHASONE SODIUM PHOSPHATE 10 MG/ML IJ SOLN
10.0000 mg | Freq: Once | INTRAMUSCULAR | Status: AC
  Administered 2017-02-08: 10 mg via INTRAVENOUS

## 2017-02-08 MED ORDER — SODIUM CHLORIDE 0.9 % IV SOLN
INTRAVENOUS | Status: DC
  Administered 2017-02-08: 13:00:00 via INTRAVENOUS

## 2017-02-08 MED ORDER — OXALIPLATIN CHEMO INJECTION 100 MG/20ML
250.0000 mg | Freq: Once | INTRAVENOUS | Status: AC
  Administered 2017-02-08: 250 mg via INTRAVENOUS
  Filled 2017-02-08: qty 40
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
## 2017-02-08 NOTE — Progress Notes (Signed)
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) Staging form: Colon and Rectum, AJCC 8th Edition - Pathologic stage from 11/10/2016: Stage IIIB (pT3, pN2a, cM0) - Signed by Truitt Merle, MD on 11/22/2016       Colon cancer ([place])   10/01/2016 Initial Diagnosis    Colon cancer ([place])      10/01/2016 Procedure    Colonoscopy by Dr. Melina Copa: There is an obviously malignant mass at 28 cm in the sigmoid colon. He was circumferential and the colonoscopy could not be passed through the lesions. Multiple biopsies taken.      10/01/2016 Initial Biopsy    Sigmoid colon mass biopsy showed invasive adenocarcinoma.      10/04/2016 Imaging    CT abdomen/pelvis - no signs of metastatic disease, several enlarged mesenteric lymph nodes      10/13/2016 Imaging    CT chest, abdomen and the pelvis with contrast and showed no evidence of distant recurrence.       10/21/2016 Genetic Testing    The patient had genetic testing due to a personal history of colon cancer at 43.  He was tested for the Common Hereditary Cancer Panel.  The Hereditary Gene Panel offered by [facility] includes sequencing and/or deletion duplication testing of the following 46 genes: APC, ATM, AXIN2, BARD1, BMPR1A, BRCA1, BRCA2, BRIP1, CDH1, CDKN2A (p14ARF), CDKN2A (p16INK4a), CHEK2, CTNNA1, DICER1, EPCAM (Deletion/duplication testing only), GREM1 (promoter region deletion/duplication testing only), KIT, MEN1, MLH1, MSH2, MSH3, MSH6, MUTYH, NBN, NF1, NHTL1, PALB2, PDGFRA, PMS2, POLD1, POLE, PTEN, RAD50,  RAD51C, RAD51D, SDHB, SDHC, SDHD, SMAD4, SMARCA4. STK11, TP53, TSC1, TSC2, and VHL.  The following genes were evaluated for sequence changes only: SDHA and HOXB13 c.251G>A variant only.  Results: No pathogenic mutations identified.  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  The date of this test report is 10/21/2016.       11/10/2016 Pathology Results    Diagnosis- surgical pathology Colon, segmental resection for tumor, rectosigmoid ADENOCARCINOMA OF THE COLON, GRADE 3 (4.5 CM) THE TUMOR INVADES THROUGH THE MUSCULARIS PROPRIA INTO PERICOLONIC SOFT TISSUE (PT3) LYMPHOVASCULAR INVASION IS IDENTIFIED MARGINS OF RESECTION ARE NEGATIVE FOR CARCINOMA METASTATIC ADENOCARCINOMA IN FOUR OF TWENTY-TWO LYMPH NODES (4/22, PN2A)      11/10/2016 Surgery     XI ROBOT ASSISTED SIGMOIDECTOMY  SURGEON:  Surgeon(s): Dennies Boston, MD Leighton Ruff, MD       3/53/6144 Miscellaneous    Colon cancer MSI-stable       12/06/2016 -  Chemotherapy    CAPOX every 3 weeks for 6 months starting 12/06/16        HISTORY OF PRESENTING ILLNESS:  Andres Wallace 36 y.o. male is here because of colorectal cancer. He presents today with his wife, Andres Wallace. He was referred by his surgeon Dr. Marcello Moores for his stage III colon cancer.   Pt reports that 12-14 years ago he was diagnosed with stomach ulcers. He also had a confirmation scope later which confirmed this.  Initially, he presented to his gastroenterologist (Dr Nehemiah Settle) complaining of a 1-2 month history of rectal  bleeding which he describes as maroon colored blood. He does note that over the Spring or early Summer that he experienced some bright-red blood as well. He also had developed some cramping abdominal pain throughout this time as well with his maroon colored stools. He was on an antibiotic at that time for a non-cancerous mole removal which he thought was the source of his pain. His pain resolved after  these antibiotics, but he reports that it later returned several weeks ago. He had a DRE performed which did confirm the presence of blood and a colonoscopy was subsequently also performed at that time which revealed mass of the sigmoid or descending colon. He had biopsy performed of this which showed invasive adenocarcinoma. His CEA level around this time was 1.7. On 10/04/16 he had a CT A/P performed which showed no signs of metastatic disease, but it did show several mesenteric lymph nodes which were enlarged. The next day on 10/05/16 he had follow-up with Dr Marcello Moores of [facility] who recommended a left hemicolectomy in a minimally invasive fashion. He was also referred to genetics and scheduled to have a CT Chest performed to complete his metastatic workup. On 11/10/16 he underwent left hemicolectomy which was performed without complication. He recovered from this well during his admission and was subsequently discharge on 11/12/16. His genetic screening was performed and was normal. He does not have a FHx of colon cancer. 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 He has one son and two daughters who are all young. His wife currently is out of work to take care of their children and the home, she previously worked as a Marine scientist for [employer].   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   Mr Higginson presents today for consult. Overall, he is doing well. He has been recovering from his surgery well. He notes that if he twists his abdomen too quickly  he will have some pain surrounding his incisional site, but otherwise this has not really bothered him. He also notes that he has lost approximately 10-12lbs due to some appetite issues. His appetite has been returning slowly, however.  His energy  levels have been good overall. He denies nausea, vomiting, changes in bowel habits, or any other associated symptoms.    CURRENT THERAPY: CAPOX every 3 weeks for 6 months starting 12/06/16    INTERVAL HISTORY:   KHYRI HINZMAN is here for a follow up and cycle 4 CAPOX. He presents in the infusion room today accompanied by his wife.  He notes his last cycle chemo went fine. He has cold sensitivity but it is manageable. It took him about 8-10 days to recover this time. He denies nausea or diarrhea. He denies neopathy. He missed chemo yesterday due to the snow so he had not started Xeloda. He has some skin dryness and peeling on his hands. His BMs are overall normal. He notes he has gained some weight lately. Last cycle infusion he felt he needed a nap. With Xeloda he is able to tolerate well. He denies any trouble with his port. His wife asks about other markers to test for. Overall he is doing moderately well.     MEDICAL HISTORY:  Past Medical History:  Diagnosis Date  . Colon cancer ([place])   .  Family history of lung cancer   . Right inguinal hernia   . Seizures ([place])    3 seizures 2016    SURGICAL HISTORY: Past Surgical History:  Procedure Laterality Date  . APPENDECTOMY  2006  . COLON SURGERY     for colon cancer  . HERNIA REPAIR    . INGUINAL HERNIA REPAIR  10/15/2011   Procedure: HERNIA REPAIR INGUINAL ADULT;  Surgeon: Odis Hollingshead, MD;  Location: [facility];  Service: General;  Laterality: Right;  with mesh   . left knee arthroscopy     1995  . PORTACATH PLACEMENT Right 12/01/2016   Procedure: INSERTION PORT-A-CATH;  Surgeon: Leighton Ruff, MD;  Location: [facility];  Service: General;  Laterality: Right;  . PROCTOSCOPY  11/10/2016   Procedure: PROCTOSCOPY;  Surgeon: Leighton Ruff, MD;  Location: [facility];  Service: General;;  . VASECTOMY  10/15/2011   Procedure: VASECTOMY;  Surgeon: Claybon Jabs, MD;  Location: [facility];  Service: Urology;  Laterality: N/A;    SOCIAL  HISTORY: Social History   Socioeconomic History  . Marital status: Married    Spouse name: Not on file  . Number of children: Not on file  . Years of education: Not on file  . Highest education level: Not on file  Social Needs  . Financial resource strain: Not on file  . Food insecurity - worry: Not on file  . Food insecurity - inability: Not on file  . Transportation needs - medical: Not on file  . Transportation needs - non-medical: Not on file  Occupational History  . Not on file  Tobacco Use  . Smoking status: Never Smoker  . Smokeless tobacco: Never Used  Substance and Sexual Activity  . Alcohol use: Yes    Comment: rarely  . Drug use: No  . Sexual activity: Yes  Other Topics Concern  . Not on file  Social History Narrative  . Not on file    FAMILY HISTORY: Family History  Problem Relation Age of Onset  . Lung cancer Father 55  . Lung cancer Paternal Grandfather        dx in 38's, died at 30  . Heart attack Maternal Grandmother 58    ALLERGIES:  has No Known Allergies.  MEDICATIONS:  Current Outpatient Medications  Medication Sig Dispense Refill  . capecitabine (XELODA) 500 MG tablet Take 4 tablets (2,000 mg total) by mouth 2 (two) times daily after a meal. Take on days 1-14 of chemotherapy. 112 tablet 3  . dexamethasone (DECADRON) 4 MG tablet Take 2 tablets (8 mg total) by mouth daily. Start the day after chemotherapy for 2 days. Take with food. (Patient not taking: Reported on 01/17/2017) 30 tablet 1  . diphenoxylate-atropine (LOMOTIL) 2.5-0.025 MG tablet Take 2 tablets by mouth every 6 (six) hours as needed.  0  . FLUZONE QUADRIVALENT 0.5 ML injection TO BE ADMINISTERED BY PHARMACIST FOR IMMUNIZATION  0  . LamoTRIgine XR 200 MG TB24 Take 400 mg by mouth at bedtime.  5  . lidocaine-prilocaine (EMLA) cream Apply 1 application topically as needed. 30 g 2  . Melatonin 3 MG TABS Take 3 mg by mouth at bedtime as needed (for sleep.).    Marland Kitchen Omega-3 Fatty Acids (FISH  OIL PO) Take 2 capsules by mouth daily.    . ondansetron (ZOFRAN) 8 MG tablet Take 1 tablet (8 mg total) by mouth 2 (two) times daily as needed for refractory nausea / vomiting. Start on day 3 after chemotherapy. (  Patient not taking: Reported on 01/17/2017) 30 tablet 1  . prochlorperazine (COMPAZINE) 10 MG tablet Take 1 tablet (10 mg total) every 6 (six) hours as needed by mouth (Nausea or vomiting). 40 tablet 3   No current facility-administered medications for this visit.     REVIEW OF SYSTEMS:   Constitutional: Denies fevers, chills or abnormal night sweats. (+) weight gain Eyes: Denies blurriness of vision, double vision or watery eyes Ears, nose, mouth, throat, and face: Denies mucositis or sore throat Respiratory: Denies cough, dyspnea or wheezes Cardiovascular: Denies palpitation, chest discomfort or lower extremity swelling Gastrointestinal:  Denies nausea, heartburn or change in bowel habits Skin: Denies abnormal skin rashes (+) skin dryness and peeling on hands Lymphatics: Denies new lymphadenopathy or easy bruising Neurological:Denies numbness, tingling or new weaknesses (+) cold sensitivity  Behavioral/Psych: Mood is stable, no new changes  All other systems were reviewed with the patient and are negative.  PHYSICAL EXAMINATION:  ECOG PERFORMANCE STATUS: 0 Weight 180 pounds, blood pressure 136/73, heart rate 91, respiratory rate 18, temperature 37.2, pulse ox 98% on room air GENERAL:alert, no distress and comfortable SKIN: skin color, texture, turgor are normal, no rashes or significant lesions EYES: normal, conjunctiva are pink and non-injected, sclera clear OROPHARYNX:no exudate, no erythema and lips, buccal mucosa, and tongue normal  NECK: supple, thyroid normal size, non-tender, without nodularity LYMPH:  no palpable lymphadenopathy in the cervical, axillary or inguinal LUNGS: clear to auscultation and percussion with normal breathing effort HEART: regular rate & rhythm  and no murmurs and no lower extremity edema ABDOMEN:abdomen soft, non-tender and normal bowel sounds. Multiple small surgical incisions have healed well. No discharge or surrounding skin erythema. No organomegaly. (+) 0.5 cm nodule in lower abdominal incision, healed well, no other palpable mass.  Musculoskeletal:no cyanosis of digits and no clubbing  PSYCH: alert & oriented x 3 with fluent speech NEURO: no focal motor/sensory deficits  LABORATORY DATA:  I have reviewed the data as listed CBC Latest Ref Rng & Units 02/08/2017 01/17/2017 12/27/2016  WBC 4.0 - 10.3 10e3/uL 10.1 5.8 5.4  Hemoglobin 13.0 - 17.1 g/dL 13.2 13.0 13.7  Hematocrit 38.4 - 49.9 % 37.8(L) 36.7(L) 40.0  Platelets 140 - 400 10e3/uL 162 152 201   CMP Latest Ref Rng & Units 02/08/2017 01/17/2017 12/27/2016  Glucose 70 - 140 mg/dl 86 83 74  BUN 7.0 - 26.0 mg/dL 16.2 14.7 12.1  Creatinine 0.7 - 1.3 mg/dL 1.2 0.9 0.9  Sodium 136 - 145 mEq/L 139 140 141  Potassium 3.5 - 5.1 mEq/L 4.0 3.5 4.0  Chloride 101 - 111 mmol/L - - -  CO2 22 - 29 mEq/L 24 25 25   Calcium 8.4 - 10.4 mg/dL 9.2 9.1 9.2  Total Protein 6.4 - 8.3 g/dL 6.7 6.9 6.4  Total Bilirubin 0.20 - 1.20 mg/dL 0.81 0.79 0.44  Alkaline Phos 40 - 150 U/L 96 83 74  AST 5 - 34 U/L 45(H) 30 24  ALT 0 - 55 U/L 81(H) 30 27    PATHOLOGY RESULTS:   Diagnosis 11/10/16 Colon, segmental resection for tumor, rectosigmoid ADENOCARCINOMA OF THE COLON, GRADE 3 (4.5 CM) THE TUMOR INVADES THROUGH THE MUSCULARIS PROPRIA INTO PERICOLONIC SOFT TISSUE (PT3) LYMPHOVASCULAR INVASION IS IDENTIFIED MARGINS OF RESECTION ARE NEGATIVE FOR CARCINOMA METASTATIC ADENOCARCINOMA IN FOUR OF TWENTY-TWO LYMPH NODES (4/22, PN2A)  Microscopic Comment COLON AND RECTUM (INCLUDING TRANS-ANAL RESECTION): Specimen: Rectosigmoid colon Procedure: Segmental resection Tumor site: descending colon Specimen integrity: Intact Macroscopic intactness of mesorectum: Not applicable: NA Complete:  x Near  complete: NA Incomplete: NA Cannot be determined (specify): NA Macroscopic tumor perforation: through the muscularis propria into the subserosal adipose tissue Invasive tumor: Maximum size: 4.5 cm Histologic type(s): Adenocarcinoma Histologic grade and differentiation: G1: well differentiated/low grade G2: moderately differentiated/low grade G3: poorly differentiated/high grade G4: undifferentiated/high grade Type of polyp in which invasive carcinoma arose: Tubular adenoma Microscopic extension of invasive tumor: The tumor invades through muscularis propria into the subserosal adiposetissue Lymph-Vascular invasion: Identified Peri-neural invasion: Negative Tumor deposit(s) (discontinuous extramural extension): negative Resection margins: Proximal margin: negative Distal margin: Negative Circumferential (radial) (posterior ascending, posterior descending; lateral and posterior mid-rectum; and entire lower 1/3 rectum):negative Mesenteric margin (sigmoid and transverse): Negative Distance closest margin (if all above margins negative): 0.1 cm to radial margin Trans-anal resection margins only: Deep margin: NA Mucosal Margin: NA Distance closest mucosal margin (if negative): NA Treatment effect (neo-adjuvant therapy): Negative Additional polyp(s): Negative Non-neoplastic findings: Unremarkable Lymph nodes: number examined 22; number positive: 4 Pathologic Staging: pT3, pN2a, pMx Ancillary studies: ordered  RADIOGRAPHIC STUDIES: I have personally reviewed the radiological images as listed and agreed with the findings in the report. No results found.  ASSESSMENT: 36 y.o.  Caucasian male with PMH of seizure, presented with recurrent rectal bleeding   1. Left colon cancer of the rectosigmoid colon, invasive adenocarcinoma, GX,  pT3N2M0, stage IIIB, MSI stable -I previously reviewed his CT scan findings, and surgical pathology results in great details with patient and his wife. He  had several questions which I answered in great detail.  -He had a complete surgical resection with negative margins, the tumor was T3 lesion and 4/22 nodes were positive.  -Staging scan was negative for distant metastasis. -Due to the high-risk of recurrence, I recommend adjuvant chemotherapy CAPOX for 6 months. We discussed the option of switched to FOLFOX if he has significant side effects from CAPOX. He contracted C.  -He has been recovering very well from his 11/10/16 surgery. He had his port placed on 12/01/16 -He started CAPOX on 12/06/16 tolerating moderately well so far.  -He has a 0.5 cm subcutaneous nodule in his lower abdominal incision. Will monitor.  -I discussed cancer surveillance again. He will continue with CEA testing every 3-6 months and next CT scan after chemo treatments are done. If he develops symptoms of recurrence we will scan sooner.  -Labs reviewed, ANC 7.5, slightly elevated liver enzymes, likely secondary to chemotherapy, overall adequate for cycle 4 today, will start Xeloda today as well.  -F/u on 12/31 for cycle 5  -Plan for a total of 8 cycles if he tolerates well.  He is tolerating well overall, no significant neuropathy.   2. Weight loss and decreased appetite  -I encouraged him to eat more if he is able. We will refer him to our dietician service to assist him with this. We will consider supplements in the future to help with this following chemotherapy.  -He has gained some weight back lately. But after C. Diff episode his weight decreased.  -He would like to get back to his 180 lb weight. I suggest he drinks ensure boosts to help.  -If he has increased fatigue, decreased appetite or weight loss, or uncontrolled n/v, he can take 1 tablet dexa daily for 3-5 days -He has been gaining weight lately  3. History of seizure  -Continue medication and a follow-up with his neurologist at [facility].  4. C. Diff colitis 12/13/2016 -Discovered after cycle 1 of  chemo -Completed 10 day Flagyl and Cipro  treatment on 12/24/16 -Soft.  No recurrent diarrhea.  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   PLAN: -Labs reviewed, adequate to proceed with cycle 4 CAPOX today  -lab, flush, f/u and CAPOX on 12/31    No orders of the defined types were placed in this encounter.    Truitt Merle, MD 02/08/2017   This document serves as a record of services personally performed by Truitt Merle, MD. It was created on her behalf by Joslyn Devon, a trained medical scribe. The creation of this record is based on the scribe's personal observations and the provider's statements to them.    I have reviewed the above documentation for accuracy and completeness, and I agree with the above.

## 2017-02-08 NOTE — Telephone Encounter (Signed)
Dr. Burr Medico notified of pt's MyChart message from today regarding missed chemo treatment yesterday 12/10 due to weather issue.  Spoke with pt and informed him that Dr. Burr Medico will decide on chemo rescheduling soon.  Pt understood he will be contacted with further instructions.

## 2017-02-08 NOTE — Progress Notes (Signed)
Ok to treat with ALT of 81 today per Dr. Burr Medico

## 2017-02-08 NOTE — Patient Instructions (Addendum)
[facility] Discharge Instructions for Patients Receiving Chemotherapy  Today you received the following chemotherapy agent: Oxaliplatin  To help prevent nausea and vomiting after your treatment, we encourage you to take your nausea medication as prescribed by MD.  **DO NOT TAKE ZOFRAN FOR 3 DAYS AFTER CHEMOTHERAPY; MAY RESUME ON Thursday**   If you develop nausea and vomiting that is not controlled by your nausea medication, call the clinic.   BELOW ARE SYMPTOMS THAT SHOULD BE REPORTED IMMEDIATELY:  *FEVER GREATER THAN 100.5 F  *CHILLS WITH OR WITHOUT FEVER  NAUSEA AND VOMITING THAT IS NOT CONTROLLED WITH YOUR NAUSEA MEDICATION  *UNUSUAL SHORTNESS OF BREATH  *UNUSUAL BRUISING OR BLEEDING  TENDERNESS IN MOUTH AND THROAT WITH OR WITHOUT PRESENCE OF ULCERS  *URINARY PROBLEMS  *BOWEL PROBLEMS  UNUSUAL RASH Items with * indicate a potential emergency and should be followed up as soon as possible.  Feel free to call the clinic should you have any questions or concerns. The clinic phone number is [phone].  Please show the [place] at check-in to the Emergency Department and triage nurse.

## 2017-02-13 ENCOUNTER — Telehealth: Payer: Self-pay | Admitting: Hematology

## 2017-02-13 NOTE — Telephone Encounter (Signed)
No 12/11 los at check out

## 2017-02-16 ENCOUNTER — Other Ambulatory Visit: Payer: Self-pay | Admitting: Hematology

## 2017-02-16 ENCOUNTER — Other Ambulatory Visit: Payer: Self-pay | Admitting: *Deleted

## 2017-02-16 DIAGNOSIS — C187 Malignant neoplasm of sigmoid colon: Secondary | ICD-10-CM

## 2017-02-16 MED ORDER — CAPECITABINE 500 MG PO TABS: 1000.0000 mg/m2 | ORAL_TABLET | Freq: Two times a day (BID) | ORAL | 1 refills | Status: DC

## 2017-02-21 ENCOUNTER — Telehealth: Payer: Self-pay | Admitting: *Deleted

## 2017-02-21 NOTE — Telephone Encounter (Signed)
0855 -    Pt called stated he had C. Diff 2 weeks ago, and was prescribed antibiotics which he completed the course.  Stated he feels like having C. Diff again, and wanted to know if he could have antibiotics again.   Spoke with pt and was informed that pt will take last dose of Xeloda today.  Stated he had diarrhea 6 - 7 times yesterday. Took Imodium twice only.  Stated he also has Lomotil but did not take it.  Stated Imodium did help some.  Pt reported diarrhea x 2 today, but did not take Imodium.  Went over directions how to take Imodium alternating with Lomotil with pt.  Reinforced increased fluids intake as tolerated.  Pt understood to call office back by noon if diarrhea not resolved. Lucianne Lei, PA symptom management notified.  PA agreed with nurse assessment. Pt's    Phone    [phone].

## 2017-02-23 ENCOUNTER — Other Ambulatory Visit: Payer: Self-pay | Admitting: Medical

## 2017-02-23 ENCOUNTER — Telehealth: Payer: Self-pay | Admitting: *Deleted

## 2017-02-23 MED ORDER — METRONIDAZOLE 500 MG PO TABS: 500.0000 mg | ORAL_TABLET | Freq: Three times a day (TID) | ORAL | 0 refills | Status: DC

## 2017-02-23 NOTE — Telephone Encounter (Signed)
Received vm call from pt stating that he had C-diff @ 1.5 mo ago & Sandi Mealy PA treated him for C-diff with two antibiotics & symptoms cleared but he thinks it is back.  He reports same symptoms with multiple BMs (6-8/d) with foul odor & cramps.  He states that he is scheduled for treatment on Monday & would like to be better for this.  Discussed with Sandi Mealy & he states he will send in script for C-diff.  Notified pt.

## 2017-02-24 MED FILL — CAPECITABINE 500 MG TABLET: 500 | 21 days supply | Qty: 112 | Fill #0

## 2017-02-24 NOTE — Telephone Encounter (Signed)
My nurse, please call pt to follow up. If he still has watery BM>3 times a day, he should go to local ED/urgent care or here for stool c-diff test. Will not call in antibiotics unless stool test (+). Thanks   Truitt Merle MD

## 2017-02-27 NOTE — Progress Notes (Addendum)
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 Melina Copa: There is an obviously malignant mass at 28 cm in the sigmoid colon. He was circumferential and the colonoscopy could not be passed through the lesions. Multiple biopsies taken.      10/01/2016 Initial Biopsy    Sigmoid colon mass biopsy showed invasive adenocarcinoma.      10/04/2016 Imaging    CT abdomen/pelvis - no signs of metastatic disease, several enlarged mesenteric lymph nodes      10/13/2016 Imaging    CT chest, abdomen and the pelvis with contrast and showed no evidence of distant recurrence.       10/21/2016 Genetic Testing    The patient had genetic testing due to a personal history of colon cancer at 46.  He was tested for the Common Hereditary Cancer Panel.  The Hereditary Gene Panel offered by [facility] includes sequencing and/or deletion duplication testing of the following 46 genes: APC, ATM, AXIN2, BARD1, BMPR1A, BRCA1, BRCA2, BRIP1, CDH1, CDKN2A (p14ARF), CDKN2A (p16INK4a), CHEK2, CTNNA1, DICER1, EPCAM (Deletion/duplication testing only), GREM1 (promoter region deletion/duplication testing only), KIT, MEN1, MLH1, MSH2, MSH3, MSH6, MUTYH, NBN, NF1, NHTL1, PALB2, PDGFRA, PMS2, POLD1, POLE, PTEN, RAD50,  RAD51C, RAD51D, SDHB, SDHC, SDHD, SMAD4, SMARCA4. STK11, TP53, TSC1, TSC2, and VHL.  The following genes were evaluated for sequence changes only: SDHA and HOXB13 c.251G>A variant only.  Results: No pathogenic mutations identified.  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  The date of this test report is 10/21/2016.       11/10/2016 Pathology Results    Diagnosis- surgical pathology Colon, segmental resection for tumor, rectosigmoid ADENOCARCINOMA OF THE COLON, GRADE 3 (4.5 CM) THE TUMOR INVADES THROUGH THE MUSCULARIS PROPRIA INTO PERICOLONIC SOFT TISSUE (PT3) LYMPHOVASCULAR INVASION IS IDENTIFIED MARGINS OF RESECTION ARE NEGATIVE FOR CARCINOMA METASTATIC ADENOCARCINOMA IN FOUR OF TWENTY-TWO LYMPH NODES (4/22, PN2A)      11/10/2016 Surgery     XI ROBOT ASSISTED SIGMOIDECTOMY  SURGEON:  Surgeon(s): Caetano Boston, MD Leighton Ruff, MD       4/54/0981 Miscellaneous    Colon cancer MSI-stable       12/06/2016 -  Chemotherapy    CAPOX every 3 weeks for 6 months starting 12/06/16        HISTORY OF PRESENTING ILLNESS:  Andres Wallace 36 y.o. male is here because of colorectal cancer. He presents today with his wife, Andres Wallace. He was referred by his surgeon Dr. Marcello Moores for his stage III colon cancer.   Pt reports that 12-14 years ago he was diagnosed with stomach ulcers. He also had a confirmation scope later which confirmed this.  Initially, he presented to his gastroenterologist (Dr Nehemiah Settle) complaining of a 1-2 month history of rectal  bleeding which he describes as maroon colored blood. He does note that over the Spring or early Summer that he experienced some bright-red blood as well. He also had developed some cramping abdominal pain throughout this time as well with his maroon colored stools. He was on an antibiotic at that time for a non-cancerous mole removal which he thought was the source of his pain. His pain resolved after  these antibiotics, but he reports that it later returned several weeks ago. He had a DRE performed which did confirm the presence of blood and a colonoscopy was subsequently also performed at that time which revealed mass of the sigmoid or descending colon. He had biopsy performed of this which showed invasive adenocarcinoma. His CEA level around this time was 1.7. On 10/04/16 he had a CT A/P performed which showed no signs of metastatic disease, but it did show several mesenteric lymph nodes which were enlarged. The next day on 10/05/16 he had follow-up with Dr Marcello Moores of [facility] who recommended a left hemicolectomy in a minimally invasive fashion. He was also referred to genetics and scheduled to have a CT Chest performed to complete his metastatic workup. On 11/10/16 he underwent left hemicolectomy which was performed without complication. He recovered from this well during his admission and was subsequently discharge on 11/12/16. His genetic screening was performed and was normal. He does not have a FHx of colon cancer. 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 He has one son and two daughters who are all young. His wife currently is out of work to take care of their children and the home, she previously worked as a Marine scientist for [employer].   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   [name] presents today for consult. Overall, he is doing well. He has been recovering from his surgery well. He notes that if he twists his abdomen too quickly  he will have some pain surrounding his incisional site, but otherwise this has not really bothered him. He also notes that he has lost approximately 10-12lbs due to some appetite issues. His appetite has been returning slowly, however.  His energy  levels have been good overall. He denies nausea, vomiting, changes in bowel habits, or any other associated symptoms.    CURRENT THERAPY: CAPOX every 3 weeks for 6 months starting 12/06/16    INTERVAL HISTORY:   YAVIER SNIDER is here for a follow up and cycle 5 CAPOX. He presents today accompanied by his wife. He notes that his Holidays were well.   He had C. Diff like symptoms and Andres Mealy, PA-C called him in Flagyl Rx which he started 5 days ago and will complete this week. He reports that he now has substance to his bowel movements and notes that he had loose bowels prior to abx treatment. He notes that he is able to maintain his ADL without issues. He denies needing refills for any of his medications.    On review of systems, he reports resolved abdominal cramping with abx use, he states that his "cold sensitivity" is now lasting 12 days following treatment. He has fatigue following his chemotherapy treatments. He denies neuropathy and any other symptoms.       MEDICAL HISTORY:  Past Medical History:  Diagnosis Date  . Colon cancer ([place])   . Family history of lung cancer   . Right inguinal hernia   . Seizures ([place])    3 seizures 2016    SURGICAL HISTORY: Past Surgical History:  Procedure Laterality Date  . APPENDECTOMY  2006  . COLON SURGERY     for colon cancer  . HERNIA REPAIR    . INGUINAL HERNIA REPAIR  10/15/2011   Procedure: HERNIA REPAIR INGUINAL ADULT;  Surgeon: Odis Hollingshead, MD;  Location: [facility];  Service: General;  Laterality: Right;  with mesh   . left knee arthroscopy     1995  . PORTACATH PLACEMENT Right 12/01/2016   Procedure: INSERTION PORT-A-CATH;  Surgeon: Leighton Ruff, MD;  Location: [facility];  Service: General;  Laterality: Right;  . PROCTOSCOPY  11/10/2016   Procedure: PROCTOSCOPY;  Surgeon: Leighton Ruff, MD;  Location: [facility];  Service: General;;  . VASECTOMY  10/15/2011   Procedure: VASECTOMY;  Surgeon: Claybon Jabs, MD;  Location: [facility];   Service: Urology;  Laterality: N/A;    SOCIAL HISTORY: Social History   Socioeconomic History  . Marital status: Married    Spouse name: Not on file  . Number of children: Not on file  . Years of education: Not on file  . Highest education level: Not on file  Social Needs  . Financial resource strain: Not on file  . Food insecurity - worry: Not on file  . Food insecurity - inability: Not on file  . Transportation needs - medical: Not on file  . Transportation needs - non-medical: Not on file  Occupational History  . Not on file  Tobacco Use  . Smoking status: Never Smoker  . Smokeless tobacco: Never Used  Substance and Sexual Activity  . Alcohol use: Yes    Comment: rarely  . Drug use: No  . Sexual activity: Yes  Other Topics Concern  . Not on file  Social History Narrative  . Not on file    FAMILY HISTORY: Family History  Problem Relation Age of Onset  . Lung cancer Father 17  . Lung cancer Paternal Grandfather        dx in 25's, died at 16  . Heart attack Maternal Grandmother 58    ALLERGIES:  has No Known Allergies.  MEDICATIONS:  Current Outpatient Medications  Medication Sig Dispense Refill  . capecitabine (XELODA) 500 MG tablet Take 4 tablets (2,000 mg total) by mouth 2 (two) times daily after a meal. Take on days 1-14 of chemotherapy. 112 tablet 1  . diphenoxylate-atropine (LOMOTIL) 2.5-0.025 MG tablet Take 2 tablets by mouth every 6 (six) hours as needed.  0  . LamoTRIgine XR 200 MG TB24 Take 400 mg by mouth at bedtime.  5  . lidocaine-prilocaine (EMLA) cream Apply 1 application topically as needed. 30 g 2  . Melatonin 3 MG TABS Take 3 mg by mouth at bedtime as needed (for sleep.).    Marland Kitchen metroNIDAZOLE (FLAGYL) 500 MG tablet Take 1 tablet (500 mg total) by mouth 3 (three) times daily. 30 tablet 0  . Omega-3 Fatty Acids (FISH OIL PO) Take 2 capsules by mouth daily.    . prochlorperazine (COMPAZINE) 10 MG tablet Take 1 tablet (10 mg total) every 6 (six)  hours as needed by mouth (Nausea or vomiting). 40 tablet 3  . dexamethasone (DECADRON) 4 MG tablet Take 2 tablets (8 mg total) by mouth daily. Start the day after chemotherapy for 2 days. Take  with food. (Patient not taking: Reported on 01/17/2017) 30 tablet 1  . FLUZONE QUADRIVALENT 0.5 ML injection TO BE ADMINISTERED BY PHARMACIST FOR IMMUNIZATION  0  . ondansetron (ZOFRAN) 8 MG tablet Take 1 tablet (8 mg total) by mouth 2 (two) times daily as needed for refractory nausea / vomiting. Start on day 3 after chemotherapy. (Patient not taking: Reported on 01/17/2017) 30 tablet 1   No current facility-administered medications for this visit.     REVIEW OF SYSTEMS:   Constitutional: Denies fevers, chills or abnormal night sweats. (+) weight gain Eyes: Denies blurriness of vision, double vision or watery eyes Ears, nose, mouth, throat, and face: Denies mucositis or sore throat Respiratory: Denies cough, dyspnea or wheezes Cardiovascular: Denies palpitation, chest discomfort or lower extremity swelling Gastrointestinal:  Denies nausea, heartburn or change in bowel habits Skin: Denies abnormal skin rashes (+) skin dryness and peeling on hands Lymphatics: Denies new lymphadenopathy or easy bruising Neurological:Denies numbness, tingling or new weaknesses (+) cold sensitivity  Behavioral/Psych: Mood is stable, no new changes  All other systems were reviewed with the patient and are negative.  PHYSICAL EXAMINATION: ECOG PERFORMANCE STATUS: 0 Vitals:   02/28/17 1305  BP: 128/72  Pulse: 72  Resp: 16  Temp: 97.9 F (36.6 C)  SpO2: 100%  GENERAL:alert, no distress and comfortable SKIN: skin color, texture, turgor are normal, no rashes or significant lesions EYES: normal, conjunctiva are pink and non-injected, sclera clear OROPHARYNX:no exudate, no erythema and lips, buccal mucosa, and tongue normal  NECK: supple, thyroid normal size, non-tender, without nodularity LYMPH:  no palpable  lymphadenopathy in the cervical, axillary or inguinal LUNGS: clear to auscultation and percussion with normal breathing effort HEART: regular rate & rhythm and no murmurs and no lower extremity edema ABDOMEN:abdomen soft, non-tender and normal bowel sounds. Multiple small surgical incisions have healed well. No discharge or surrounding skin erythema. No organomegaly. (+) 0.5 cm nodule in lower abdominal incision, healed well, no other palpable mass.  Musculoskeletal:no cyanosis of digits and no clubbing  PSYCH: alert & oriented x 3 with fluent speech NEURO: no focal motor/sensory deficits  LABORATORY DATA:  I have reviewed the data as listed CBC Latest Ref Rng & Units 02/28/2017 02/08/2017 01/17/2017  WBC 4.0 - 10.3 10e3/uL 6.5 10.1 5.8  Hemoglobin 13.0 - 17.1 g/dL 12.1(L) 13.2 13.0  Hematocrit 38.4 - 49.9 % 34.3(L) 37.8(L) 36.7(L)  Platelets 140 - 400 10e3/uL 203 162 152   CMP Latest Ref Rng & Units 02/28/2017 02/08/2017 01/17/2017  Glucose 70 - 140 mg/dl 107 86 83  BUN 7.0 - 26.0 mg/dL 12.8 16.2 14.7  Creatinine 0.7 - 1.3 mg/dL 1.1 1.2 0.9  Sodium 136 - 145 mEq/L 138 139 140  Potassium 3.5 - 5.1 mEq/L 3.9 4.0 3.5  Chloride 101 - 111 mmol/L - - -  CO2 22 - 29 mEq/L _0 Calcium 8.4 - 10.4 mg/dL 8.6 9.2 9.1  Total Protein 6.4 - 8.3 g/dL 5.6(L) 6.7 6.9  Total Bilirubin 0.20 - 1.20 mg/dL 0.69 0.81 0.79  Alkaline Phos 40 - 150 U/L 90 96 83  AST 5 - 34 U/L 89(H) 45(H) 30  ALT 0 - 55 U/L 99(H) 81(H) 30    PATHOLOGY RESULTS:   Diagnosis 11/10/16 Colon, segmental resection for tumor, rectosigmoid ADENOCARCINOMA OF THE COLON, GRADE 3 (4.5 CM) THE TUMOR INVADES THROUGH THE MUSCULARIS PROPRIA INTO PERICOLONIC SOFT TISSUE (PT3) LYMPHOVASCULAR INVASION IS IDENTIFIED MARGINS OF RESECTION ARE NEGATIVE FOR CARCINOMA METASTATIC ADENOCARCINOMA IN FOUR OF  TWENTY-TWO LYMPH NODES (4/22, PN2A)  Microscopic Comment COLON AND RECTUM (INCLUDING TRANS-ANAL RESECTION): Specimen: Rectosigmoid  colon Procedure: Segmental resection Tumor site: descending colon Specimen integrity: Intact Macroscopic intactness of mesorectum: Not applicable: NA Complete: x Near complete: NA Incomplete: NA Cannot be determined (specify): NA Macroscopic tumor perforation: through the muscularis propria into the subserosal adipose tissue Invasive tumor: Maximum size: 4.5 cm Histologic type(s): Adenocarcinoma Histologic grade and differentiation: G1: well differentiated/low grade G2: moderately differentiated/low grade G3: poorly differentiated/high grade G4: undifferentiated/high grade Type of polyp in which invasive carcinoma arose: Tubular adenoma Microscopic extension of invasive tumor: The tumor invades through muscularis propria into the subserosal adiposetissue Lymph-Vascular invasion: Identified Peri-neural invasion: Negative Tumor deposit(s) (discontinuous extramural extension): negative Resection margins: Proximal margin: negative Distal margin: Negative Circumferential (radial) (posterior ascending, posterior descending; lateral and posterior mid-rectum; and entire lower 1/3 rectum):negative Mesenteric margin (sigmoid and transverse): Negative Distance closest margin (if all above margins negative): 0.1 cm to radial margin Trans-anal resection margins only: Deep margin: NA Mucosal Margin: NA Distance closest mucosal margin (if negative): NA Treatment effect (neo-adjuvant therapy): Negative Additional polyp(s): Negative Non-neoplastic findings: Unremarkable Lymph nodes: number examined 22; number positive: 4 Pathologic Staging: pT3, pN2a, pMx Ancillary studies: ordered  RADIOGRAPHIC STUDIES: I have personally reviewed the radiological images as listed and agreed with the findings in the report. No results found.  ASSESSMENT: 36 y.o.  Caucasian male with PMH of seizure, presented with recurrent rectal bleeding   1. Left colon cancer of the sigmoid colon, invasive  adenocarcinoma, GX,  pT3N2M0, stage IIIB, MSI stable -I previously reviewed his CT scan findings, and surgical pathology results in great details with patient and his wife. He had several questions which I answered in great detail.  -He had a complete surgical resection with negative margins, the tumor was T3 lesion and 4/22 nodes were positive.  -Staging scan was negative for distant metastasis. -Due to the high-risk of recurrence, I recommend adjuvant chemotherapy CAPOX for 6 months. We discussed the option of switched to FOLFOX if he has significant side effects from CAPOX. He contracted C.  -He has been recovering very well from his 11/10/16 surgery. He had his port placed on 12/01/16 -He started CAPOX on 12/06/16 tolerating moderately well so far.  -He has a 0.5 cm subcutaneous nodule in his lower abdominal incision. Will monitor.  -I discussed cancer surveillance again. He will continue with CEA testing every 3-6 months and next CT scan after chemo treatments are done. If he develops symptoms of recurrence we will scan sooner.  -02/28/2017 labs reviewed with patient and his wife, Hg 12.1, AST at 89, ALT at 99, Total bilirubin at 0.69.  Mild transaminitis is likely related to chemo.  Lab is adequate for treatment, will proceed to cycle 5 today. -F/u in 3 weeks for cycle 6  -Lab, flush and f/u in 3 weeks  -Plan for a total of 8 cycles if he tolerates well.  He is tolerating well overall, no significant neuropathy.  Cold sensitivity has been taking longer to recover, resolved now.    2. Weight loss and decreased appetite  -I previously encouraged him to eat more if he is able. We will refer him to our dietician service to assist him with this. We will consider supplements in the future to help with this following chemotherapy.  -He has gained some weight back lately. But after C. Diff episode his weight decreased.  -He would like to get back to his 180 lb weight. I  suggest he drinks ensure boosts  to help.  -If he has increased fatigue, decreased appetite or weight loss, or uncontrolled n/v, he can take 1 tablet dexa daily for 3-5 days -He has been gaining weight lately  3. History of seizure  -Continue medication and a follow-up with his neurologist at [facility].  4. 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  No recurrent diarrhea. -Patient on another 10 day round of Flagyl as of 02/23/2017 for probable C. Diff, this was not tested.   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   6.  Transaminitis -A second to chemotherapy, overall mild and stable, will monitor closely.  PLAN: -Lab reviewed, adequate for treatment, will start a cycle Ovid ox today  -We will continue Flagyl to complete a course this week, diarrhea has resolved  -lab, flush and f/u in 3 weeks     No orders of the defined types were placed in this encounter.    Truitt Merle, MD 02/28/2017   This document serves as a record of services personally performed by Truitt Merle, MD. It was created on her behalf by Steva Colder, a trained medical scribe. The creation of this record is based on the scribe's personal observations and the provider's statements to them.    I have reviewed the above documentation for accuracy and completeness, and I agree with the above.

## 2017-02-28 ENCOUNTER — Other Ambulatory Visit (HOSPITAL_BASED_OUTPATIENT_CLINIC_OR_DEPARTMENT_OTHER): Payer: No Typology Code available for payment source

## 2017-02-28 ENCOUNTER — Encounter: Payer: Self-pay | Admitting: Hematology

## 2017-02-28 ENCOUNTER — Ambulatory Visit: Payer: No Typology Code available for payment source

## 2017-02-28 ENCOUNTER — Ambulatory Visit (HOSPITAL_BASED_OUTPATIENT_CLINIC_OR_DEPARTMENT_OTHER): Payer: No Typology Code available for payment source | Admitting: Hematology

## 2017-02-28 ENCOUNTER — Telehealth: Payer: Self-pay | Admitting: Hematology

## 2017-02-28 ENCOUNTER — Ambulatory Visit (HOSPITAL_BASED_OUTPATIENT_CLINIC_OR_DEPARTMENT_OTHER): Payer: No Typology Code available for payment source

## 2017-02-28 VITALS — BP 128/72 | HR 72 | Temp 97.9°F | Resp 16 | Ht 72.0 in | Wt 177.7 lb

## 2017-02-28 DIAGNOSIS — R530 Neoplastic (malignant) related fatigue: Secondary | ICD-10-CM | POA: Diagnosis not present

## 2017-02-28 DIAGNOSIS — C187 Malignant neoplasm of sigmoid colon: Secondary | ICD-10-CM

## 2017-02-28 DIAGNOSIS — Z5111 Encounter for antineoplastic chemotherapy: Secondary | ICD-10-CM

## 2017-02-28 DIAGNOSIS — Z95828 Presence of other vascular implants and grafts: Secondary | ICD-10-CM

## 2017-02-28 DIAGNOSIS — R634 Abnormal weight loss: Secondary | ICD-10-CM

## 2017-02-28 DIAGNOSIS — R6889 Other general symptoms and signs: Secondary | ICD-10-CM | POA: Diagnosis not present

## 2017-02-28 DIAGNOSIS — R630 Anorexia: Secondary | ICD-10-CM

## 2017-02-28 LAB — COMPREHENSIVE METABOLIC PANEL
ALT: 99 U/L — ABNORMAL HIGH (ref 0–55)
ANION GAP: 7 meq/L (ref 3–11)
AST: 89 U/L — AB (ref 5–34)
Albumin: 3.3 g/dL — ABNORMAL LOW (ref 3.5–5.0)
Alkaline Phosphatase: 90 U/L (ref 40–150)
BUN: 12.8 mg/dL (ref 7.0–26.0)
CALCIUM: 8.6 mg/dL (ref 8.4–10.4)
CHLORIDE: 105 meq/L (ref 98–109)
CO2: 26 mEq/L (ref 22–29)
Creatinine: 1.1 mg/dL (ref 0.7–1.3)
Glucose: 107 mg/dl (ref 70–140)
POTASSIUM: 3.9 meq/L (ref 3.5–5.1)
Sodium: 138 mEq/L (ref 136–145)
Total Bilirubin: 0.69 mg/dL (ref 0.20–1.20)
Total Protein: 5.6 g/dL — ABNORMAL LOW (ref 6.4–8.3)

## 2017-02-28 LAB — CBC WITH DIFFERENTIAL/PLATELET
BASO%: 0.6 % (ref 0.0–2.0)
Basophils Absolute: 0 10*3/uL (ref 0.0–0.1)
EOS ABS: 0.1 10*3/uL (ref 0.0–0.5)
EOS%: 1.1 % (ref 0.0–7.0)
HCT: 34.3 % — ABNORMAL LOW (ref 38.4–49.9)
HEMOGLOBIN: 12.1 g/dL — AB (ref 13.0–17.1)
LYMPH#: 2 10*3/uL (ref 0.9–3.3)
LYMPH%: 30.1 % (ref 14.0–49.0)
MCH: 33.2 pg (ref 27.2–33.4)
MCHC: 35.2 g/dL (ref 32.0–36.0)
MCV: 94.2 fL (ref 79.3–98.0)
MONO#: 0.7 10*3/uL (ref 0.1–0.9)
MONO%: 10.2 % (ref 0.0–14.0)
NEUT%: 58 % (ref 39.0–75.0)
NEUTROS ABS: 3.8 10*3/uL (ref 1.5–6.5)
PLATELETS: 203 10*3/uL (ref 140–400)
RBC: 3.65 10*6/uL — ABNORMAL LOW (ref 4.20–5.82)
RDW: 21.4 % — AB (ref 11.0–14.6)
WBC: 6.5 10*3/uL (ref 4.0–10.3)

## 2017-02-28 MED ORDER — SODIUM CHLORIDE 0.9% FLUSH
10.0000 mL | INTRAVENOUS | Status: DC | PRN
  Administered 2017-02-28: 10 mL
  Filled 2017-02-28: qty 10

## 2017-02-28 MED ORDER — DEXTROSE 5 % IV SOLN
Freq: Once | INTRAVENOUS | Status: AC
  Administered 2017-02-28: 15:00:00 via INTRAVENOUS

## 2017-02-28 MED ORDER — DEXAMETHASONE SODIUM PHOSPHATE 10 MG/ML IJ SOLN
INTRAMUSCULAR | Status: AC
Start: 2017-02-28 — End: ?
  Filled 2017-02-28: qty 1

## 2017-02-28 MED ORDER — SODIUM CHLORIDE 0.9% FLUSH
10.0000 mL | Freq: Once | INTRAVENOUS | Status: AC
  Administered 2017-02-28: 10 mL
  Filled 2017-02-28: qty 10

## 2017-02-28 MED ORDER — OXALIPLATIN CHEMO INJECTION 100 MG/20ML
125.0000 mg/m2 | Freq: Once | INTRAVENOUS | Status: AC
  Administered 2017-02-28: 250 mg via INTRAVENOUS
  Filled 2017-02-28: qty 40

## 2017-02-28 MED ORDER — PALONOSETRON HCL INJECTION 0.25 MG/5ML
0.2500 mg | Freq: Once | INTRAVENOUS | Status: AC
  Administered 2017-02-28: 0.25 mg via INTRAVENOUS

## 2017-02-28 MED ORDER — DEXAMETHASONE SODIUM PHOSPHATE 10 MG/ML IJ SOLN
10.0000 mg | Freq: Once | INTRAMUSCULAR | Status: AC
  Administered 2017-02-28: 10 mg via INTRAVENOUS

## 2017-02-28 MED ORDER — HEPARIN SOD (PORK) LOCK FLUSH 100 UNIT/ML IV SOLN
500.0000 [IU] | Freq: Once | INTRAVENOUS | Status: AC | PRN
  Administered 2017-02-28: 500 [IU]
  Filled 2017-02-28: qty 5

## 2017-02-28 MED ORDER — PALONOSETRON HCL INJECTION 0.25 MG/5ML
INTRAVENOUS | Status: AC
  Filled 2017-02-28: qty 5

## 2017-02-28 NOTE — Progress Notes (Signed)
Pt saw Dr. Feng today prior to chemo.  OK to treat with all lab results today as per Dr. Feng. 

## 2017-02-28 NOTE — Patient Instructions (Signed)
Implanted Port Home Guide An implanted port is a type of central line that is placed under the skin. Central lines are used to provide IV access when treatment or nutrition needs to be given through a person's veins. Implanted ports are used for long-term IV access. An implanted port may be placed because:  You need IV medicine that would be irritating to the small veins in your hands or arms.  You need long-term IV medicines, such as antibiotics.  You need IV nutrition for a long period.  You need frequent blood draws for lab tests.  You need dialysis.  Implanted ports are usually placed in the chest area, but they can also be placed in the upper arm, the abdomen, or the leg. An implanted port has two main parts:  Reservoir. The reservoir is round and will appear as a small, raised area under your skin. The reservoir is the part where a needle is inserted to give medicines or draw blood.  Catheter. The catheter is a thin, flexible tube that extends from the reservoir. The catheter is placed into a large vein. Medicine that is inserted into the reservoir goes into the catheter and then into the vein.  How will I care for my incision site? Do not get the incision site wet. Bathe or shower as directed by your health care provider. How is my port accessed? Special steps must be taken to access the port:  Before the port is accessed, a numbing cream can be placed on the skin. This helps numb the skin over the port site.  Your health care provider uses a sterile technique to access the port. ? Your health care provider must put on a mask and sterile gloves. ? The skin over your port is cleaned carefully with an antiseptic and allowed to dry. ? The port is gently pinched between sterile gloves, and a needle is inserted into the port.  Only "non-coring" port needles should be used to access the port. Once the port is accessed, a blood return should be checked. This helps ensure that the port  is in the vein and is not clogged.  If your port needs to remain accessed for a constant infusion, a clear (transparent) bandage will be placed over the needle site. The bandage and needle will need to be changed every week, or as directed by your health care provider.  Keep the bandage covering the needle clean and dry. Do not get it wet. Follow your health care provider's instructions on how to take a shower or bath while the port is accessed.  If your port does not need to stay accessed, no bandage is needed over the port.  What is flushing? Flushing helps keep the port from getting clogged. Follow your health care provider's instructions on how and when to flush the port. Ports are usually flushed with saline solution or a medicine called heparin. The need for flushing will depend on how the port is used.  If the port is used for intermittent medicines or blood draws, the port will need to be flushed: ? After medicines have been given. ? After blood has been drawn. ? As part of routine maintenance.  If a constant infusion is running, the port may not need to be flushed.  How long will my port stay implanted? The port can stay in for as long as your health care provider thinks it is needed. When it is time for the port to come out, surgery will be   done to remove it. The procedure is similar to the one performed when the port was put in. When should I seek immediate medical care? When you have an implanted port, you should seek immediate medical care if:  You notice a bad smell coming from the incision site.  You have swelling, redness, or drainage at the incision site.  You have more swelling or pain at the port site or the surrounding area.  You have a fever that is not controlled with medicine.  This information is not intended to replace advice given to you by your health care provider. Make sure you discuss any questions you have with your health care provider. Document  Released: 02/15/2005 Document Revised: 07/24/2015 Document Reviewed: 10/23/2012 Elsevier Interactive Patient Education  2017 [employer]  

## 2017-02-28 NOTE — Patient Instructions (Signed)
[facility] Discharge Instructions for Patients Receiving Chemotherapy  Today you received the following chemotherapy agents:  Oxaliplatin  To help prevent nausea and vomiting after your treatment, we encourage you to take your nausea medication as prescribed.   If you develop nausea and vomiting that is not controlled by your nausea medication, call the clinic.   BELOW ARE SYMPTOMS THAT SHOULD BE REPORTED IMMEDIATELY:  *FEVER GREATER THAN 100.5 F  *CHILLS WITH OR WITHOUT FEVER  NAUSEA AND VOMITING THAT IS NOT CONTROLLED WITH YOUR NAUSEA MEDICATION  *UNUSUAL SHORTNESS OF BREATH  *UNUSUAL BRUISING OR BLEEDING  TENDERNESS IN MOUTH AND THROAT WITH OR WITHOUT PRESENCE OF ULCERS  *URINARY PROBLEMS  *BOWEL PROBLEMS  UNUSUAL RASH Items with * indicate a potential emergency and should be followed up as soon as possible.  Feel free to call the clinic should you have any questions or concerns. The clinic phone number is [phone].  Please show the CHEMO ALERT CARD at check-in to the Emergency Department and triage nurse.   

## 2017-02-28 NOTE — Telephone Encounter (Signed)
Gave patient avs report and appointments for January  °

## 2017-03-01 ENCOUNTER — Encounter: Payer: Self-pay | Admitting: Hematology

## 2017-03-11 MED FILL — CAPECITABINE 500 MG TABLET: 500 | 21 days supply | Qty: 112 | Fill #1

## 2017-03-18 NOTE — Progress Notes (Addendum)
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 Melina Copa: There is an obviously malignant mass at 28 cm in the sigmoid colon. He was circumferential and the colonoscopy could not be passed through the lesions. Multiple biopsies taken.      10/01/2016 Initial Biopsy    Sigmoid colon mass biopsy showed invasive adenocarcinoma.      10/04/2016 Imaging    CT abdomen/pelvis - no signs of metastatic disease, several enlarged mesenteric lymph nodes      10/13/2016 Imaging    CT chest, abdomen and the pelvis with contrast and showed no evidence of distant recurrence.       10/21/2016 Genetic Testing    The patient had genetic testing due to a personal history of colon cancer at 89.  He was tested for the Common Hereditary Cancer Panel.  The Hereditary Gene Panel offered by [facility] includes sequencing and/or deletion duplication testing of the following 46 genes: APC, ATM, AXIN2, BARD1, BMPR1A, BRCA1, BRCA2, BRIP1, CDH1, CDKN2A (p14ARF), CDKN2A (p16INK4a), CHEK2, CTNNA1, DICER1, EPCAM (Deletion/duplication testing only), GREM1 (promoter region deletion/duplication testing only), KIT, MEN1, MLH1, MSH2, MSH3, MSH6, MUTYH, NBN, NF1, NHTL1, PALB2, PDGFRA, PMS2, POLD1, POLE, PTEN, RAD50,  RAD51C, RAD51D, SDHB, SDHC, SDHD, SMAD4, SMARCA4. STK11, TP53, TSC1, TSC2, and VHL.  The following genes were evaluated for sequence changes only: SDHA and HOXB13 c.251G>A variant only.  Results: No pathogenic mutations identified.  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  The date of this test report is 10/21/2016.       11/10/2016 Pathology Results    Diagnosis- surgical pathology Colon, segmental resection for tumor, rectosigmoid ADENOCARCINOMA OF THE COLON, GRADE 3 (4.5 CM) THE TUMOR INVADES THROUGH THE MUSCULARIS PROPRIA INTO PERICOLONIC SOFT TISSUE (PT3) LYMPHOVASCULAR INVASION IS IDENTIFIED MARGINS OF RESECTION ARE NEGATIVE FOR CARCINOMA METASTATIC ADENOCARCINOMA IN FOUR OF TWENTY-TWO LYMPH NODES (4/22, PN2A)      11/10/2016 Surgery     XI ROBOT ASSISTED SIGMOIDECTOMY  SURGEON:  Surgeon(s): Amin Boston, MD Leighton Ruff, MD       6/54/6503 Miscellaneous    Colon cancer MSI-stable       12/06/2016 -  Chemotherapy    CAPOX every 3 weeks for 6 months starting 12/06/16        HISTORY OF PRESENTING ILLNESS:  Andres Wallace 37 y.o. male is here because of colorectal cancer. He presents today with his wife, [name]. He was referred by his surgeon Dr. Marcello Moores for his stage III colon cancer.   Pt reports that 12-14 years ago he was diagnosed with stomach ulcers. He also had a confirmation scope later which confirmed this.  Initially, he presented to his gastroenterologist (Dr Nehemiah Wallace) complaining of a 1-2 month history of rectal  bleeding which he describes as maroon colored blood. He does note that over the Spring or early Summer that he experienced some bright-red blood as well. He also had developed some cramping abdominal pain throughout this time as well with his maroon colored stools. He was on an antibiotic at that time for a non-cancerous mole removal which he thought was the source of his pain. His pain resolved after  these antibiotics, but he reports that it later returned several weeks ago. He had a DRE performed which did confirm the presence of blood and a colonoscopy was subsequently also performed at that time which revealed mass of the sigmoid or descending colon. He had biopsy performed of this which showed invasive adenocarcinoma. His CEA level around this time was 1.7. On 10/04/16 he had a CT A/P performed which showed no signs of metastatic disease, but it did show several mesenteric lymph nodes which were enlarged. The next day on 10/05/16 he had follow-up with Dr Marcello Moores of [facility] who recommended a left hemicolectomy in a minimally invasive fashion. He was also referred to genetics and scheduled to have a CT Chest performed to complete his metastatic workup. On 11/10/16 he underwent left hemicolectomy which was performed without complication. He recovered from this well during his admission and was subsequently discharge on 11/12/16. His genetic screening was performed and was normal. He does not have a FHx of colon cancer. 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 He has one son and two daughters who are all young. His wife currently is out of work to take care of their children and the home, she previously worked as a Marine scientist for [employer].   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   [name] presents today for consult. Overall, he is doing well. He has been recovering from his surgery well. He notes that if he twists his abdomen too quickly  he will have some pain surrounding his incisional site, but otherwise this has not really bothered him. He also notes that he has lost approximately 10-12lbs due to some appetite issues. His appetite has been returning slowly, however.  His energy  levels have been good overall. He denies nausea, vomiting, changes in bowel habits, or any other associated symptoms.    CURRENT THERAPY: CAPOX every 3 weeks for 6 months starting 12/06/16    INTERVAL HISTORY:   Andres Wallace is here for a follow up and cycle 6 CAPOX. He presents today accompanied by his wife. He reports that he is doing okay overall and that cycle 5 was his best cycle. He still had cold sensitivity following treatment for 8 days. He has dry skin on his hands. He endorses a good appetite.   On review of systems, pt denies numbness/tingling, or any other complaints at this time. Pt denies diarrhea, abdominal pain, nausea, vomiting. Pertinent positives are listed and detailed within the above HPI.     MEDICAL HISTORY:  Past Medical History:  Diagnosis Date  . Colon cancer ([place])   . Family history of lung cancer   . Right inguinal hernia   . Seizures ([place])    3 seizures 2016    SURGICAL HISTORY: Past Surgical History:  Procedure Laterality Date  . APPENDECTOMY  2006  .  COLON SURGERY     for colon cancer  . HERNIA REPAIR    . INGUINAL HERNIA REPAIR  10/15/2011   Procedure: HERNIA REPAIR INGUINAL ADULT;  Surgeon: Odis Hollingshead, MD;  Location: [facility];  Service: General;  Laterality: Right;  with mesh   . left knee arthroscopy     1995  . PORTACATH PLACEMENT Right 12/01/2016   Procedure: INSERTION PORT-A-CATH;  Surgeon: Leighton Ruff, MD;  Location: [facility];  Service: General;  Laterality: Right;  . PROCTOSCOPY  11/10/2016   Procedure: PROCTOSCOPY;  Surgeon: Leighton Ruff, MD;  Location: [facility];  Service: General;;  . VASECTOMY  10/15/2011   Procedure: VASECTOMY;  Surgeon: Claybon Jabs, MD;  Location: [facility];  Service: Urology;  Laterality: N/A;    SOCIAL HISTORY: Social History   Socioeconomic History  . Marital status: Married    Spouse name: Not on file  . Number of children: Not on file  . Years of education: Not on file  . Highest education level:  Not on file  Social Needs  . Financial resource strain: Not on file  . Food insecurity - worry: Not on file  . Food insecurity - inability: Not on file  . Transportation needs - medical: Not on file  . Transportation needs - non-medical: Not on file  Occupational History  . Not on file  Tobacco Use  . Smoking status: Never Smoker  . Smokeless tobacco: Never Used  Substance and Sexual Activity  . Alcohol use: Yes    Comment: rarely  . Drug use: No  . Sexual activity: Yes  Other Topics Concern  . Not on file  Social History Narrative  . Not on file    FAMILY HISTORY: Family History  Problem Relation Age of Onset  . Lung cancer Father 27  . Lung cancer Paternal Grandfather        dx in 30's, died at 61  . Heart attack Maternal Grandmother 58    ALLERGIES:  has No Known Allergies.  MEDICATIONS:  Current Outpatient Medications  Medication Sig Dispense Refill  . FLUZONE QUADRIVALENT 0.5 ML injection TO BE ADMINISTERED BY PHARMACIST FOR IMMUNIZATION  0  . LamoTRIgine XR 200 MG TB24 Take 400 mg by mouth at bedtime.  5  . lidocaine-prilocaine (EMLA) cream Apply 1 application topically as needed. 30 g 2  . Melatonin 3 MG TABS Take 3 mg by mouth at bedtime as needed (for sleep.).    Marland Kitchen Omega-3 Fatty Acids (FISH OIL PO) Take 2 capsules by mouth daily.    . prochlorperazine (COMPAZINE) 10 MG tablet Take 1 tablet (10 mg total) by mouth every 6 (six) hours as needed (Nausea or vomiting). 40 tablet 1  . capecitabine (XELODA) 500 MG tablet Take 4 tablets (2,000 mg total) by mouth 2 (two) times daily after a meal. Take on days 1-14 of chemotherapy. (Patient not taking: Reported on 03/21/2017) 112 tablet 1  . dexamethasone (DECADRON) 4 MG tablet Take 2 tablets (8 mg total) by mouth daily. Start the day after chemotherapy for 2 days. Take with food. (Patient not taking: Reported on 01/17/2017) 30 tablet 1  . diphenoxylate-atropine (LOMOTIL) 2.5-0.025 MG tablet Take 2 tablets by mouth every 6  (six) hours as needed.  0  . ondansetron (ZOFRAN) 8 MG tablet Take 1 tablet (8 mg total) by mouth 2 (two) times daily as needed for refractory nausea / vomiting. Start on day 3 after chemotherapy. (Patient not taking: Reported on 01/17/2017) 30 tablet  1   No current facility-administered medications for this visit.     REVIEW OF SYSTEMS:   Constitutional: Denies fevers, chills or abnormal night sweats. (+) weight gain (+) good appetite Eyes: Denies blurriness of vision, double vision or watery eyes Ears, nose, mouth, throat, and face: Denies mucositis or sore throat Respiratory: Denies cough, dyspnea or wheezes Cardiovascular: Denies palpitation, chest discomfort or lower extremity swelling Gastrointestinal:  Denies nausea, heartburn or change in bowel habits Skin: Denies abnormal skin rashes (+) skin dryness and peeling on hands Lymphatics: Denies new lymphadenopathy or easy bruising Neurological:Denies numbness, tingling or new weaknesses (+) cold sensitivity  Behavioral/Psych: Mood is stable, no new changes  All other systems were reviewed with the patient and are negative.  PHYSICAL EXAMINATION: ECOG PERFORMANCE STATUS: 0 Vitals:   03/21/17 1110  BP: 128/70  Pulse: 71  Resp: 18  Temp: 98.1 F (36.7 C)  SpO2: 100%  GENERAL:alert, no distress and comfortable SKIN: skin color, texture, turgor are normal, no rashes or significant lesions EYES: normal, conjunctiva are pink and non-injected, sclera clear OROPHARYNX:no exudate, no erythema and lips, buccal mucosa, and tongue normal  NECK: supple, thyroid normal size, non-tender, without nodularity LYMPH:  no palpable lymphadenopathy in the cervical, axillary or inguinal LUNGS: clear to auscultation and percussion with normal breathing effort HEART: regular rate & rhythm and no murmurs and no lower extremity edema ABDOMEN:abdomen soft, non-tender and normal bowel sounds. Multiple small surgical incisions have healed well. No  discharge or surrounding skin erythema. No organomegaly. (+) 0.5 cm nodule in lower abdominal incision, healed well, no other palpable mass.  Musculoskeletal:no cyanosis of digits and no clubbing  PSYCH: alert & oriented x 3 with fluent speech NEURO: no focal motor/sensory deficits  LABORATORY DATA:  I have reviewed the data as listed CBC Latest Ref Rng & Units 03/21/2017 02/28/2017 02/08/2017  WBC 4.0 - 10.3 K/uL 5.2 6.5 10.1  Hemoglobin 13.0 - 17.1 g/dL 11.8(L) 12.1(L) 13.2  Hematocrit 38.4 - 49.9 % 33.8(L) 34.3(L) 37.8(L)  Platelets 140 - 400 K/uL 129(L) 203 162   CMP Latest Ref Rng & Units 03/21/2017 02/28/2017 02/08/2017  Glucose 70 - 140 mg/dL 92 107 86  BUN 7 - 26 mg/dL 12 12.8 16.2  Creatinine 0.70 - 1.30 mg/dL 0.87 1.1 1.2  Sodium 136 - 145 mmol/L 139 138 139  Potassium 3.5 - 5.1 mmol/L 3.4(L) 3.9 4.0  Chloride 98 - 109 mmol/L 106 - -  CO2 22 - 29 mmol/L 25 26 24   Calcium 8.4 - 10.4 mg/dL 9.2 8.6 9.2  Total Protein 6.4 - 8.3 g/dL 6.3(L) 5.6(L) 6.7  Total Bilirubin 0.2 - 1.2 mg/dL 1.2 0.69 0.81  Alkaline Phos 40 - 150 U/L 88 90 96  AST 5 - 34 U/L 48(H) 89(H) 45(H)  ALT 0 - 55 U/L 43 99(H) 81(H)    PATHOLOGY RESULTS:   Diagnosis 11/10/16 Colon, segmental resection for tumor, rectosigmoid ADENOCARCINOMA OF THE COLON, GRADE 3 (4.5 CM) THE TUMOR INVADES THROUGH THE MUSCULARIS PROPRIA INTO PERICOLONIC SOFT TISSUE (PT3) LYMPHOVASCULAR INVASION IS IDENTIFIED MARGINS OF RESECTION ARE NEGATIVE FOR CARCINOMA METASTATIC ADENOCARCINOMA IN FOUR OF TWENTY-TWO LYMPH NODES (4/22, PN2A)  Microscopic Comment COLON AND RECTUM (INCLUDING TRANS-ANAL RESECTION): Specimen: Rectosigmoid colon Procedure: Segmental resection Tumor site: descending colon Specimen integrity: Intact Macroscopic intactness of mesorectum: Not applicable: NA Complete: x Near complete: NA Incomplete: NA Cannot be determined (specify): NA Macroscopic tumor perforation: through the muscularis propria into the  subserosal adipose tissue Invasive tumor: Maximum size: 4.5  cm Histologic type(s): Adenocarcinoma Histologic grade and differentiation: G1: well differentiated/low grade G2: moderately differentiated/low grade G3: poorly differentiated/high grade G4: undifferentiated/high grade Type of polyp in which invasive carcinoma arose: Tubular adenoma Microscopic extension of invasive tumor: The tumor invades through muscularis propria into the subserosal adiposetissue Lymph-Vascular invasion: Identified Peri-neural invasion: Negative Tumor deposit(s) (discontinuous extramural extension): negative Resection margins: Proximal margin: negative Distal margin: Negative Circumferential (radial) (posterior ascending, posterior descending; lateral and posterior mid-rectum; and entire lower 1/3 rectum):negative Mesenteric margin (sigmoid and transverse): Negative Distance closest margin (if all above margins negative): 0.1 cm to radial margin Trans-anal resection margins only: Deep margin: NA Mucosal Margin: NA Distance closest mucosal margin (if negative): NA Treatment effect (neo-adjuvant therapy): Negative Additional polyp(s): Negative Non-neoplastic findings: Unremarkable Lymph nodes: number examined 22; number positive: 4 Pathologic Staging: pT3, pN2a, pMx Ancillary studies: ordered  RADIOGRAPHIC STUDIES: I have personally reviewed the radiological images as listed and agreed with the findings in the report. No results found.  ASSESSMENT: 37 y.o.  Caucasian male with PMH of seizure, presented with recurrent rectal bleeding   1. Left colon cancer of the sigmoid colon, invasive adenocarcinoma, GX,  pT3N2M0, stage IIIB, MSI stable -I previously reviewed his CT scan findings, and surgical pathology results in great details with patient and his wife. He had several questions which I answered in great detail.  -He had a complete surgical resection with negative margins, the tumor was T3 lesion  and 4/22 nodes were positive.  -Staging scan was negative for distant metastasis. -Due to the high-risk of recurrence, I recommend adjuvant chemotherapy CAPOX for 6 months. We discussed the option of switched to FOLFOX if he has significant side effects from CAPOX. He contracted C.  -He has been recovering very well from his 11/10/16 surgery. He had his port placed on 12/01/16 -He started CAPOX on 12/06/16 tolerating moderately well so far.  -He has a 0.5 cm subcutaneous nodule in his lower abdominal incision. Will monitor.  -I discussed cancer surveillance again. He will continue with CEA testing every 3-6 months and next CT scan after chemo treatments are done. If he develops symptoms of recurrence we will scan sooner.  -Lab, flush and f/u in 3 weeks  -Plan for a total of 8 cycles if he tolerates well.  He is tolerating well overall, no significant neuropathy.  Cold sensitivity has been taking longer to recover, resolved now.  -Labs today (03/21/17) are adequate for treatment, will proceed to cycle 6 today. Hgb is 11.8 and his platelets are 129 K.  -We will reduce dose of oxaliplatin slightly to 110 mg/m2, due to his mild cytopenia.  He is tolerating treatment well, I have low threshold to decrease or hold oxaliplatin for the next 2 cycles if his cytopenia gets worse or if he develops neuropathy, due to the small benefit -F/u in 3 weeks for cycle 7 of CAPOX, plan for a total of 8 cycles -Plan to get a surveillance CT scan after he completes adjuvant chemotherapy.  2. Weight loss and decreased appetite  -I previously encouraged him to eat more if he is able. We will refer him to our dietician service to assist him with this. We will consider supplements in the future to help with this following chemotherapy.  -He has gained some weight back lately. But after C. Diff episode his weight decreased.  -He would like to get back to his 180 lb weight. I suggest he drinks ensure boosts to help.  -If he has  increased fatigue, decreased appetite or weight loss, or uncontrolled n/v, he can take 1 tablet dexa daily for 3-5 days -He has been gaining weight lately  3. History of seizure  -Continue medication and a follow-up with his neurologist [facility].  4. 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  No recurrent diarrhea. -Patient on another 10 day round of Flagyl as of 02/23/2017 for probable C. Diff, this was not tested.   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   6.  Transaminitis -A second to chemotherapy, overall mild and stable, will monitor closely.  PLAN: -Lab reviewed, adequate for treatment, will start a cycle 6 CAPOX today  -We will reduce dose of oxaliplatin to 110 mg/m2 due to cytopenia  -lab, flush, f/u and chemo and f/u in 3 and 6 weeks -refilled Compazine today    No orders of the defined types were placed in this encounter.    Truitt Merle, MD 03/21/2017   This document serves as a record of services personally performed by Truitt Merle, MD. It was created on her behalf by Theresia Bough, a trained medical scribe. The creation of this record is based on the scribe's personal observations and the provider's statements to them.   I have reviewed the above documentation for accuracy and completeness, and I agree with the above.

## 2017-03-21 ENCOUNTER — Inpatient Hospital Stay: Payer: No Typology Code available for payment source

## 2017-03-21 ENCOUNTER — Inpatient Hospital Stay: Payer: No Typology Code available for payment source | Attending: Hematology | Admitting: Hematology

## 2017-03-21 ENCOUNTER — Telehealth: Payer: Self-pay | Admitting: Hematology

## 2017-03-21 VITALS — BP 128/70 | HR 71 | Temp 98.1°F | Resp 18 | Ht 72.0 in | Wt 178.1 lb

## 2017-03-21 DIAGNOSIS — K4090 Unilateral inguinal hernia, without obstruction or gangrene, not specified as recurrent: Secondary | ICD-10-CM | POA: Diagnosis not present

## 2017-03-21 DIAGNOSIS — Z79899 Other long term (current) drug therapy: Secondary | ICD-10-CM | POA: Diagnosis not present

## 2017-03-21 DIAGNOSIS — Z9049 Acquired absence of other specified parts of digestive tract: Secondary | ICD-10-CM | POA: Diagnosis not present

## 2017-03-21 DIAGNOSIS — D759 Disease of blood and blood-forming organs, unspecified: Secondary | ICD-10-CM | POA: Diagnosis not present

## 2017-03-21 DIAGNOSIS — G40909 Epilepsy, unspecified, not intractable, without status epilepticus: Secondary | ICD-10-CM | POA: Diagnosis not present

## 2017-03-21 DIAGNOSIS — R634 Abnormal weight loss: Secondary | ICD-10-CM | POA: Insufficient documentation

## 2017-03-21 DIAGNOSIS — Z5111 Encounter for antineoplastic chemotherapy: Secondary | ICD-10-CM | POA: Diagnosis not present

## 2017-03-21 DIAGNOSIS — C187 Malignant neoplasm of sigmoid colon: Secondary | ICD-10-CM

## 2017-03-21 DIAGNOSIS — R740 Nonspecific elevation of levels of transaminase and lactic acid dehydrogenase [LDH]: Secondary | ICD-10-CM | POA: Insufficient documentation

## 2017-03-21 DIAGNOSIS — R630 Anorexia: Secondary | ICD-10-CM | POA: Diagnosis not present

## 2017-03-21 DIAGNOSIS — Z8711 Personal history of peptic ulcer disease: Secondary | ICD-10-CM | POA: Diagnosis not present

## 2017-03-21 DIAGNOSIS — Z95828 Presence of other vascular implants and grafts: Secondary | ICD-10-CM

## 2017-03-21 DIAGNOSIS — Z801 Family history of malignant neoplasm of trachea, bronchus and lung: Secondary | ICD-10-CM

## 2017-03-21 LAB — COMPREHENSIVE METABOLIC PANEL
ALK PHOS: 88 U/L (ref 40–150)
ALT: 43 U/L (ref 0–55)
ANION GAP: 8 (ref 3–11)
AST: 48 U/L — ABNORMAL HIGH (ref 5–34)
Albumin: 3.9 g/dL (ref 3.5–5.0)
BUN: 12 mg/dL (ref 7–26)
CALCIUM: 9.2 mg/dL (ref 8.4–10.4)
CO2: 25 mmol/L (ref 22–29)
CREATININE: 0.87 mg/dL (ref 0.70–1.30)
Chloride: 106 mmol/L (ref 98–109)
Glucose, Bld: 92 mg/dL (ref 70–140)
Potassium: 3.4 mmol/L — ABNORMAL LOW (ref 3.5–5.1)
Sodium: 139 mmol/L (ref 136–145)
Total Bilirubin: 1.2 mg/dL (ref 0.2–1.2)
Total Protein: 6.3 g/dL — ABNORMAL LOW (ref 6.4–8.3)

## 2017-03-21 LAB — CBC WITH DIFFERENTIAL/PLATELET
BASOS ABS: 0 10*3/uL (ref 0.0–0.1)
Basophils Relative: 1 %
EOS ABS: 0.1 10*3/uL (ref 0.0–0.5)
Eosinophils Relative: 2 %
HCT: 33.8 % — ABNORMAL LOW (ref 38.4–49.9)
HEMOGLOBIN: 11.8 g/dL — AB (ref 13.0–17.1)
Lymphocytes Relative: 37 %
Lymphs Abs: 2 10*3/uL (ref 0.9–3.3)
MCH: 34.4 pg — ABNORMAL HIGH (ref 27.2–33.4)
MCHC: 34.8 g/dL (ref 32.0–36.0)
MCV: 98.9 fL — ABNORMAL HIGH (ref 79.3–98.0)
Monocytes Absolute: 0.7 10*3/uL (ref 0.1–0.9)
Monocytes Relative: 13 %
NEUTROS PCT: 47 %
Neutro Abs: 2.5 10*3/uL (ref 1.5–6.5)
Platelets: 129 10*3/uL — ABNORMAL LOW (ref 140–400)
RBC: 3.42 MIL/uL — AB (ref 4.20–5.82)
RDW: 23.2 % — ABNORMAL HIGH (ref 11.0–15.6)
WBC: 5.2 10*3/uL (ref 4.0–10.3)

## 2017-03-21 MED ORDER — HEPARIN SOD (PORK) LOCK FLUSH 100 UNIT/ML IV SOLN
500.0000 [IU] | Freq: Once | INTRAVENOUS | Status: AC | PRN
  Administered 2017-03-21: 500 [IU]
  Filled 2017-03-21: qty 5

## 2017-03-21 MED ORDER — PROCHLORPERAZINE MALEATE 10 MG PO TABS: 10.0000 mg | ORAL_TABLET | Freq: Four times a day (QID) | ORAL | 1 refills | Status: DC | PRN

## 2017-03-21 MED ORDER — OXALIPLATIN CHEMO INJECTION 100 MG/20ML
110.0000 mg/m2 | Freq: Once | INTRAVENOUS | Status: AC
  Administered 2017-03-21: 220 mg via INTRAVENOUS
  Filled 2017-03-21: qty 40

## 2017-03-21 MED ORDER — DEXAMETHASONE SODIUM PHOSPHATE 10 MG/ML IJ SOLN
10.0000 mg | Freq: Once | INTRAMUSCULAR | Status: AC
  Administered 2017-03-21: 10 mg via INTRAVENOUS

## 2017-03-21 MED ORDER — PALONOSETRON HCL INJECTION 0.25 MG/5ML
INTRAVENOUS | Status: AC
  Filled 2017-03-21: qty 5

## 2017-03-21 MED ORDER — DEXTROSE 5 % IV SOLN
Freq: Once | INTRAVENOUS | Status: AC
  Administered 2017-03-21: 13:00:00 via INTRAVENOUS

## 2017-03-21 MED ORDER — SODIUM CHLORIDE 0.9% FLUSH
10.0000 mL | INTRAVENOUS | Status: DC | PRN
  Administered 2017-03-21: 10 mL
  Filled 2017-03-21: qty 10

## 2017-03-21 MED ORDER — PALONOSETRON HCL INJECTION 0.25 MG/5ML
0.2500 mg | Freq: Once | INTRAVENOUS | Status: AC
  Administered 2017-03-21: 0.25 mg via INTRAVENOUS

## 2017-03-21 MED ORDER — SODIUM CHLORIDE 0.9% FLUSH
10.0000 mL | Freq: Once | INTRAVENOUS | Status: AC
  Administered 2017-03-21: 10 mL
  Filled 2017-03-21: qty 10

## 2017-03-21 MED ORDER — DEXAMETHASONE SODIUM PHOSPHATE 10 MG/ML IJ SOLN
INTRAMUSCULAR | Status: AC
  Filled 2017-03-21: qty 1

## 2017-03-21 NOTE — Telephone Encounter (Signed)
Gave avs and calendar for February and march °

## 2017-03-21 NOTE — Patient Instructions (Signed)
[place] Discharge Instructions for Patients Receiving Chemotherapy  Today you received the following chemotherapy agents :  Oxaliplatin.  To help prevent nausea and vomiting after your treatment, we encourage you to take your nausea medication as prescribed. BUT NO ZOFRAN FOR 3 DAYS.    If you develop nausea and vomiting that is not controlled by your nausea medication, call the clinic.   BELOW ARE SYMPTOMS THAT SHOULD BE REPORTED IMMEDIATELY:  *FEVER GREATER THAN 100.5 F  *CHILLS WITH OR WITHOUT FEVER  NAUSEA AND VOMITING THAT IS NOT CONTROLLED WITH YOUR NAUSEA MEDICATION  *UNUSUAL SHORTNESS OF BREATH  *UNUSUAL BRUISING OR BLEEDING  TENDERNESS IN MOUTH AND THROAT WITH OR WITHOUT PRESENCE OF ULCERS  *URINARY PROBLEMS  *BOWEL PROBLEMS  UNUSUAL RASH Items with * indicate a potential emergency and should be followed up as soon as possible.  Feel free to call the clinic should you have any questions or concerns. The clinic phone number is [phone].  Please show the [facility] at check-in to the Emergency Department and triage nurse.

## 2017-03-22 ENCOUNTER — Encounter: Payer: Self-pay | Admitting: Hematology

## 2017-03-28 ENCOUNTER — Other Ambulatory Visit: Payer: Self-pay | Admitting: Hematology

## 2017-03-28 DIAGNOSIS — C187 Malignant neoplasm of sigmoid colon: Secondary | ICD-10-CM

## 2017-04-06 MED FILL — CAPECITABINE 500 MG TABLET: 500 | 21 days supply | Qty: 112 | Fill #0

## 2017-04-11 ENCOUNTER — Inpatient Hospital Stay: Payer: No Typology Code available for payment source

## 2017-04-11 ENCOUNTER — Encounter: Payer: Self-pay | Admitting: Nurse Practitioner

## 2017-04-11 ENCOUNTER — Inpatient Hospital Stay: Payer: No Typology Code available for payment source | Attending: Hematology

## 2017-04-11 ENCOUNTER — Inpatient Hospital Stay (HOSPITAL_BASED_OUTPATIENT_CLINIC_OR_DEPARTMENT_OTHER): Payer: No Typology Code available for payment source | Admitting: Nurse Practitioner

## 2017-04-11 VITALS — BP 122/71 | HR 72 | Temp 98.5°F | Resp 18 | Ht 72.0 in | Wt 182.0 lb

## 2017-04-11 DIAGNOSIS — Z79899 Other long term (current) drug therapy: Secondary | ICD-10-CM | POA: Diagnosis not present

## 2017-04-11 DIAGNOSIS — R740 Nonspecific elevation of levels of transaminase and lactic acid dehydrogenase [LDH]: Secondary | ICD-10-CM

## 2017-04-11 DIAGNOSIS — C187 Malignant neoplasm of sigmoid colon: Secondary | ICD-10-CM | POA: Diagnosis not present

## 2017-04-11 DIAGNOSIS — R5383 Other fatigue: Secondary | ICD-10-CM | POA: Insufficient documentation

## 2017-04-11 DIAGNOSIS — G40909 Epilepsy, unspecified, not intractable, without status epilepticus: Secondary | ICD-10-CM | POA: Insufficient documentation

## 2017-04-11 DIAGNOSIS — Z9049 Acquired absence of other specified parts of digestive tract: Secondary | ICD-10-CM | POA: Insufficient documentation

## 2017-04-11 DIAGNOSIS — Z5111 Encounter for antineoplastic chemotherapy: Secondary | ICD-10-CM

## 2017-04-11 DIAGNOSIS — R6889 Other general symptoms and signs: Secondary | ICD-10-CM

## 2017-04-11 DIAGNOSIS — Z801 Family history of malignant neoplasm of trachea, bronchus and lung: Secondary | ICD-10-CM | POA: Diagnosis not present

## 2017-04-11 DIAGNOSIS — C779 Secondary and unspecified malignant neoplasm of lymph node, unspecified: Secondary | ICD-10-CM | POA: Insufficient documentation

## 2017-04-11 DIAGNOSIS — R7401 Elevation of levels of liver transaminase levels: Secondary | ICD-10-CM

## 2017-04-11 DIAGNOSIS — Z95828 Presence of other vascular implants and grafts: Secondary | ICD-10-CM

## 2017-04-11 LAB — COMPREHENSIVE METABOLIC PANEL
ALBUMIN: 4.1 g/dL (ref 3.5–5.0)
ALT: 56 U/L — ABNORMAL HIGH (ref 0–55)
ANION GAP: 10 (ref 3–11)
AST: 56 U/L — ABNORMAL HIGH (ref 5–34)
Alkaline Phosphatase: 111 U/L (ref 40–150)
BUN: 14 mg/dL (ref 7–26)
CHLORIDE: 104 mmol/L (ref 98–109)
CO2: 26 mmol/L (ref 22–29)
Calcium: 9.5 mg/dL (ref 8.4–10.4)
Creatinine, Ser: 1.01 mg/dL (ref 0.70–1.30)
GFR calc Af Amer: >60 mL/min (ref >60)
Glucose, Bld: 95 mg/dL (ref 70–140)
POTASSIUM: 4 mmol/L (ref 3.5–5.1)
Sodium: 140 mmol/L (ref 136–145)
Total Bilirubin: 1.1 mg/dL (ref 0.2–1.2)
Total Protein: 6.8 g/dL (ref 6.4–8.3)

## 2017-04-11 LAB — CBC WITH DIFFERENTIAL/PLATELET
BASOS PCT: 1 %
Basophils Absolute: 0 10*3/uL (ref 0.0–0.1)
EOS PCT: 3 %
Eosinophils Absolute: 0.1 10*3/uL (ref 0.0–0.5)
HEMATOCRIT: 36.2 % — AB (ref 38.4–49.9)
Hemoglobin: 12.6 g/dL — ABNORMAL LOW (ref 13.0–17.1)
Lymphocytes Relative: 40 %
Lymphs Abs: 2.2 10*3/uL (ref 0.9–3.3)
MCH: 36 pg — ABNORMAL HIGH (ref 27.2–33.4)
MCHC: 34.9 g/dL (ref 32.0–36.0)
MCV: 103.2 fL — AB (ref 79.3–98.0)
MONO ABS: 0.6 10*3/uL (ref 0.1–0.9)
MONOS PCT: 11 %
NEUTROS ABS: 2.5 10*3/uL (ref 1.5–6.5)
Neutrophils Relative %: 45 %
PLATELETS: 179 10*3/uL (ref 140–400)
RBC: 3.51 MIL/uL — ABNORMAL LOW (ref 4.20–5.82)
RDW: 21.3 % — AB (ref 11.0–14.6)
WBC: 5.4 10*3/uL (ref 4.0–10.3)

## 2017-04-11 MED ORDER — HEPARIN SOD (PORK) LOCK FLUSH 100 UNIT/ML IV SOLN
500.0000 [IU] | Freq: Once | INTRAVENOUS | Status: AC | PRN
  Administered 2017-04-11: 500 [IU]
  Filled 2017-04-11: qty 5

## 2017-04-11 MED ORDER — SODIUM CHLORIDE 0.9% FLUSH
10.0000 mL | Freq: Once | INTRAVENOUS | Status: AC
  Administered 2017-04-11: 10 mL
  Filled 2017-04-11: qty 10

## 2017-04-11 MED ORDER — PROCHLORPERAZINE MALEATE 10 MG PO TABS: 10.0000 mg | ORAL_TABLET | Freq: Four times a day (QID) | ORAL | 1 refills | Status: DC | PRN

## 2017-04-11 MED ORDER — DEXAMETHASONE SODIUM PHOSPHATE 10 MG/ML IJ SOLN
10.0000 mg | Freq: Once | INTRAMUSCULAR | Status: AC
  Administered 2017-04-11: 10 mg via INTRAVENOUS

## 2017-04-11 MED ORDER — HEPARIN SOD (PORK) LOCK FLUSH 100 UNIT/ML IV SOLN
500.0000 [IU] | Freq: Once | INTRAVENOUS | Status: AC
  Administered 2017-04-11: 500 [IU]
  Filled 2017-04-11: qty 5

## 2017-04-11 MED ORDER — PALONOSETRON HCL INJECTION 0.25 MG/5ML
INTRAVENOUS | Status: AC
  Filled 2017-04-11: qty 5

## 2017-04-11 MED ORDER — SODIUM CHLORIDE 0.9% FLUSH
10.0000 mL | INTRAVENOUS | Status: DC | PRN
  Administered 2017-04-11: 10 mL
  Filled 2017-04-11: qty 10

## 2017-04-11 MED ORDER — DEXAMETHASONE SODIUM PHOSPHATE 10 MG/ML IJ SOLN
INTRAMUSCULAR | Status: AC
  Filled 2017-04-11: qty 1

## 2017-04-11 MED ORDER — OXALIPLATIN CHEMO INJECTION 100 MG/20ML
110.0000 mg/m2 | Freq: Once | INTRAVENOUS | Status: AC
  Administered 2017-04-11: 220 mg via INTRAVENOUS
  Filled 2017-04-11: qty 40

## 2017-04-11 MED ORDER — DEXTROSE 5 % IV SOLN
Freq: Once | INTRAVENOUS | Status: AC
  Administered 2017-04-11: 13:00:00 via INTRAVENOUS

## 2017-04-11 MED ORDER — PALONOSETRON HCL INJECTION 0.25 MG/5ML
0.2500 mg | Freq: Once | INTRAVENOUS | Status: AC
  Administered 2017-04-11: 0.25 mg via INTRAVENOUS

## 2017-04-11 NOTE — Progress Notes (Signed)
[place]  [phone] [phone]  Clinic Follow up Note   Patient Care Team: [name], MD as PCP - General (Family Medicine) Nehemiah Settle, MD (Gastroenterology) Leighton Ruff, MD as Consulting Physician (General Surgery) Truitt Merle, MD as Consulting Physician (Hematology) 04/11/2017  CHIEF COMPLAINT: F/u stage IIIB rectosigmoid adenocarcinoma  SUMMARY OF ONCOLOGIC HISTORY: Oncology History   Cancer Staging Colon cancer [facility]) Staging form: Colon and Rectum, AJCC 8th Edition - Pathologic stage from 11/10/2016: Stage IIIB (pT3, pN2a, cM0) - Signed by Truitt Merle, MD on 11/22/2016       Colon cancer ([place])   10/01/2016 Initial Diagnosis    Colon cancer ([place])      10/01/2016 Procedure    Colonoscopy by Dr. Melina Copa: There is an obviously malignant mass at 28 cm in the sigmoid colon. He was circumferential and the colonoscopy could not be passed through the lesions. Multiple biopsies taken.      10/01/2016 Initial Biopsy    Sigmoid colon mass biopsy showed invasive adenocarcinoma.      10/04/2016 Imaging    CT abdomen/pelvis - no signs of metastatic disease, several enlarged mesenteric lymph nodes      10/13/2016 Imaging    CT chest, abdomen and the pelvis with contrast and showed no evidence of distant recurrence.       10/21/2016 Genetic Testing    The patient had genetic testing due to a personal history of colon cancer at 3.  He was tested for the Common Hereditary Cancer Panel.  The Hereditary Gene Panel offered by [facility] includes sequencing and/or deletion duplication testing of the following 46 genes: APC, ATM, AXIN2, BARD1, BMPR1A, BRCA1, BRCA2, BRIP1, CDH1, CDKN2A (p14ARF), CDKN2A (p16INK4a), CHEK2, CTNNA1, DICER1, EPCAM (Deletion/duplication testing only), GREM1 (promoter region deletion/duplication testing only), KIT, MEN1, MLH1, MSH2, MSH3, MSH6, MUTYH, NBN, NF1, NHTL1, PALB2, PDGFRA, PMS2, POLD1, POLE, PTEN, RAD50, RAD51C, RAD51D,  SDHB, SDHC, SDHD, SMAD4, SMARCA4. STK11, TP53, TSC1, TSC2, and VHL.  The following genes were evaluated for sequence changes only: SDHA and HOXB13 c.251G>A variant only.  Results: No pathogenic mutations identified.  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  The date of this test report is 10/21/2016.       11/10/2016 Pathology Results    Diagnosis- surgical pathology Colon, segmental resection for tumor, rectosigmoid ADENOCARCINOMA OF THE COLON, GRADE 3 (4.5 CM) THE TUMOR INVADES THROUGH THE MUSCULARIS PROPRIA INTO PERICOLONIC SOFT TISSUE (PT3) LYMPHOVASCULAR INVASION IS IDENTIFIED MARGINS OF RESECTION ARE NEGATIVE FOR CARCINOMA METASTATIC ADENOCARCINOMA IN FOUR OF TWENTY-TWO LYMPH NODES (4/22, PN2A)      11/10/2016 Surgery     XI ROBOT ASSISTED SIGMOIDECTOMY  SURGEON:  Surgeon(s): Hawken Boston, MD Leighton Ruff, MD       6/76/7209 Miscellaneous    Colon cancer MSI-stable       12/06/2016 -  Chemotherapy    CAPOX every 3 weeks for 6 months starting 12/06/16      CURRENT THERAPY: CAPOX every 3 weeks for 6 months starting 12/06/16   INTERVAL HISTORY: Mr. Ramsay returns for follow up as scheduled prior to cycle 7 CAPOX. He continues to work and exercise regularly while on treatment. He has very mild occasional fatigue. Takes compazine BID for 2 weeks while on xeloda to prevent nausea. No emesis, constipation, or diarrhea. Eating and drinking well with good appetite, no mucositis. Cold sensitivity to mouth and throat lasts 9 days after oxaliplatin, no numbness or tingling to hands  or feet. Hands are dry but not sensitive or red. He has no other specific complaints today.   REVIEW OF SYSTEMS:   Constitutional: Denies fevers, chills or abnormal weight loss (+) occasional fatigue, mild (+) good appetite Eyes: Denies blurriness of vision Ears, nose, mouth, throat, and face: Denies mucositis or sore throat Respiratory: Denies cough,  dyspnea or wheezes Cardiovascular: Denies palpitation, chest discomfort or lower extremity swelling Gastrointestinal:  Denies nausea, vomiting, constipation, diarrhea, heartburn or change in bowel habits (+) takes compazine BID with xeloda x14 days Skin: Denies abnormal skin rashes (+) dry hands  Lymphatics: Denies new lymphadenopathy or easy bruising Neurological:Denies numbness, tingling or new weaknesses (+) cold sensitivity with liquids x9 days after oxaliplatin  Behavioral/Psych: Mood is stable, no new changes  All other systems were reviewed with the patient and are negative.  MEDICAL HISTORY:  Past Medical History:  Diagnosis Date  . Colon cancer ([place])   . Family history of lung cancer   . Right inguinal hernia   . Seizures ([place])    3 seizures 2016    SURGICAL HISTORY: Past Surgical History:  Procedure Laterality Date  . APPENDECTOMY  2006  . COLON SURGERY     for colon cancer  . HERNIA REPAIR    . INGUINAL HERNIA REPAIR  10/15/2011   Procedure: HERNIA REPAIR INGUINAL ADULT;  Surgeon: Odis Hollingshead, MD;  Location: [facility];  Service: General;  Laterality: Right;  with mesh   . left knee arthroscopy     1995  . PORTACATH PLACEMENT Right 12/01/2016   Procedure: INSERTION PORT-A-CATH;  Surgeon: Leighton Ruff, MD;  Location: [facility];  Service: General;  Laterality: Right;  . PROCTOSCOPY  11/10/2016   Procedure: PROCTOSCOPY;  Surgeon: Leighton Ruff, MD;  Location: [facility];  Service: General;;  . VASECTOMY  10/15/2011   Procedure: VASECTOMY;  Surgeon: Claybon Jabs, MD;  Location: [facility];  Service: Urology;  Laterality: N/A;    I have reviewed the social history and family history with the patient and they are unchanged from previous note.  ALLERGIES:  has No Known Allergies.  MEDICATIONS:  Current Outpatient Medications  Medication Sig Dispense Refill  . capecitabine (XELODA) 500 MG tablet TAKE 4 TABLETS (2,000 MG TOTAL) BY MOUTH 2 (TWO) TIMES DAILY AFTER A MEAL. TAKE ON  DAYS 1-14 OF CHEMOTHERAPY. 112 tablet 1  . dexamethasone (DECADRON) 4 MG tablet Take 2 tablets (8 mg total) by mouth daily. Start the day after chemotherapy for 2 days. Take with food. 30 tablet 1  . diphenoxylate-atropine (LOMOTIL) 2.5-0.025 MG tablet Take 2 tablets by mouth every 6 (six) hours as needed.  0  . FLUZONE QUADRIVALENT 0.5 ML injection TO BE ADMINISTERED BY PHARMACIST FOR IMMUNIZATION  0  . LamoTRIgine XR 200 MG TB24 Take 400 mg by mouth at bedtime.  5  . lidocaine-prilocaine (EMLA) cream Apply 1 application topically as needed. 30 g 2  . Melatonin 3 MG TABS Take 3 mg by mouth at bedtime as needed (for sleep.).    Marland Kitchen Omega-3 Fatty Acids (FISH OIL PO) Take 2 capsules by mouth daily.    . ondansetron (ZOFRAN) 8 MG tablet Take 1 tablet (8 mg total) by mouth 2 (two) times daily as needed for refractory nausea / vomiting. Start on day 3 after chemotherapy. 30 tablet 1  . prochlorperazine (COMPAZINE) 10 MG tablet Take 1 tablet (10 mg total) by mouth every 6 (six) hours as needed (Nausea or vomiting). 40 tablet 1  No current facility-administered medications for this visit.     PHYSICAL EXAMINATION: ECOG PERFORMANCE STATUS: 1 - Symptomatic but completely ambulatory  Vitals:   04/11/17 1150  BP: 122/71  Pulse: 72  Resp: 18  Temp: 98.5 F (36.9 C)  SpO2: 100%   Filed Weights   04/11/17 1150  Weight: 182 lb (82.6 kg)    GENERAL:alert, no distress and comfortable SKIN: skin color, texture, turgor are normal, no rashes or significant lesions EYES: normal, Conjunctiva are pink and non-injected, sclera clear OROPHARYNX:no exudate, no erythema and lips, buccal mucosa, and tongue normal  NECK: supple, thyroid normal size, non-tender, without nodularity LYMPH:  no palpable cervical, supraclavicular, axillary, or inguinal lymphadenopathy  LUNGS: clear to auscultation bilaterally with normal breathing effort HEART: regular rate & rhythm and no murmurs and no lower extremity  edema ABDOMEN:abdomen soft, non-tender and normal bowel sounds Musculoskeletal:no cyanosis of digits and no clubbing  NEURO: alert & oriented x 3 with fluent speech, no focal motor/sensory deficits PAC without erythema   LABORATORY DATA:  I have reviewed the data as listed CBC Latest Ref Rng & Units 04/11/2017 03/21/2017 02/28/2017  WBC 4.0 - 10.3 K/uL 5.4 5.2 6.5  Hemoglobin 13.0 - 17.1 g/dL 12.6(L) 11.8(L) 12.1(L)  Hematocrit 38.4 - 49.9 % 36.2(L) 33.8(L) 34.3(L)  Platelets 140 - 400 K/uL 179 129(L) 203     CMP Latest Ref Rng & Units 04/11/2017 03/21/2017 02/28/2017  Glucose 70 - 140 mg/dL 95 92 107  BUN 7 - 26 mg/dL 14 12 12.8  Creatinine 0.70 - 1.30 mg/dL 1.01 0.87 1.1  Sodium 136 - 145 mmol/L 140 139 138  Potassium 3.5 - 5.1 mmol/L 4.0 3.4(L) 3.9  Chloride 98 - 109 mmol/L 104 106 -  CO2 22 - 29 mmol/L 26 25 26   Calcium 8.4 - 10.4 mg/dL 9.5 9.2 8.6  Total Protein 6.4 - 8.3 g/dL 6.8 6.3(L) 5.6(L)  Total Bilirubin 0.2 - 1.2 mg/dL 1.1 1.2 0.69  Alkaline Phos 40 - 150 U/L 111 88 90  AST 5 - 34 U/L 56(H) 48(H) 89(H)  ALT 0 - 55 U/L 56(H) 43 99(H)   PATHOLOGY RESULTS:   Diagnosis 11/10/16 Colon, segmental resection for tumor, rectosigmoid ADENOCARCINOMA OF THE COLON, GRADE 3 (4.5 CM) THE TUMOR INVADES THROUGH THE MUSCULARIS PROPRIA INTO PERICOLONIC SOFT TISSUE (PT3) LYMPHOVASCULAR INVASION IS IDENTIFIED MARGINS OF RESECTION ARE NEGATIVE FOR CARCINOMA METASTATIC ADENOCARCINOMA IN FOUR OF TWENTY-TWO LYMPH NODES (4/22, PN2A)  Microscopic Comment COLON AND RECTUM (INCLUDING TRANS-ANAL RESECTION): Specimen: Rectosigmoid colon Procedure: Segmental resection Tumor site: descending colon Specimen integrity: Intact Macroscopic intactness of mesorectum: Not applicable: NA Complete: x Near complete: NA Incomplete: NA Cannot be determined (specify): NA Macroscopic tumor perforation: through the muscularis propria into the subserosal adipose tissue Invasive tumor: Maximum size:  4.5 cm Histologic type(s): Adenocarcinoma Histologic grade and differentiation: G1: well differentiated/low grade G2: moderately differentiated/low grade G3: poorly differentiated/high grade G4: undifferentiated/high grade Type of polyp in which invasive carcinoma arose: Tubular adenoma Microscopic extension of invasive tumor: The tumor invades through muscularis propria into the subserosal adiposetissue Lymph-Vascular invasion: Identified Peri-neural invasion: Negative Tumor deposit(s) (discontinuous extramural extension): negative Resection margins: Proximal margin: negative Distal margin: Negative Circumferential (radial) (posterior ascending, posterior descending; lateral and posterior mid-rectum; and entire lower 1/3 rectum):negative Mesenteric margin (sigmoid and transverse): Negative Distance closest margin (if all above margins negative): 0.1 cm to radial margin Trans-anal resection margins only: Deep margin: NA Mucosal Margin: NA Distance closest mucosal margin (if negative): NA Treatment effect (neo-adjuvant  therapy): Negative Additional polyp(s): Negative Non-neoplastic findings: Unremarkable Lymph nodes: number examined 22; number positive: 4 Pathologic Staging: pT3, pN2a, pMx Ancillary studies: ordered   RADIOGRAPHIC STUDIES: I have personally reviewed the radiological images as listed and agreed with the findings in the report. No results found.   ASSESSMENT & PLAN: 37 y.o.  Caucasian male with PMH of seizure, presented with recurrent rectal bleeding   1. 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 Grinder appears stable today. He completed 6 cycles of adjuvant CAPOX. He is tolerating well overall. VS and weight are stable, labs reviewed. Hgb is stable, 12.6 today; thrombocytopenia resolved. Will proceed with cycle 7 today, continue Xeloda 2000 mg BID x14 days on and 7 days off. Continue oxaliplatin  at current dose 110 mg/m2. F/u in 3 weeks with final cycle. We briefly reviewed colon cancer surveillance which will include lab and f/u with physical exam q3-4 months for first few years, annual colonoscopy, and q6-12 month scan up to 5 years. Plan may change depending on his clinical picture. He understands.   2. Weight loss and decreased appetite -He reports good appetite, weight is increased to his goal weight, 180 range.  3. History of seizure -on lamictal; continue f/u with neuro  4. C.diff colitis 12/13/2016 -Completed 10 day course of flagyl and cipro on 12/24/16 and another 10 day course of flagyl as of 02/23/17. Stool was not tested with second episode. No diarrhea recently.  5. Cold sensitivity -Secondary to chemotherapy -Lasts 9 days after infusion, affects mostly throat and swallowing cold liquids. Does not affect his hands or feet. No other significant neuropathy.   6. transaminitis -likely secondary to chemotherapy. LFTs are mild and stable overall. Will continue to monitor.  PLAN -labs reviewed, proceed with cycle 7 CAPOX, 2000 mg BID x14 days on, 7 days off -Return for f/u with Dr. Burr Medico in 3 weeks for final cycle  -Refilled compazine  All questions were answered. The patient knows to call the clinic with any problems, questions or concerns. No barriers to learning was detected.     Alla Feeling, NP 04/11/17

## 2017-04-11 NOTE — Patient Instructions (Signed)
[facility] Discharge Instructions for Patients Receiving Chemotherapy  Today you received the following chemotherapy agents:  Oxaliplatin  To help prevent nausea and vomiting after your treatment, we encourage you to take your nausea medication as prescribed.   If you develop nausea and vomiting that is not controlled by your nausea medication, call the clinic.   BELOW ARE SYMPTOMS THAT SHOULD BE REPORTED IMMEDIATELY:  *FEVER GREATER THAN 100.5 F  *CHILLS WITH OR WITHOUT FEVER  NAUSEA AND VOMITING THAT IS NOT CONTROLLED WITH YOUR NAUSEA MEDICATION  *UNUSUAL SHORTNESS OF BREATH  *UNUSUAL BRUISING OR BLEEDING  TENDERNESS IN MOUTH AND THROAT WITH OR WITHOUT PRESENCE OF ULCERS  *URINARY PROBLEMS  *BOWEL PROBLEMS  UNUSUAL RASH Items with * indicate a potential emergency and should be followed up as soon as possible.  Feel free to call the clinic should you have any questions or concerns. The clinic phone number is [phone].  Please show the CHEMO ALERT CARD at check-in to the Emergency Department and triage nurse.   

## 2017-04-12 ENCOUNTER — Other Ambulatory Visit: Payer: Self-pay | Admitting: Pharmacist

## 2017-04-17 ENCOUNTER — Encounter: Payer: Self-pay | Admitting: Hematology

## 2017-04-27 MED FILL — CAPECITABINE 500 MG TABLET: 500 | 21 days supply | Qty: 112 | Fill #1

## 2017-05-02 ENCOUNTER — Telehealth: Payer: Self-pay | Admitting: Hematology

## 2017-05-02 ENCOUNTER — Inpatient Hospital Stay (HOSPITAL_BASED_OUTPATIENT_CLINIC_OR_DEPARTMENT_OTHER): Payer: No Typology Code available for payment source | Admitting: Hematology

## 2017-05-02 ENCOUNTER — Inpatient Hospital Stay: Payer: No Typology Code available for payment source

## 2017-05-02 ENCOUNTER — Inpatient Hospital Stay: Payer: No Typology Code available for payment source | Attending: Hematology

## 2017-05-02 ENCOUNTER — Encounter: Payer: Self-pay | Admitting: Hematology

## 2017-05-02 VITALS — BP 156/81 | HR 62 | Temp 98.8°F | Resp 16 | Ht 72.0 in | Wt 186.2 lb

## 2017-05-02 DIAGNOSIS — Z5111 Encounter for antineoplastic chemotherapy: Secondary | ICD-10-CM

## 2017-05-02 DIAGNOSIS — Z801 Family history of malignant neoplasm of trachea, bronchus and lung: Secondary | ICD-10-CM | POA: Insufficient documentation

## 2017-05-02 DIAGNOSIS — Z79899 Other long term (current) drug therapy: Secondary | ICD-10-CM | POA: Insufficient documentation

## 2017-05-02 DIAGNOSIS — R740 Nonspecific elevation of levels of transaminase and lactic acid dehydrogenase [LDH]: Secondary | ICD-10-CM | POA: Insufficient documentation

## 2017-05-02 DIAGNOSIS — C187 Malignant neoplasm of sigmoid colon: Secondary | ICD-10-CM | POA: Diagnosis not present

## 2017-05-02 DIAGNOSIS — Z8711 Personal history of peptic ulcer disease: Secondary | ICD-10-CM | POA: Diagnosis not present

## 2017-05-02 DIAGNOSIS — K4090 Unilateral inguinal hernia, without obstruction or gangrene, not specified as recurrent: Secondary | ICD-10-CM

## 2017-05-02 DIAGNOSIS — R634 Abnormal weight loss: Secondary | ICD-10-CM

## 2017-05-02 DIAGNOSIS — C772 Secondary and unspecified malignant neoplasm of intra-abdominal lymph nodes: Secondary | ICD-10-CM | POA: Insufficient documentation

## 2017-05-02 DIAGNOSIS — G40909 Epilepsy, unspecified, not intractable, without status epilepticus: Secondary | ICD-10-CM | POA: Diagnosis not present

## 2017-05-02 DIAGNOSIS — T451X5S Adverse effect of antineoplastic and immunosuppressive drugs, sequela: Secondary | ICD-10-CM | POA: Diagnosis not present

## 2017-05-02 DIAGNOSIS — R630 Anorexia: Secondary | ICD-10-CM | POA: Insufficient documentation

## 2017-05-02 DIAGNOSIS — Z95828 Presence of other vascular implants and grafts: Secondary | ICD-10-CM

## 2017-05-02 LAB — COMPREHENSIVE METABOLIC PANEL
ALK PHOS: 105 U/L (ref 40–150)
ALT: 46 U/L (ref 0–55)
AST: 47 U/L — AB (ref 5–34)
Albumin: 3.9 g/dL (ref 3.5–5.0)
Anion gap: 7 (ref 3–11)
BUN: 12 mg/dL (ref 7–26)
CALCIUM: 9.7 mg/dL (ref 8.4–10.4)
CO2: 26 mmol/L (ref 22–29)
CREATININE: 0.93 mg/dL (ref 0.70–1.30)
Chloride: 107 mmol/L (ref 98–109)
GFR calc Af Amer: >60 mL/min (ref >60)
Glucose, Bld: 94 mg/dL (ref 70–140)
POTASSIUM: 3.8 mmol/L (ref 3.5–5.1)
Sodium: 140 mmol/L (ref 136–145)
TOTAL PROTEIN: 6.7 g/dL (ref 6.4–8.3)
Total Bilirubin: 0.8 mg/dL (ref 0.2–1.2)

## 2017-05-02 LAB — CBC WITH DIFFERENTIAL/PLATELET
BASOS ABS: 0 10*3/uL (ref 0.0–0.1)
Basophils Relative: 0 %
Eosinophils Absolute: 0.1 10*3/uL (ref 0.0–0.5)
Eosinophils Relative: 3 %
HEMATOCRIT: 34.7 % — AB (ref 38.4–49.9)
Hemoglobin: 12.4 g/dL — ABNORMAL LOW (ref 13.0–17.1)
LYMPHS PCT: 41 %
Lymphs Abs: 2.1 10*3/uL (ref 0.9–3.3)
MCH: 36.2 pg — ABNORMAL HIGH (ref 27.2–33.4)
MCHC: 35.7 g/dL (ref 32.0–36.0)
MCV: 101.2 fL — AB (ref 79.3–98.0)
MONO ABS: 0.8 10*3/uL (ref 0.1–0.9)
Monocytes Relative: 15 %
NEUTROS ABS: 2.1 10*3/uL (ref 1.5–6.5)
Neutrophils Relative %: 41 %
Platelets: 135 10*3/uL — ABNORMAL LOW (ref 140–400)
RBC: 3.43 MIL/uL — ABNORMAL LOW (ref 4.20–5.82)
RDW: 17.2 % — AB (ref 11.0–14.6)
WBC: 5.1 10*3/uL (ref 4.0–10.3)

## 2017-05-02 MED ORDER — PALONOSETRON HCL INJECTION 0.25 MG/5ML
0.2500 mg | Freq: Once | INTRAVENOUS | Status: AC
  Administered 2017-05-02: 0.25 mg via INTRAVENOUS

## 2017-05-02 MED ORDER — DIPHENHYDRAMINE HCL 50 MG/ML IJ SOLN
INTRAMUSCULAR | Status: AC
  Filled 2017-05-02: qty 1

## 2017-05-02 MED ORDER — DEXTROSE 5 % IV SOLN
Freq: Once | INTRAVENOUS | Status: AC
  Administered 2017-05-02: 13:00:00 via INTRAVENOUS

## 2017-05-02 MED ORDER — SODIUM CHLORIDE 0.9% FLUSH
10.0000 mL | Freq: Once | INTRAVENOUS | Status: AC
  Administered 2017-05-02: 10 mL
  Filled 2017-05-02: qty 10

## 2017-05-02 MED ORDER — DEXAMETHASONE SODIUM PHOSPHATE 10 MG/ML IJ SOLN
10.0000 mg | Freq: Once | INTRAMUSCULAR | Status: AC
  Administered 2017-05-02: 10 mg via INTRAVENOUS

## 2017-05-02 MED ORDER — HEPARIN SOD (PORK) LOCK FLUSH 100 UNIT/ML IV SOLN
500.0000 [IU] | Freq: Once | INTRAVENOUS | Status: AC | PRN
  Administered 2017-05-02: 500 [IU]
  Filled 2017-05-02: qty 5

## 2017-05-02 MED ORDER — PALONOSETRON HCL INJECTION 0.25 MG/5ML
INTRAVENOUS | Status: AC
  Filled 2017-05-02: qty 5

## 2017-05-02 MED ORDER — OXALIPLATIN CHEMO INJECTION 100 MG/20ML
110.0000 mg/m2 | Freq: Once | INTRAVENOUS | Status: AC
  Administered 2017-05-02: 220 mg via INTRAVENOUS
  Filled 2017-05-02: qty 40

## 2017-05-02 MED ORDER — DIPHENHYDRAMINE HCL 50 MG/ML IJ SOLN
25.0000 mg | Freq: Once | INTRAMUSCULAR | Status: AC
  Administered 2017-05-02: 25 mg via INTRAVENOUS

## 2017-05-02 MED ORDER — SODIUM CHLORIDE 0.9% FLUSH
3.0000 mL | INTRAVENOUS | Status: DC | PRN
  Filled 2017-05-02: qty 10

## 2017-05-02 MED ORDER — DEXAMETHASONE SODIUM PHOSPHATE 10 MG/ML IJ SOLN
INTRAMUSCULAR | Status: AC
  Filled 2017-05-02: qty 1

## 2017-05-02 MED ORDER — SODIUM CHLORIDE 0.9% FLUSH
10.0000 mL | INTRAVENOUS | Status: DC | PRN
  Administered 2017-05-02: 10 mL
  Filled 2017-05-02: qty 10

## 2017-05-02 NOTE — Telephone Encounter (Signed)
Scheduled appt per 3/4 los - Gave patient AVS and calender per los. Central radiology to contact patient with ct schedule.  

## 2017-05-02 NOTE — Patient Instructions (Signed)

## 2017-05-02 NOTE — Progress Notes (Signed)
  Oncology Nurse Navigator Documentation  Navigator Location: [facility] (05/02/17 1314)   )Navigator Encounter Type: Treatment (05/02/17 1314)                       Treatment Phase: Final Chemo TX (05/02/17 1314)     Interventions: Psycho-social support (05/02/17 1314) Offered encouragement and support. Final chemo tx today.            Acuity: Level 1 (05/02/17 1314)         Time Spent with Patient: 15 (05/02/17 1314)

## 2017-05-02 NOTE — Progress Notes (Signed)
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 Melina Wallace: There is an obviously malignant mass at 28 cm in the sigmoid colon. He was circumferential and the colonoscopy could not be passed through the lesions. Multiple biopsies taken.      10/01/2016 Initial Biopsy    Sigmoid colon mass biopsy showed invasive adenocarcinoma.      10/04/2016 Imaging    CT abdomen/pelvis - no signs of metastatic disease, several enlarged mesenteric lymph nodes      10/13/2016 Imaging    CT chest, abdomen and the pelvis with contrast and showed no evidence of distant recurrence.       10/21/2016 Genetic Testing    The patient had genetic testing due to a personal history of colon cancer at 31.  He was tested for the Common Hereditary Cancer Panel.  The Hereditary Gene Panel offered by [facility] includes sequencing and/or deletion duplication testing of the following 46 genes: APC, ATM, AXIN2, BARD1, BMPR1A, BRCA1, BRCA2, BRIP1, CDH1, CDKN2A (p14ARF), CDKN2A (p16INK4a), CHEK2, CTNNA1, DICER1, EPCAM (Deletion/duplication testing only), GREM1 (promoter region deletion/duplication testing only), KIT, MEN1, MLH1, MSH2, MSH3, MSH6,  MUTYH, NBN, NF1, NHTL1, PALB2, PDGFRA, PMS2, POLD1, POLE, PTEN, RAD50, RAD51C, RAD51D, SDHB, SDHC, SDHD, SMAD4, SMARCA4. STK11, TP53, TSC1, TSC2, and VHL.  The following genes were evaluated for sequence changes only: SDHA and HOXB13 c.251G>A variant only.  Results: No pathogenic mutations identified.  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  The date of this test report is 10/21/2016.       11/10/2016 Pathology Results    Diagnosis- surgical pathology Colon, segmental resection for tumor, rectosigmoid ADENOCARCINOMA OF THE COLON, GRADE 3 (4.5 CM) THE TUMOR INVADES THROUGH THE MUSCULARIS PROPRIA INTO PERICOLONIC SOFT TISSUE (PT3) LYMPHOVASCULAR INVASION IS IDENTIFIED MARGINS OF RESECTION ARE NEGATIVE FOR CARCINOMA METASTATIC ADENOCARCINOMA IN FOUR OF TWENTY-TWO LYMPH NODES (4/22, PN2A)      11/10/2016 Surgery     XI ROBOT ASSISTED SIGMOIDECTOMY  SURGEON:  Surgeon(s): Andres Boston, MD Andres Ruff, MD       8/56/3149 Miscellaneous    Colon cancer MSI-stable       12/06/2016 -  Chemotherapy    CAPOX every 3 weeks for 6 months starting 12/06/16. Plan to complete 05/23/17        HISTORY OF PRESENTING ILLNESS:  Andres Wallace 37 y.o. male is here because of colorectal cancer. He presents today with his wife, Andres Wallace. He was referred by his surgeon Dr. Marcello Wallace for his stage III colon cancer.   Pt reports that 12-14 years ago he was diagnosed with stomach ulcers. He also had a confirmation scope later which confirmed this.  Initially, he presented to his gastroenterologist (Dr Andres Wallace) complaining of a 1-2 month history of rectal bleeding which he describes as maroon colored blood. He does note that over the Spring or early Summer that he experienced some bright-red blood as well. He also had developed some cramping abdominal pain throughout this time as well with his maroon colored stools. He was on an antibiotic at that time for a  non-cancerous mole removal which he thought was the source of his pain. His pain resolved after these antibiotics, but he reports that it later returned several weeks ago. He had a DRE performed which did confirm the presence of blood and a colonoscopy was subsequently also performed at that time which revealed mass of the sigmoid or descending colon. He had biopsy performed of this which showed invasive adenocarcinoma. His CEA level around this time was 1.7. On 10/04/16 he had a CT A/P performed which showed no signs of metastatic disease, but it did show several mesenteric lymph nodes which were enlarged. The next day on 10/05/16 he had follow-up with Dr Andres Wallace of [facility] who recommended a left hemicolectomy in a minimally invasive fashion. He was also referred to genetics and scheduled to have a CT Chest performed to complete his metastatic workup. On 11/10/16 he underwent left hemicolectomy which was performed without complication. He recovered from this well during his admission and was subsequently discharge on 11/12/16. His genetic screening was performed and was normal. He does not have a FHx of colon cancer. 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 He has one son and two daughters who are all young. His wife currently is out of work to take care of their children and the home, she previously worked as a Marine scientist for [employer].   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   Mr Wallace presents today for consult. Overall, he is doing well. He has been recovering from his surgery well. He notes that if he twists his abdomen too quickly  he will have some pain surrounding his incisional site, but otherwise this has not really bothered him. He also notes that he has lost approximately 10-12lbs  due to some appetite issues. His appetite has been returning slowly, however.  His energy levels have been good overall. He denies nausea, vomiting, changes in bowel habits, or any other associated symptoms.    CURRENT THERAPY: CAPOX every 3 weeks for 6 months starting 12/06/16, plan to complete 05/23/17.    INTERVAL HISTORY:   HUEL CENTOLA is here for a follow up and cycle 8 CAPOX. He presents today accompanied by his wife. He notes he is doing well and ready for his last cycle of CAPOX. His wife notes his homeopathic doctor would like him to start Tumeric.   On review of symptoms, pt denies neuropathy but has cold sensitivity that last 9-10 days. He has adequate appetite and has gained weight. He denies diarrhea. He denies abdominal pain or leg swelling.     MEDICAL HISTORY:  Past Medical History:  Diagnosis Date  . Colon cancer ([place])   . Family history of lung cancer   . Right inguinal hernia   . Seizures ([place])    3 seizures 2016    SURGICAL HISTORY: Past Surgical History:  Procedure Laterality Date  . APPENDECTOMY  2006  . COLON SURGERY     for colon cancer  . HERNIA REPAIR    . INGUINAL HERNIA REPAIR  10/15/2011   Procedure: HERNIA REPAIR INGUINAL ADULT;  Surgeon: Odis Hollingshead, MD;  Location: [facility];  Service: General;  Laterality: Right;  with mesh   . left knee arthroscopy     1995  . PORTACATH PLACEMENT Right 12/01/2016   Procedure: INSERTION PORT-A-CATH;  Surgeon: Andres Ruff, MD;  Location: [facility];  Service: General;  Laterality: Right;  . PROCTOSCOPY  11/10/2016   Procedure: PROCTOSCOPY;  Surgeon: Andres Ruff, MD;  Location: [facility];  Service: General;;  . VASECTOMY  10/15/2011   Procedure: VASECTOMY;  Surgeon: Claybon Jabs, MD;  Location: [facility];  Service: Urology;  Laterality: N/A;    SOCIAL HISTORY: Social History   Socioeconomic History  . Marital status: Married    Spouse name: Not on file  . Number of children: Not on file  . Years of  education: Not on file  . Highest education level: Not on file  Social Needs  . Financial resource strain: Not on file  . Food insecurity - worry: Not on file  . Food insecurity - inability: Not on file  . Transportation needs - medical: Not on file  . Transportation needs - non-medical: Not on file  Occupational History  . Not on file  Tobacco Use  . Smoking status: Never Smoker  . Smokeless tobacco: Never Used  Substance and Sexual Activity  . Alcohol use: Yes    Comment: rarely  . Drug use: No  . Sexual activity: Yes  Other Topics Concern  . Not on file  Social History Narrative  . Not on file    FAMILY HISTORY: Family History  Problem Relation Age of Onset  . Lung cancer Father 37  . Lung cancer Paternal Grandfather        dx in 1's, died at 47  . Heart attack Maternal Grandmother 58    ALLERGIES:  has No Known Allergies.  MEDICATIONS:  Current Outpatient Medications  Medication Sig Dispense Refill  . capecitabine (XELODA) 500 MG tablet TAKE 4 TABLETS (2,000 MG TOTAL) BY MOUTH 2 (TWO) TIMES DAILY AFTER A MEAL. TAKE ON DAYS 1-14 OF CHEMOTHERAPY. 112 tablet 1  . FLUZONE QUADRIVALENT 0.5 ML injection TO BE ADMINISTERED BY PHARMACIST FOR IMMUNIZATION  0  . LamoTRIgine XR 200 MG TB24 Take 400 mg by mouth at bedtime.  5  . lidocaine-prilocaine (EMLA) cream Apply 1 application topically as needed. 30 g 2  . Melatonin 3 MG TABS Take 3 mg by mouth at bedtime as needed (for sleep.).    Marland Kitchen Omega-3 Fatty Acids (FISH OIL PO) Take 2 capsules by mouth daily.    . prochlorperazine (COMPAZINE) 10 MG tablet Take 1 tablet (10 mg total) by mouth every 6 (six) hours as needed (Nausea or vomiting). 40 tablet 1  . dexamethasone (DECADRON) 4 MG tablet Take 2 tablets (8 mg total) by mouth daily. Start the day after chemotherapy for 2 days. Take with food. (Patient not taking: Reported on 05/02/2017) 30 tablet 1  . diphenoxylate-atropine (LOMOTIL) 2.5-0.025 MG tablet Take 2 tablets by mouth  every 6 (six) hours as needed.  0  . ondansetron (ZOFRAN) 8 MG tablet Take 1 tablet (8 mg total) by mouth 2 (two) times daily as needed for refractory nausea / vomiting. Start on day 3 after chemotherapy. (Patient not taking: Reported  on 05/02/2017) 30 tablet 1   No current facility-administered medications for this visit.    Facility-Administered Medications Ordered in Other Visits  Medication Dose Route Frequency Provider Last Rate Last Dose  . sodium chloride flush (NS) 0.9 % injection 10 mL  10 mL Intracatheter PRN Andres Merle, MD   10 mL at 05/02/17 1650  . sodium chloride flush (NS) 0.9 % injection 3 mL  3 mL Intravenous PRN Andres Merle, MD        REVIEW OF SYSTEMS:   Constitutional: Denies fevers, chills or abnormal night sweats. (+) weight gain (+) good appetite Eyes: Denies blurriness of vision, double vision or watery eyes Ears, nose, mouth, throat, and face: Denies mucositis or sore throat Respiratory: Denies cough, dyspnea or wheezes Cardiovascular: Denies palpitation, chest discomfort or lower extremity swelling Gastrointestinal:  Denies nausea, heartburn or change in bowel habits Skin: Denies abnormal skin rashes   Lymphatics: Denies new lymphadenopathy or easy bruising Neurological: Denies numbness, tingling or new weaknesses (+) cold sensitivity, temporary Behavioral/Psych: Mood is stable, no new changes  All other systems were reviewed with the patient and are negative.  PHYSICAL EXAMINATION: ECOG PERFORMANCE STATUS: 0 Vitals:   05/02/17 1137  BP: (!) 156/81  Pulse: 62  Resp: 16  Temp: 98.8 F (37.1 C)  SpO2: 100%     GENERAL:alert, no distress and comfortable SKIN: skin color, texture, turgor are normal, no rashes or significant lesions EYES: normal, conjunctiva are pink and non-injected, sclera clear OROPHARYNX:no exudate, no erythema and lips, buccal mucosa, and tongue normal  NECK: supple, thyroid normal size, non-tender, without nodularity LYMPH:  no palpable  lymphadenopathy in the cervical, axillary or inguinal LUNGS: clear to auscultation and percussion with normal breathing effort HEART: regular rate & rhythm and no murmurs and no lower extremity edema ABDOMEN:abdomen soft, non-tender and normal bowel sounds. Multiple small surgical incisions have healed well. No discharge or surrounding skin erythema. No organomegaly.  Musculoskeletal:no cyanosis of digits and no clubbing  PSYCH: alert & oriented x 3 with fluent speech NEURO: no focal motor/sensory deficits  LABORATORY DATA:  I have reviewed the data as listed CBC Latest Ref Rng & Units 05/02/2017 04/11/2017 03/21/2017  WBC 4.0 - 10.3 K/uL 5.1 5.4 5.2  Hemoglobin 13.0 - 17.1 g/dL 12.4(L) 12.6(L) 11.8(L)  Hematocrit 38.4 - 49.9 % 34.7(L) 36.2(L) 33.8(L)  Platelets 140 - 400 K/uL 135(L) 179 129(L)   CMP Latest Ref Rng & Units 05/02/2017 04/11/2017 03/21/2017  Glucose 70 - 140 mg/dL 94 95 92  BUN 7 - 26 mg/dL 12 14 12   Creatinine 0.70 - 1.30 mg/dL 0.93 1.01 0.87  Sodium 136 - 145 mmol/L 140 140 139  Potassium 3.5 - 5.1 mmol/L 3.8 4.0 3.4(L)  Chloride 98 - 109 mmol/L 107 104 106  CO2 22 - 29 mmol/L 26 26 25   Calcium 8.4 - 10.4 mg/dL 9.7 9.5 9.2  Total Protein 6.4 - 8.3 g/dL 6.7 6.8 6.3(L)  Total Bilirubin 0.2 - 1.2 mg/dL 0.8 1.1 1.2  Alkaline Phos 40 - 150 U/L 105 111 88  AST 5 - 34 U/L 47(H) 56(H) 48(H)  ALT 0 - 55 U/L 46 56(H) 43    PATHOLOGY RESULTS:   Diagnosis 11/10/16 Colon, segmental resection for tumor, rectosigmoid ADENOCARCINOMA OF THE COLON, GRADE 3 (4.5 CM) THE TUMOR INVADES THROUGH THE MUSCULARIS PROPRIA INTO PERICOLONIC SOFT TISSUE (PT3) LYMPHOVASCULAR INVASION IS IDENTIFIED MARGINS OF RESECTION ARE NEGATIVE FOR CARCINOMA METASTATIC ADENOCARCINOMA IN FOUR OF TWENTY-TWO LYMPH NODES (4/22, PN2A)  Microscopic  Comment COLON AND RECTUM (INCLUDING TRANS-ANAL RESECTION): Specimen: Rectosigmoid colon Procedure: Segmental resection Tumor site: descending colon Specimen  integrity: Intact Macroscopic intactness of mesorectum: Not applicable: NA Complete: x Near complete: NA Incomplete: NA Cannot be determined (specify): NA Macroscopic tumor perforation: through the muscularis propria into the subserosal adipose tissue Invasive tumor: Maximum size: 4.5 cm Histologic type(s): Adenocarcinoma Histologic grade and differentiation: G1: well differentiated/low grade G2: moderately differentiated/low grade G3: poorly differentiated/high grade G4: undifferentiated/high grade Type of polyp in which invasive carcinoma arose: Tubular adenoma Microscopic extension of invasive tumor: The tumor invades through muscularis propria into the subserosal adiposetissue Lymph-Vascular invasion: Identified Peri-neural invasion: Negative Tumor deposit(s) (discontinuous extramural extension): negative Resection margins: Proximal margin: negative Distal margin: Negative Circumferential (radial) (posterior ascending, posterior descending; lateral and posterior mid-rectum; and entire lower 1/3 rectum):negative Mesenteric margin (sigmoid and transverse): Negative Distance closest margin (if all above margins negative): 0.1 cm to radial margin Trans-anal resection margins only: Deep margin: NA Mucosal Margin: NA Distance closest mucosal margin (if negative): NA Treatment effect (neo-adjuvant therapy): Negative Additional polyp(s): Negative Non-neoplastic findings: Unremarkable Lymph nodes: number examined 22; number positive: 4 Pathologic Staging: pT3, pN2a, pMx Ancillary studies: ordered  RADIOGRAPHIC STUDIES: I have personally reviewed the radiological images as listed and agreed with the findings in the report. No results found.  ASSESSMENT: 37 y.o.  Caucasian male with PMH of seizure, presented with recurrent rectal bleeding   1. Left colon cancer of the sigmoid colon, invasive adenocarcinoma, GX,  pT3N2M0, stage IIIB, MSI stable -I previously reviewed his CT scan  findings, and surgical pathology results in great details with patient and his wife. He had several questions which I answered in great detail.  -He had a complete surgical resection with negative margins, the tumor was T3 lesion and 4/22 nodes were positive.  -Staging scan was negative for distant metastasis. -Due to the high-risk of recurrence, I recommend adjuvant chemotherapy CAPOX for 6 months. We discussed the option of switched to FOLFOX if he has significant side effects from CAPOX. He contracted C.  -He has been recovering very well from his 11/10/16 surgery. He had his port placed on 12/01/16 -He started CAPOX on 12/06/16 tolerating moderately well so far.  -He has a 0.5 cm subcutaneous nodule in his lower abdominal incision. Will monitor.  -I discussed cancer surveillance again. He will continue with CEA testing every 3-6 months and next CT scan after chemo treatments are done. If he develops symptoms of recurrence we will scan sooner.  -Plan for a total of 8 cycles if he tolerates well. He is tolerating well overall, no significant neuropathy. Cold sensitivity has been taking longer to recover, resolved now.  -Plan to get a surveillance CT scan after he completes adjuvant chemotherapy. -We reduced dose of oxaliplatin slightly to 110 mg/m2, due to his mild cytopenia. He is tolerating treatment well, I have low threshold to decrease or hold oxaliplatin for the next 2 cycles if his cytopenia gets worse or if he develops neuropathy, due to the small benefit -Has tolerated CAPOX well overall, labs reviewed and adequate to proceed with last cycle CAPOX today  -We discussed colon cancer surveillance in details. -Will do surveillance CT scan in 1 month, then continue surveillance regularly for the first 3 years along with labs, exam and f/u every 4-6 months.  -Will need colonoscopy in 09/2016. -He is fine to use Tumeric. I encouraged him to maintain a healthy diet and regular exercise with a positive  mindset.  -I  suggest the GI cancer support group and other resources to help him adjust back to normal life.  -He is fine to keep his port for another year in case of recurrence. Pt opted to remove port after scan.  -F/u in 1 month   2. Weight loss and decreased appetite  -I previously encouraged him to eat more if he is able. We will refer him to our dietician service to assist him with this. We will consider supplements in the future to help with this following chemotherapy.  -He has gained some weight back lately. But after C. Diff episode his weight decreased.  -He would like to get back to his 180 lb weight. I suggest he drinks ensure boosts to help.  -If he has increased fatigue, decreased appetite or weight loss, or uncontrolled n/v, he can take 1 tablet dexa daily for 3-5 days -He reports good appetite, weight is increased to his goal weight, 180 range.   3. History of seizure  -Continue Lamictal and a follow-up with his neurologist at [facility].  4. Cold sensitivity -Secondary to chemotherapy -Lasts 9 days after infusion, affects mostly throat and swallowing cold liquids. Does not affect his hands or feet. No other significant neuropathy. -Will monitor   6.  Transaminitis  -Secondary to chemotherapy, overall mild and stable, will monitor closely.  PLAN: -Lab reviewed, adequate to proceed last chemotherapy oxaliplatin today, he will start Xeloda tonight.   -F/u in 4-8 weeks with lab and CT CAP with contrast a few days before OV   Orders Placed This Encounter  Procedures  . CT Abdomen Pelvis W Contrast    Standing Status:   Future    Standing Expiration Date:   05/02/2018    Order Specific Question:   If indicated for the ordered procedure, I authorize the administration of contrast media per Radiology protocol    Answer:   Yes    Order Specific Question:   Preferred imaging location?    Answer:   [facility]    Order Specific Question:   Radiology Contrast Protocol -  do NOT remove file path    Answer:   \\[email or web address]  . CT Chest W Contrast    Standing Status:   Future    Standing Expiration Date:   05/02/2018    Order Specific Question:   If indicated for the ordered procedure, I authorize the administration of contrast media per Radiology protocol    Answer:   Yes    Order Specific Question:   Preferred imaging location?    Answer:   [facility]    Order Specific Question:   Radiology Contrast Protocol - do NOT remove file path    Answer:   [email or web address]     Andres Merle, MD 05/02/2017   This document serves as a record of services personally performed by Andres Merle, MD. It was created on her behalf by Joslyn Devon, a trained medical scribe. The creation of this record is based on the scribe's personal observations and the provider's statements to them.   I have reviewed the above documentation for accuracy and completeness, and I agree with the above.

## 2017-05-02 NOTE — Progress Notes (Signed)
OK to treat with elevated AST & hi dose oxaliplatin per Dr Burr Medico.

## 2017-05-02 NOTE — Patient Instructions (Signed)
[facility] Discharge Instructions for Patients Receiving Chemotherapy  Today you received the following chemotherapy agents:  oxaliplatin  To help prevent nausea and vomiting after your treatment, we encourage you to take your nausea medication.   If you develop nausea and vomiting that is not controlled by your nausea medication, call the clinic.   BELOW ARE SYMPTOMS THAT SHOULD BE REPORTED IMMEDIATELY:  *FEVER GREATER THAN 100.5 F  *CHILLS WITH OR WITHOUT FEVER  NAUSEA AND VOMITING THAT IS NOT CONTROLLED WITH YOUR NAUSEA MEDICATION  *UNUSUAL SHORTNESS OF BREATH  *UNUSUAL BRUISING OR BLEEDING  TENDERNESS IN MOUTH AND THROAT WITH OR WITHOUT PRESENCE OF ULCERS  *URINARY PROBLEMS  *BOWEL PROBLEMS  UNUSUAL RASH Items with * indicate a potential emergency and should be followed up as soon as possible.  Feel free to call the clinic should you have any questions or concerns. The clinic phone number is [phone].  Please show the [facility] at check-in to the Emergency Department and triage nurse.

## 2017-06-24 ENCOUNTER — Ambulatory Visit (HOSPITAL_COMMUNITY)
Admission: RE | Admit: 2017-06-24 | Discharge: 2017-06-24 | Disposition: A | Discharge status: Home / Self Care | Payer: No Typology Code available for payment source | Source: Ambulatory Visit | Attending: Hematology | Admitting: Hematology

## 2017-06-24 ENCOUNTER — Inpatient Hospital Stay: Payer: No Typology Code available for payment source | Attending: Hematology

## 2017-06-24 DIAGNOSIS — C772 Secondary and unspecified malignant neoplasm of intra-abdominal lymph nodes: Secondary | ICD-10-CM | POA: Insufficient documentation

## 2017-06-24 DIAGNOSIS — Z9889 Other specified postprocedural states: Secondary | ICD-10-CM | POA: Insufficient documentation

## 2017-06-24 DIAGNOSIS — R630 Anorexia: Secondary | ICD-10-CM | POA: Diagnosis not present

## 2017-06-24 DIAGNOSIS — C187 Malignant neoplasm of sigmoid colon: Secondary | ICD-10-CM

## 2017-06-24 DIAGNOSIS — G40909 Epilepsy, unspecified, not intractable, without status epilepticus: Secondary | ICD-10-CM | POA: Diagnosis not present

## 2017-06-24 DIAGNOSIS — R634 Abnormal weight loss: Secondary | ICD-10-CM | POA: Insufficient documentation

## 2017-06-24 DIAGNOSIS — R740 Nonspecific elevation of levels of transaminase and lactic acid dehydrogenase [LDH]: Secondary | ICD-10-CM | POA: Diagnosis not present

## 2017-06-24 DIAGNOSIS — Z801 Family history of malignant neoplasm of trachea, bronchus and lung: Secondary | ICD-10-CM | POA: Diagnosis not present

## 2017-06-24 DIAGNOSIS — Z800 Family history of malignant neoplasm of digestive organs: Secondary | ICD-10-CM | POA: Insufficient documentation

## 2017-06-24 DIAGNOSIS — Z85038 Personal history of other malignant neoplasm of large intestine: Secondary | ICD-10-CM | POA: Insufficient documentation

## 2017-06-24 DIAGNOSIS — G629 Polyneuropathy, unspecified: Secondary | ICD-10-CM | POA: Diagnosis not present

## 2017-06-24 DIAGNOSIS — R948 Abnormal results of function studies of other organs and systems: Secondary | ICD-10-CM | POA: Insufficient documentation

## 2017-06-24 DIAGNOSIS — Z9049 Acquired absence of other specified parts of digestive tract: Secondary | ICD-10-CM | POA: Diagnosis not present

## 2017-06-24 DIAGNOSIS — Z8711 Personal history of peptic ulcer disease: Secondary | ICD-10-CM | POA: Insufficient documentation

## 2017-06-24 LAB — CBC WITH DIFFERENTIAL/PLATELET
BASOS ABS: 0 10*3/uL (ref 0.0–0.1)
BASOS PCT: 1 %
EOS PCT: 1 %
Eosinophils Absolute: 0.1 10*3/uL (ref 0.0–0.5)
HCT: 39.7 % (ref 38.4–49.9)
Hemoglobin: 14 g/dL (ref 13.0–17.1)
LYMPHS PCT: 36 %
Lymphs Abs: 1.9 10*3/uL (ref 0.9–3.3)
MCH: 34.9 pg — ABNORMAL HIGH (ref 27.2–33.4)
MCHC: 35.2 g/dL (ref 32.0–36.0)
MCV: 99.3 fL — ABNORMAL HIGH (ref 79.3–98.0)
Monocytes Absolute: 0.4 10*3/uL (ref 0.1–0.9)
Monocytes Relative: 8 %
Neutro Abs: 2.9 10*3/uL (ref 1.5–6.5)
Neutrophils Relative %: 54 %
PLATELETS: 155 10*3/uL (ref 140–400)
RBC: 4 MIL/uL — AB (ref 4.20–5.82)
RDW: 13.2 % (ref 11.0–14.6)
WBC: 5.3 10*3/uL (ref 4.0–10.3)

## 2017-06-24 LAB — COMPREHENSIVE METABOLIC PANEL
ALBUMIN: 4.2 g/dL (ref 3.5–5.0)
ALT: 35 U/L (ref 0–55)
AST: 37 U/L — AB (ref 5–34)
Alkaline Phosphatase: 97 U/L (ref 40–150)
Anion gap: 9 (ref 3–11)
BUN: 16 mg/dL (ref 7–26)
CHLORIDE: 106 mmol/L (ref 98–109)
CO2: 26 mmol/L (ref 22–29)
CREATININE: 1.14 mg/dL (ref 0.70–1.30)
Calcium: 9.8 mg/dL (ref 8.4–10.4)
GFR calc Af Amer: >60 mL/min (ref >60)
GLUCOSE: 93 mg/dL (ref 70–140)
POTASSIUM: 4.5 mmol/L (ref 3.5–5.1)
SODIUM: 141 mmol/L (ref 136–145)
Total Bilirubin: 0.7 mg/dL (ref 0.2–1.2)
Total Protein: 7 g/dL (ref 6.4–8.3)

## 2017-06-24 MED ORDER — IOHEXOL 300 MG/ML  SOLN
100.0000 mL | Freq: Once | INTRAMUSCULAR | Status: AC | PRN
  Administered 2017-06-24: 100 mL via INTRAVENOUS

## 2017-06-25 NOTE — Progress Notes (Signed)
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 Melina Copa: There is an obviously malignant mass at 28 cm in the sigmoid colon. He was circumferential and the colonoscopy could not be passed through the lesions. Multiple biopsies taken.      10/01/2016 Initial Biopsy    Sigmoid colon mass biopsy showed invasive adenocarcinoma.      10/04/2016 Imaging    CT abdomen/pelvis - no signs of metastatic disease, several enlarged mesenteric lymph nodes      10/13/2016 Imaging    CT chest, abdomen and the pelvis with contrast and showed no evidence of distant recurrence.       10/21/2016 Genetic Testing    The patient had genetic testing due to a personal history of colon cancer at 32.  He was tested for the Common Hereditary Cancer Panel.  The Hereditary Gene Panel offered by [facility] includes sequencing and/or deletion duplication testing of the following 46 genes: APC, ATM, AXIN2, BARD1, BMPR1A, BRCA1, BRCA2, BRIP1, CDH1, CDKN2A (p14ARF), CDKN2A (p16INK4a), CHEK2, CTNNA1, DICER1, EPCAM (Deletion/duplication testing only), GREM1 (promoter region deletion/duplication testing only), KIT, MEN1, MLH1, MSH2, MSH3,  MSH6, MUTYH, NBN, NF1, NHTL1, PALB2, PDGFRA, PMS2, POLD1, POLE, PTEN, RAD50, RAD51C, RAD51D, SDHB, SDHC, SDHD, SMAD4, SMARCA4. STK11, TP53, TSC1, TSC2, and VHL.  The following genes were evaluated for sequence changes only: SDHA and HOXB13 c.251G>A variant only.  Results: No pathogenic mutations identified.  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  The date of this test report is 10/21/2016.       11/10/2016 Pathology Results    Diagnosis- surgical pathology Colon, segmental resection for tumor, rectosigmoid ADENOCARCINOMA OF THE COLON, GRADE 3 (4.5 CM) THE TUMOR INVADES THROUGH THE MUSCULARIS PROPRIA INTO PERICOLONIC SOFT TISSUE (PT3) LYMPHOVASCULAR INVASION IS IDENTIFIED MARGINS OF RESECTION ARE NEGATIVE FOR CARCINOMA METASTATIC ADENOCARCINOMA IN FOUR OF TWENTY-TWO LYMPH NODES (4/22, PN2A)      11/10/2016 Surgery     XI ROBOT ASSISTED SIGMOIDECTOMY  SURGEON:  Surgeon(s): Jaicion Boston, MD Leighton Ruff, MD       1/65/5374 Miscellaneous    Colon cancer MSI-stable       12/06/2016 - 05/23/2017 Chemotherapy    CAPOX every 3 weeks for 6 months       06/24/2017 Imaging    CT CAP W Contrast 06/24/17 IMPRESSION: 1. Status post surgical resection of a sigmoid colon cancer. No CT findings suspicious for residual tumor, recurrent tumor, regional adenopathy or distant metastatic disease. 2. No acute abdominal findings.       HISTORY OF PRESENTING ILLNESS:  Andres Wallace 37 y.o. male is here because of colorectal cancer. He presents today with his wife,  [name]. He was referred by his surgeon Dr. Marcello Moores for his stage III colon cancer.   Pt reports that 12-14 years ago he was diagnosed with stomach ulcers. He also had a confirmation scope later which confirmed this.  Initially, he presented to his gastroenterologist (Dr Nehemiah Settle) complaining of a 1-2 month history of rectal bleeding which he describes as maroon colored blood. He does  note that over the Spring or early Summer that he experienced some bright-red blood as well. He also had developed some cramping abdominal pain throughout this time as well with his maroon colored stools. He was on an antibiotic at that time for a non-cancerous mole removal which he thought was the source of his pain. His pain resolved after these antibiotics, but he reports that it later returned several weeks ago. He had a DRE performed which did confirm the presence of blood and a colonoscopy was subsequently also performed at that time which revealed mass of the sigmoid or descending colon. He had biopsy performed of this which showed invasive adenocarcinoma. His CEA level around this time was 1.7. On 10/04/16 he had a CT A/P performed which showed no signs of metastatic disease, but it did show several mesenteric lymph nodes which were enlarged. The next day on 10/05/16 he had follow-up with Dr Marcello Moores of [facility] who recommended a left hemicolectomy in a minimally invasive fashion. He was also referred to genetics and scheduled to have a CT Chest performed to complete his metastatic workup. On 11/10/16 he underwent left hemicolectomy which was performed without complication. He recovered from this well during his admission and was subsequently discharge on 11/12/16. His genetic screening was performed and was normal. He does not have a FHx of colon cancer. 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 He has one son and two daughters who are all young. His wife currently is out of work to take care of their children and the home, she previously worked as a Marine scientist for [employer].   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   Mr Catala presents today for consult. Overall, he is doing well. He has  been recovering from his surgery well. He notes that if he twists his abdomen too quickly  he will have some pain surrounding his incisional site, but otherwise this has not really bothered him. He also notes that he has lost approximately 10-12lbs due to some appetite issues. His appetite has been returning slowly, however.  His energy levels have been good overall. He denies nausea, vomiting, changes in bowel habits, or any other associated symptoms.   CURRENT THERAPY: Surveillance  INTERVAL HISTORY:   RICCI DIROCCO is here for a follow up after completing adjuvant chemotherapy. He presents to the clinic today accompanied by his wife. He notes he is doing well and has fully recovered from chemotherapy energy wise. He reports he still has mild numbness in his fingertips and toes. He is able to walk without losing balance. He endorses good appetite and normal bowel movements.   On review of systems, pt denies new pain, or any other complaints at this time. Pertinent positives are listed and detailed within the above HPI.    MEDICAL HISTORY:  Past Medical History:  Diagnosis Date  . Colon  cancer ([place])   . Family history of lung cancer   . Right inguinal hernia   . Seizures ([place])    3 seizures 2016    SURGICAL HISTORY: Past Surgical History:  Procedure Laterality Date  . APPENDECTOMY  2006  . COLON SURGERY     for colon cancer  . HERNIA REPAIR    . INGUINAL HERNIA REPAIR  10/15/2011   Procedure: HERNIA REPAIR INGUINAL ADULT;  Surgeon: Odis Hollingshead, MD;  Location: [facility];  Service: General;  Laterality: Right;  with mesh   . left knee arthroscopy     1995  . PORTACATH PLACEMENT Right 12/01/2016   Procedure: INSERTION PORT-A-CATH;  Surgeon: Leighton Ruff, MD;  Location: [facility];  Service: General;  Laterality: Right;  . PROCTOSCOPY  11/10/2016   Procedure: PROCTOSCOPY;  Surgeon: Leighton Ruff, MD;  Location: [facility];  Service: General;;  . VASECTOMY  10/15/2011   Procedure:  VASECTOMY;  Surgeon: Claybon Jabs, MD;  Location: [facility];  Service: Urology;  Laterality: N/A;    SOCIAL HISTORY: Social History   Socioeconomic History  . Marital status: Married    Spouse name: Not on file  . Number of children: Not on file  . Years of education: Not on file  . Highest education level: Not on file  Occupational History  . Not on file  Social Needs  . Financial resource strain: Not on file  . Food insecurity:    Worry: Not on file    Inability: Not on file  . Transportation needs:    Medical: Not on file    Non-medical: Not on file  Tobacco Use  . Smoking status: Never Smoker  . Smokeless tobacco: Never Used  Substance and Sexual Activity  . Alcohol use: Yes    Comment: rarely  . Drug use: No  . Sexual activity: Yes  Lifestyle  . Physical activity:    Days per week: Not on file    Minutes per session: Not on file  . Stress: Not on file  Relationships  . Social connections:    Talks on phone: Not on file    Gets together: Not on file    Attends religious service: Not on file    Active member of club or organization: Not on file    Attends meetings of clubs or organizations: Not on file    Relationship status: Not on file  . Intimate partner violence:    Fear of current or ex partner: Not on file    Emotionally abused: Not on file    Physically abused: Not on file    Forced sexual activity: Not on file  Other Topics Concern  . Not on file  Social History Narrative  . Not on file    FAMILY HISTORY: Family History  Problem Relation Age of Onset  . Lung cancer Father 33  . Lung cancer Paternal Grandfather        dx in 58's, died at 64  . Heart attack Maternal Grandmother 58    ALLERGIES:  has No Known Allergies.  MEDICATIONS:  Current Outpatient Medications  Medication Sig Dispense Refill  . dexamethasone (DECADRON) 4 MG tablet Take 2 tablets (8 mg total) by mouth daily. Start the day after chemotherapy for 2 days. Take with food.  (Patient not taking: Reported on 05/02/2017) 30 tablet 1  . diphenoxylate-atropine (LOMOTIL) 2.5-0.025 MG tablet Take 2 tablets by mouth every 6 (six) hours as needed.  0  . FLUZONE  QUADRIVALENT 0.5 ML injection TO BE ADMINISTERED BY PHARMACIST FOR IMMUNIZATION  0  . LamoTRIgine XR 200 MG TB24 Take 400 mg by mouth at bedtime.  5  . lidocaine-prilocaine (EMLA) cream Apply 1 application topically as needed. 30 g 2  . Melatonin 3 MG TABS Take 3 mg by mouth at bedtime as needed (for sleep.).    Marland Kitchen Omega-3 Fatty Acids (FISH OIL PO) Take 2 capsules by mouth daily.     No current facility-administered medications for this visit.     REVIEW OF SYSTEMS:   Constitutional: Denies fevers, chills or abnormal night sweats. (+) weight gain (+) good appetite Eyes: Denies blurriness of vision, double vision or watery eyes Ears, nose, mouth, throat, and face: Denies mucositis or sore throat Respiratory: Denies cough, dyspnea or wheezes Cardiovascular: Denies palpitation, chest discomfort or lower extremity swelling Gastrointestinal:  Denies nausea, heartburn or change in bowel habits Skin: Denies abnormal skin rashes   Lymphatics: Denies new lymphadenopathy or easy bruising Neurological: Denies new weaknesses (+) peripheral neuropathy Behavioral/Psych: Mood is stable, no new changes  All other systems were reviewed with the patient and are negative.  PHYSICAL EXAMINATION: ECOG PERFORMANCE STATUS: 0 Vitals:   06/27/17 0836  BP: (!) 150/79  Pulse: 73  Resp: 18  Temp: 97.7 F (36.5 C)  SpO2: 100%     GENERAL:alert, no distress and comfortable SKIN: skin color, texture, turgor are normal, no rashes or significant lesions EYES: normal, conjunctiva are pink and non-injected, sclera clear OROPHARYNX:no exudate, no erythema and lips, buccal mucosa, and tongue normal  NECK: supple, thyroid normal size, non-tender, without nodularity LYMPH:  no palpable lymphadenopathy in the cervical, axillary or  inguinal LUNGS: clear to auscultation and percussion with normal breathing effort HEART: regular rate & rhythm and no murmurs and no lower extremity edema ABDOMEN:abdomen soft, non-tender and normal bowel sounds. Multiple small surgical incisions have healed well. No discharge or surrounding skin erythema. No organomegaly.  Musculoskeletal:no cyanosis of digits and no clubbing  PSYCH: alert & oriented x 3 with fluent speech NEURO: no focal motor/sensory deficits  LABORATORY DATA:  I have reviewed the data as listed CBC Latest Ref Rng & Units 06/24/2017 05/02/2017 04/11/2017  WBC 4.0 - 10.3 K/uL 5.3 5.1 5.4  Hemoglobin 13.0 - 17.1 g/dL 14.0 12.4(L) 12.6(L)  Hematocrit 38.4 - 49.9 % 39.7 34.7(L) 36.2(L)  Platelets 140 - 400 K/uL 155 135(L) 179   CMP Latest Ref Rng & Units 06/24/2017 05/02/2017 04/11/2017  Glucose 70 - 140 mg/dL 93 94 95  BUN 7 - 26 mg/dL 16 12 14   Creatinine 0.70 - 1.30 mg/dL 1.14 0.93 1.01  Sodium 136 - 145 mmol/L 141 140 140  Potassium 3.5 - 5.1 mmol/L 4.5 3.8 4.0  Chloride 98 - 109 mmol/L 106 107 104  CO2 22 - 29 mmol/L 26 26 26   Calcium 8.4 - 10.4 mg/dL 9.8 9.7 9.5  Total Protein 6.4 - 8.3 g/dL 7.0 6.7 6.8  Total Bilirubin 0.2 - 1.2 mg/dL 0.7 0.8 1.1  Alkaline Phos 40 - 150 U/L 97 105 111  AST 5 - 34 U/L 37(H) 47(H) 56(H)  ALT 0 - 55 U/L 35 46 56(H)    PATHOLOGY RESULTS:   Diagnosis 11/10/16 Colon, segmental resection for tumor, rectosigmoid ADENOCARCINOMA OF THE COLON, GRADE 3 (4.5 CM) THE TUMOR INVADES THROUGH THE MUSCULARIS PROPRIA INTO PERICOLONIC SOFT TISSUE (PT3) LYMPHOVASCULAR INVASION IS IDENTIFIED MARGINS OF RESECTION ARE NEGATIVE FOR CARCINOMA METASTATIC ADENOCARCINOMA IN FOUR OF TWENTY-TWO LYMPH NODES (4/22, PN2A)  Microscopic Comment COLON AND RECTUM (INCLUDING TRANS-ANAL RESECTION): Specimen: Rectosigmoid colon Procedure: Segmental resection Tumor site: descending colon Specimen integrity: Intact Macroscopic intactness of mesorectum: Not  applicable: NA Complete: x Near complete: NA Incomplete: NA Cannot be determined (specify): NA Macroscopic tumor perforation: through the muscularis propria into the subserosal adipose tissue Invasive tumor: Maximum size: 4.5 cm Histologic type(s): Adenocarcinoma Histologic grade and differentiation: G1: well differentiated/low grade G2: moderately differentiated/low grade G3: poorly differentiated/high grade G4: undifferentiated/high grade Type of polyp in which invasive carcinoma arose: Tubular adenoma Microscopic extension of invasive tumor: The tumor invades through muscularis propria into the subserosal adiposetissue Lymph-Vascular invasion: Identified Peri-neural invasion: Negative Tumor deposit(s) (discontinuous extramural extension): negative Resection margins: Proximal margin: negative Distal margin: Negative Circumferential (radial) (posterior ascending, posterior descending; lateral and posterior mid-rectum; and entire lower 1/3 rectum):negative Mesenteric margin (sigmoid and transverse): Negative Distance closest margin (if all above margins negative): 0.1 cm to radial margin Trans-anal resection margins only: Deep margin: NA Mucosal Margin: NA Distance closest mucosal margin (if negative): NA Treatment effect (neo-adjuvant therapy): Negative Additional polyp(s): Negative Non-neoplastic findings: Unremarkable Lymph nodes: number examined 22; number positive: 4 Pathologic Staging: pT3, pN2a, pMx Ancillary studies: ordered  RADIOGRAPHIC STUDIES: I have personally reviewed the radiological images as listed and agreed with the findings in the report.  CT CAP W Contrast 06/24/17 IMPRESSION: 1. Status post surgical resection of a sigmoid colon cancer. No CT findings suspicious for residual tumor, recurrent tumor, regional adenopathy or distant metastatic disease. 2. No acute abdominal findings.  Ct Chest W Contrast  Result Date: 06/24/2017 CLINICAL DATA:   Restaging metastatic colon cancer. Initial diagnosis August 2018 EXAM: CT CHEST, ABDOMEN, AND PELVIS WITH CONTRAST TECHNIQUE: Multidetector CT imaging of the chest, abdomen and pelvis was performed following the standard protocol during bolus administration of intravenous contrast. CONTRAST:  149m OMNIPAQUE IOHEXOL 300 MG/ML  SOLN COMPARISON:  CT scan 10/03/2016 FINDINGS: CT CHEST FINDINGS Cardiovascular: The heart is normal in size. No pericardial effusion. The aorta and branch vessels are normal. Mediastinum/Nodes: No mediastinal or hilar mass or lymphadenopathy. Small amount of residual thymic tissue noted in the anterior mediastinum. The esophagus is grossly normal. Lungs/Pleura: No acute pulmonary findings. No pulmonary lesions to suggest pulmonary metastatic disease. No pleural effusion. Musculoskeletal: No chest wall mass, supraclavicular or axillary lymphadenopathy. The right-sided Port-A-Cath is stable. The thyroid gland appears normal. No significant bony findings. CT ABDOMEN PELVIS FINDINGS Hepatobiliary: No focal hepatic lesions to suggest metastatic disease. The gallbladder is normal. No common bile duct dilatation. Pancreas: No mass, inflammation or ductal dilatation. Spleen: Normal size.  No focal lesions. Adrenals/Urinary Tract: The adrenal glands and kidneys are unremarkable. The bladder is normal. Stomach/Bowel: The stomach, duodenum, small bowel and colon are unremarkable. No acute inflammatory changes, mass lesions or obstructive findings. Surgical changes noted from the colon cancer resection at the mid sigmoid colon region. No findings for recurrent tumor. No adenopathy. Vascular/Lymphatic: The aorta and branch vessels are normal. The major venous structures are normal. No mesenteric or retroperitoneal mass or adenopathy. Small scattered lymph nodes are stable. Reproductive: The prostate gland and seminal vesicles are unremarkable. Other: No pelvic mass or adenopathy. No free pelvic fluid  collections. No inguinal mass or adenopathy. Musculoskeletal: No significant bony findings. Incidental bilateral pars defects at L5. IMPRESSION: 1. Status post surgical resection of a sigmoid colon cancer. No CT findings suspicious for residual tumor, recurrent tumor, regional adenopathy or distant metastatic disease. 2. No acute abdominal findings. Electronically Signed   By: PMamie Nick  Gallerani M.D.   On: 06/24/2017 14:24   Ct Abdomen Pelvis W Contrast  Result Date: 06/24/2017 CLINICAL DATA:  Restaging metastatic colon cancer. Initial diagnosis August 2018 EXAM: CT CHEST, ABDOMEN, AND PELVIS WITH CONTRAST TECHNIQUE: Multidetector CT imaging of the chest, abdomen and pelvis was performed following the standard protocol during bolus administration of intravenous contrast. CONTRAST:  165m OMNIPAQUE IOHEXOL 300 MG/ML  SOLN COMPARISON:  CT scan 10/03/2016 FINDINGS: CT CHEST FINDINGS Cardiovascular: The heart is normal in size. No pericardial effusion. The aorta and branch vessels are normal. Mediastinum/Nodes: No mediastinal or hilar mass or lymphadenopathy. Small amount of residual thymic tissue noted in the anterior mediastinum. The esophagus is grossly normal. Lungs/Pleura: No acute pulmonary findings. No pulmonary lesions to suggest pulmonary metastatic disease. No pleural effusion. Musculoskeletal: No chest wall mass, supraclavicular or axillary lymphadenopathy. The right-sided Port-A-Cath is stable. The thyroid gland appears normal. No significant bony findings. CT ABDOMEN PELVIS FINDINGS Hepatobiliary: No focal hepatic lesions to suggest metastatic disease. The gallbladder is normal. No common bile duct dilatation. Pancreas: No mass, inflammation or ductal dilatation. Spleen: Normal size.  No focal lesions. Adrenals/Urinary Tract: The adrenal glands and kidneys are unremarkable. The bladder is normal. Stomach/Bowel: The stomach, duodenum, small bowel and colon are unremarkable. No acute inflammatory changes, mass  lesions or obstructive findings. Surgical changes noted from the colon cancer resection at the mid sigmoid colon region. No findings for recurrent tumor. No adenopathy. Vascular/Lymphatic: The aorta and branch vessels are normal. The major venous structures are normal. No mesenteric or retroperitoneal mass or adenopathy. Small scattered lymph nodes are stable. Reproductive: The prostate gland and seminal vesicles are unremarkable. Other: No pelvic mass or adenopathy. No free pelvic fluid collections. No inguinal mass or adenopathy. Musculoskeletal: No significant bony findings. Incidental bilateral pars defects at L5. IMPRESSION: 1. Status post surgical resection of a sigmoid colon cancer. No CT findings suspicious for residual tumor, recurrent tumor, regional adenopathy or distant metastatic disease. 2. No acute abdominal findings. Electronically Signed   By: PMarijo SanesM.D.   On: 06/24/2017 14:24    ASSESSMENT: 37y.o.  Caucasian male with PMH of seizure, presented with recurrent rectal bleeding   1. Left colon cancer of the sigmoid colon, invasive adenocarcinoma, GX,  pT3N2M0, stage IIIB, MSI stable -I previously reviewed his CT scan findings, and surgical pathology results in great details with patient and his wife. He had several questions which I answered in great detail.  -He had a complete surgical resection with negative margins, the tumor was T3 lesion and 4/22 nodes were positive.  -Staging scan was negative for distant metastasis. -Due to the high-risk of recurrence, I recommended adjuvant chemotherapy CAPOX for 6 months. He tolerated very well and completed the full course  - Pt opted to remove port after scan. I will send a message to Dr. TMarcello Moores-CT CAP W Contrast from 06/24/17 revealed no findings suspicious for residual tumor, recurrent tumor, regional adenopathy or distant metastatic disease. I discussed these results with the pt and his wife. They are pleased.  -Labs reviewed, his  anemia secondary to chemotherapy has resolved and his elevated liver enzymes are almost resolved, AST is 37. Will monitor  -We discussed the surveillance plan, we will follow up closely in the first 2-3 years with scans every 6-12 months and f/u with lab every 3-4 months. Plan for another scan at the end of this year.  -Patient did not have a full colonoscopy at the diagnosis, he will schedule colonoscopy  as soon as possible.    -F/u in 3 months with lab, plan to repeat scan at the end of 2019   2. History of seizure  -Continue Lamictal and a follow-up with his neurologist at [facility].  3.  Peripheral neuropathy, G1 -He had cold sensitivity secondary to chemotherapy -He has some mild numbness on fingers and toes, no impact on his functions -Will monitor   6.  Transaminitis  -Secondary to chemotherapy, overall mild and stable, will monitor closely. -almost resolved, AST 37 (06/27/17)  PLAN: -CT CAP W Contrast reviewed with pt and his wife, no sign of recurrence -colonoscopy in the next few month, he will schedule it in [place]  -I will send message to Dr. Marcello Moores about port removal  -F/u in 3 months  No orders of the defined types were placed in this encounter.  This document serves as a record of services personally performed by Truitt Merle, MD. It was created on her behalf by Theresia Bough, a trained medical scribe. The creation of this record is based on the scribe's personal observations and the provider's statements to them.   I have reviewed the above documentation for accuracy and completeness, and I agree with the above.    Truitt Merle, MD 06/27/2017 8:56 AM

## 2017-06-27 ENCOUNTER — Telehealth: Payer: Self-pay | Admitting: Hematology

## 2017-06-27 ENCOUNTER — Ambulatory Visit: Payer: Self-pay | Admitting: General Surgery

## 2017-06-27 ENCOUNTER — Inpatient Hospital Stay (HOSPITAL_BASED_OUTPATIENT_CLINIC_OR_DEPARTMENT_OTHER): Payer: No Typology Code available for payment source | Admitting: Hematology

## 2017-06-27 ENCOUNTER — Encounter: Payer: Self-pay | Admitting: Hematology

## 2017-06-27 VITALS — BP 150/79 | HR 73 | Temp 97.7°F | Resp 18 | Ht 72.0 in | Wt 184.6 lb

## 2017-06-27 DIAGNOSIS — R740 Nonspecific elevation of levels of transaminase and lactic acid dehydrogenase [LDH]: Secondary | ICD-10-CM | POA: Diagnosis not present

## 2017-06-27 DIAGNOSIS — Z801 Family history of malignant neoplasm of trachea, bronchus and lung: Secondary | ICD-10-CM

## 2017-06-27 DIAGNOSIS — G629 Polyneuropathy, unspecified: Secondary | ICD-10-CM

## 2017-06-27 DIAGNOSIS — C772 Secondary and unspecified malignant neoplasm of intra-abdominal lymph nodes: Secondary | ICD-10-CM

## 2017-06-27 DIAGNOSIS — Z800 Family history of malignant neoplasm of digestive organs: Secondary | ICD-10-CM

## 2017-06-27 DIAGNOSIS — R630 Anorexia: Secondary | ICD-10-CM

## 2017-06-27 DIAGNOSIS — C187 Malignant neoplasm of sigmoid colon: Secondary | ICD-10-CM | POA: Diagnosis not present

## 2017-06-27 DIAGNOSIS — R634 Abnormal weight loss: Secondary | ICD-10-CM | POA: Diagnosis not present

## 2017-06-27 DIAGNOSIS — G40909 Epilepsy, unspecified, not intractable, without status epilepticus: Secondary | ICD-10-CM

## 2017-06-27 DIAGNOSIS — R948 Abnormal results of function studies of other organs and systems: Secondary | ICD-10-CM | POA: Diagnosis not present

## 2017-06-27 DIAGNOSIS — Z8711 Personal history of peptic ulcer disease: Secondary | ICD-10-CM | POA: Diagnosis not present

## 2017-06-27 NOTE — Telephone Encounter (Signed)
Appointments scheduled AVS/Calendar printed per 4/29 los °

## 2017-06-30 ENCOUNTER — Other Ambulatory Visit: Payer: Self-pay

## 2017-06-30 ENCOUNTER — Encounter (HOSPITAL_COMMUNITY): Payer: Self-pay

## 2017-07-07 ENCOUNTER — Other Ambulatory Visit: Payer: Self-pay

## 2017-07-07 ENCOUNTER — Encounter (HOSPITAL_COMMUNITY)
Admission: RE | Disposition: A | Discharge status: Home / Self Care | Payer: Self-pay | Source: Ambulatory Visit | Attending: General Surgery

## 2017-07-07 ENCOUNTER — Ambulatory Visit (HOSPITAL_COMMUNITY)
Admission: RE | Admit: 2017-07-07 | Discharge: 2017-07-07 | Disposition: A | Discharge status: Home / Self Care | Payer: No Typology Code available for payment source | Source: Ambulatory Visit | Attending: General Surgery | Admitting: General Surgery

## 2017-07-07 ENCOUNTER — Ambulatory Visit (HOSPITAL_COMMUNITY): Payer: No Typology Code available for payment source | Admitting: Anesthesiology

## 2017-07-07 ENCOUNTER — Encounter (HOSPITAL_COMMUNITY): Payer: Self-pay | Admitting: *Deleted

## 2017-07-07 DIAGNOSIS — Z452 Encounter for adjustment and management of vascular access device: Secondary | ICD-10-CM | POA: Diagnosis present

## 2017-07-07 DIAGNOSIS — Z85038 Personal history of other malignant neoplasm of large intestine: Secondary | ICD-10-CM | POA: Insufficient documentation

## 2017-07-07 DIAGNOSIS — Z9221 Personal history of antineoplastic chemotherapy: Secondary | ICD-10-CM | POA: Diagnosis not present

## 2017-07-07 HISTORY — PX: PORT-A-CATH REMOVAL: SHX5289

## 2017-07-07 SURGERY — REMOVAL PORT-A-CATH
Anesthesia: Monitor Anesthesia Care | Site: Chest | Laterality: Right

## 2017-07-07 MED ORDER — FENTANYL CITRATE (PF) 100 MCG/2ML IJ SOLN
INTRAMUSCULAR | Status: DC | PRN
  Administered 2017-07-07: 50 ug via INTRAVENOUS

## 2017-07-07 MED ORDER — LACTATED RINGERS IV SOLN
INTRAVENOUS | Status: DC
  Administered 2017-07-07: 06:00:00 via INTRAVENOUS

## 2017-07-07 MED ORDER — FENTANYL CITRATE (PF) 100 MCG/2ML IJ SOLN: 25.0000 ug | INTRAMUSCULAR | Status: DC | PRN

## 2017-07-07 MED ORDER — PROPOFOL 10 MG/ML IV BOLUS
INTRAVENOUS | Status: AC
  Filled 2017-07-07: qty 40

## 2017-07-07 MED ORDER — PROMETHAZINE HCL 25 MG/ML IJ SOLN: 6.2500 mg | INTRAMUSCULAR | Status: DC | PRN

## 2017-07-07 MED ORDER — MIDAZOLAM HCL 5 MG/5ML IJ SOLN
INTRAMUSCULAR | Status: DC | PRN
  Administered 2017-07-07: 2 mg via INTRAVENOUS

## 2017-07-07 MED ORDER — 0.9 % SODIUM CHLORIDE (POUR BTL) OPTIME
TOPICAL | Status: DC | PRN
  Administered 2017-07-07: 1000 mL

## 2017-07-07 MED ORDER — BUPIVACAINE-EPINEPHRINE (PF) 0.5% -1:200000 IJ SOLN
INTRAMUSCULAR | Status: AC
  Filled 2017-07-07: qty 30

## 2017-07-07 MED ORDER — MIDAZOLAM HCL 2 MG/2ML IJ SOLN
INTRAMUSCULAR | Status: AC
  Filled 2017-07-07: qty 2

## 2017-07-07 MED ORDER — BUPIVACAINE-EPINEPHRINE 0.5% -1:200000 IJ SOLN
INTRAMUSCULAR | Status: DC | PRN
  Administered 2017-07-07: 18 mL

## 2017-07-07 MED ORDER — PROPOFOL 500 MG/50ML IV EMUL
INTRAVENOUS | Status: DC | PRN
  Administered 2017-07-07: 125 ug/kg/min via INTRAVENOUS

## 2017-07-07 MED ORDER — PROPOFOL 500 MG/50ML IV EMUL
INTRAVENOUS | Status: DC | PRN
  Administered 2017-07-07: 30 mg via INTRAVENOUS

## 2017-07-07 MED ORDER — CEFAZOLIN SODIUM-DEXTROSE 2-4 GM/100ML-% IV SOLN
2.0000 g | INTRAVENOUS | Status: AC
  Administered 2017-07-07: 2 g via INTRAVENOUS
  Filled 2017-07-07: qty 100

## 2017-07-07 MED ORDER — ACETAMINOPHEN 500 MG PO TABS
1000.0000 mg | ORAL_TABLET | ORAL | Status: AC
  Administered 2017-07-07: 1000 mg via ORAL
  Filled 2017-07-07: qty 2

## 2017-07-07 MED ORDER — FENTANYL CITRATE (PF) 100 MCG/2ML IJ SOLN
INTRAMUSCULAR | Status: AC
  Filled 2017-07-07: qty 2

## 2017-07-07 MED ORDER — ONDANSETRON HCL 4 MG/2ML IJ SOLN
INTRAMUSCULAR | Status: DC | PRN
  Administered 2017-07-07: 4 mg via INTRAVENOUS

## 2017-07-07 SURGICAL SUPPLY — 29 items
BLADE SURG 15 STRL LF DISP TIS (BLADE) ×1 IMPLANT
BLADE SURG 15 STRL SS (BLADE) ×2
CHLORAPREP W/TINT 26ML (MISCELLANEOUS) ×3 IMPLANT
CONT SPEC 4OZ CLIKSEAL STRL BL (MISCELLANEOUS) ×3 IMPLANT
DECANTER SPIKE VIAL GLASS SM (MISCELLANEOUS) ×3 IMPLANT
DERMABOND ADVANCED (GAUZE/BANDAGES/DRESSINGS) ×2
DERMABOND ADVANCED .7 DNX12 (GAUZE/BANDAGES/DRESSINGS) ×1 IMPLANT
DRAPE LAPAROTOMY TRNSV 102X78 (DRAPE) ×3 IMPLANT
ELECT PENCIL ROCKER SW 15FT (MISCELLANEOUS) ×3 IMPLANT
ELECT REM PT RETURN 15FT ADLT (MISCELLANEOUS) ×3 IMPLANT
GAUZE 4X4 16PLY RFD (DISPOSABLE) ×3 IMPLANT
GAUZE SPONGE 4X4 12PLY STRL (GAUZE/BANDAGES/DRESSINGS) IMPLANT
GLOVE BIO SURGEON STRL SZ 6.5 (GLOVE) ×2 IMPLANT
GLOVE BIO SURGEONS STRL SZ 6.5 (GLOVE) ×1
GLOVE BIOGEL PI IND STRL 7.0 (GLOVE) ×1 IMPLANT
GLOVE BIOGEL PI INDICATOR 7.0 (GLOVE) ×2
GOWN STRL REUS W/TWL 2XL LVL3 (GOWN DISPOSABLE) ×3 IMPLANT
GOWN STRL REUS W/TWL XL LVL3 (GOWN DISPOSABLE) ×3 IMPLANT
KIT BASIN OR (CUSTOM PROCEDURE TRAY) ×3 IMPLANT
NEEDLE HYPO 25X1 1.5 SAFETY (NEEDLE) ×3 IMPLANT
PACK BASIC VI WITH GOWN DISP (CUSTOM PROCEDURE TRAY) ×3 IMPLANT
SUT VIC AB 2-0 SH 27 (SUTURE) ×2
SUT VIC AB 2-0 SH 27X BRD (SUTURE) ×1 IMPLANT
SUT VIC AB 3-0 SH 18 (SUTURE) ×3 IMPLANT
SUT VIC AB 4-0 PS2 18 (SUTURE) ×3 IMPLANT
SYR CONTROL 10ML LL (SYRINGE) ×3 IMPLANT
TOWEL OR 17X26 10 PK STRL BLUE (TOWEL DISPOSABLE) ×3 IMPLANT
TOWEL OR NON WOVEN STRL DISP B (DISPOSABLE) ×3 IMPLANT
YANKAUER SUCT BULB TIP 10FT TU (MISCELLANEOUS) IMPLANT

## 2017-07-07 NOTE — Anesthesia Procedure Notes (Signed)
Date/Time: 07/07/2017 7:23 AM Performed by: Glory Buff, CRNA Oxygen Delivery Method: Simple face mask

## 2017-07-07 NOTE — Anesthesia Preprocedure Evaluation (Addendum)
Anesthesia Evaluation  Patient identified by MRN, date of birth, ID band Patient awake    Reviewed: Allergy & Precautions, NPO status , Patient's Chart, lab work & pertinent test results  Airway Mallampati: II  TM Distance: >3 FB Neck ROM: Full    Dental no notable dental hx.    Pulmonary neg pulmonary ROS,    Pulmonary exam normal breath sounds clear to auscultation       Cardiovascular negative cardio ROS Normal cardiovascular exam Rhythm:Regular Rate:Normal     Neuro/Psych Seizures -, Well Controlled,  negative psych ROS   GI/Hepatic negative GI ROS, Neg liver ROS,   Endo/Other  negative endocrine ROS  Renal/GU negative Renal ROS  negative genitourinary   Musculoskeletal negative musculoskeletal ROS (+)   Abdominal   Peds negative pediatric ROS (+)  Hematology negative hematology ROS (+)   Anesthesia Other Findings   Reproductive/Obstetrics negative OB ROS                             Anesthesia Physical Anesthesia Plan  ASA: II  Anesthesia Plan: MAC   Post-op Pain Management:    Induction: Intravenous  PONV Risk Score and Plan: 1 and Ondansetron and Treatment may vary due to age or medical condition  Airway Management Planned: Simple Face Mask  Additional Equipment:   Intra-op Plan:   Post-operative Plan:   Informed Consent: I have reviewed the patients History and Physical, chart, labs and discussed the procedure including the risks, benefits and alternatives for the proposed anesthesia with the patient or authorized representative who has indicated his/her understanding and acceptance.   Dental advisory given  Plan Discussed with: CRNA and Surgeon  Anesthesia Plan Comments:         Anesthesia Quick Evaluation

## 2017-07-07 NOTE — Discharge Instructions (Addendum)
GENERAL SURGERY: POST OP INSTRUCTIONS  1. DIET: Follow a light bland diet the first 24 hours after arrival home, such as soup, liquids, crackers, etc.  Be sure to include lots of fluids daily.  Avoid fast food or heavy meals as your are more likely to get nauseated.   2. Take your usually prescribed home medications unless otherwise directed. 3. PAIN CONTROL: a. Pain is best controlled by a usual combination of three different methods TOGETHER: i. Ice/Heat ii. Over the counter pain medication b. Most patients will experience some swelling and bruising around the incisions.  Ice packs or heating pads (30-60 minutes up to 6 times a day) will help. Use ice for the first few days to help decrease swelling and bruising, then switch to heat to help relax tight/sore spots and speed recovery.  Some people prefer to use ice alone, heat alone, alternating between ice & heat.  Experiment to what works for you.  Swelling and bruising can take several weeks to resolve.   c. It is helpful to take an over-the-counter pain medication regularly for the first few weeks.  Choose one of the following that works best for you: i. Naproxen (Aleve, etc)  Two 220mg  tabs twice a day ii. Ibuprofen (Advil, etc) Three 200mg  tabs four times a day (every meal & bedtime) iii. Tylenol 650mg  every 4 hours  4. Avoid getting constipated.  Between the surgery and the pain medications, it is common to experience some constipation.  Increasing fluid intake and taking a fiber supplement (such as Metamucil, Citrucel, FiberCon, MiraLax, etc) 1-2 times a day regularly will usually help prevent this problem from occurring.  A mild laxative (prune juice, Milk of Magnesia, MiraLax, etc) should be taken according to package directions if there are no bowel movements after 48 hours.   5. Wash / shower every day.  You may shower over the dressings as they are waterproof.  Continue to shower over incision(s) after the dressing is off. 6. Your  dressing is waterproof and will wear off in 7-10 days.  7. ACTIVITIES as tolerated:   a. You may resume regular (light) daily activities beginning the next day--such as daily self-care, walking, climbing stairs--gradually increasing activities as tolerated.  If you can walk 30 minutes without difficulty, it is safe to try more intense activity such as jogging, treadmill, bicycling, low-impact aerobics, swimming, etc.  Avoid strenuous right arm activities for 48 hrs b. Save the most intensive and strenuous activity for last such as sit-ups, heavy lifting, contact sports, etc  Refrain from any heavy lifting or straining until you are off narcotics for pain control.   c. DO NOT PUSH THROUGH PAIN.  Let pain be your guide: If it hurts to do something, don't do it.  Pain is your body warning you to avoid that activity for another week until the pain goes down. d. You may drive when you are no longer taking prescription pain medication, you can comfortably wear a seatbelt, and you can safely maneuver your car and apply brakes. e. Dennis Bast may have sexual intercourse when it is comfortable.  8. FOLLOW UP in our office a. Please call [facility] at [phone] to set up an appointment to see your surgeon in the office for a follow-up appointment as needed after your surgery. b. Make sure that you call for this appointment the day you arrive home to insure a convenient appointment time. 9. IF YOU HAVE DISABILITY OR FAMILY LEAVE FORMS, BRING THEM TO THE OFFICE  FOR PROCESSING.  DO NOT GIVE THEM TO YOUR DOCTOR.   WHEN TO CALL us [phone]: 1. Poor pain control 2. Reactions / problems with new medications (rash/itching, nausea, etc)  3. Fever over 101.5 F (38.5 C) 4. Worsening swelling or bruising 5. Continued bleeding from incision. 6. Increased pain, redness, or drainage from the incision   The clinic staff is available to answer your questions during regular business hours (8:30am-5pm).  Please dont hesitate  to call and ask to speak to one of our nurses for clinical concerns.   If you have a medical emergency, go to the nearest emergency room or call 911.  A surgeon from [facility] is always on call at the [facility], [place], [place], [place], [place]  [address] ? MAIN: [phone] ? TOLL FREE: [phone] ?  FAX [phone] [email or web address]

## 2017-07-07 NOTE — H&P (Signed)
The patient is a 37 year old male who presented to the office with colorectal cancer. colonoscopy was performed. This showed a mass in his sigmoid or descending colon. Biopsy showed invasive adenocarcinoma. CEA level was 1.7. CT scan showed no signs of metastatic disease but several mesenteric lymph nodes were enlarged.  CT chest was unremarkable as well. Patient is now s/p sigmoidectomy and now adjuvant chemotherapy for stage 3 colon cancer.  He has no evidence of disease   Past Surgical History  Appendectomy Open Inguinal Hernia Surgery Right. Vasectomy  Diagnostic Studies History  Colonoscopy within last year  Allergies No Known Allergies 10/05/2016  Medication History  LamoTRIgine ER (200MG  Tablet ER 24HR, Oral) Active. Medications Reconciled  Social History  Alcohol use Occasional alcohol use. Caffeine use Carbonated beverages. No drug use Tobacco use Never smoker.  Family History  Respiratory Condition Father.  Other Problems  Colon Cancer Inguinal Hernia Seizure Disorder     Review of Systems  General Not Present- Appetite Loss, Chills, Fatigue, Fever, Night Sweats, Weight Gain and Weight Loss. Skin Not Present- Change in Wart/Mole, Dryness, Hives, Jaundice, New Lesions, Non-Healing Wounds, Rash and Ulcer. HEENT Not Present- Earache, Hearing Loss, Hoarseness, Nose Bleed, Oral Ulcers, Ringing in the Ears, Seasonal Allergies, Sinus Pain, Sore Throat, Visual Disturbances, Wears glasses/contact lenses and Yellow Eyes. Respiratory Not Present- Bloody sputum, Chronic Cough, Difficulty Breathing, Snoring and Wheezing. Breast Not Present- Breast Mass, Breast Pain, Nipple Discharge and Skin Changes. Cardiovascular Not Present- Chest Pain, Difficulty Breathing Lying Down, Leg Cramps, Palpitations, Rapid Heart Rate, Shortness of Breath and Swelling of Extremities. Gastrointestinal Present- Abdominal Pain and Bloody Stool. Not Present- Bloating,  Change in Bowel Habits, Chronic diarrhea, Constipation, Difficulty Swallowing, Excessive gas, Gets full quickly at meals, Hemorrhoids, Indigestion, Nausea, Rectal Pain and Vomiting. Male Genitourinary Not Present- Blood in Urine, Change in Urinary Stream, Frequency, Impotence, Nocturia, Painful Urination, Urgency and Urine Leakage. Musculoskeletal Not Present- Back Pain, Joint Pain, Joint Stiffness, Muscle Pain, Muscle Weakness and Swelling of Extremities. Neurological Not Present- Decreased Memory, Fainting, Headaches, Numbness, Seizures, Tingling, Tremor, Trouble walking and Weakness. Psychiatric Not Present- Anxiety, Bipolar, Change in Sleep Pattern, Depression, Fearful and Frequent crying. Endocrine Not Present- Cold Intolerance, Excessive Hunger, Hair Changes, Heat Intolerance, Hot flashes and New Diabetes. Hematology Not Present- Blood Thinners, Easy Bruising, Excessive bleeding, Gland problems, HIV and Persistent Infections.  BP 128/77   Pulse 64   Temp 98.6 F (37 C) (Oral)   Resp 16   Ht 6' (1.829 m)   Wt 80.9 kg (178 lb 6 oz)   SpO2 99%   BMI 24.19 kg/m    Physical Exam   General Mental Status-Alert. General Appearance-Not in acute distress. Build & Nutrition-Well nourished. Posture-Normal posture. Gait-Normal.  Head and Neck Head-normocephalic, atraumatic with no lesions or palpable masses. Trachea-midline.  Chest and Lung Exam Chest and lung exam reveals -on auscultation, normal breath sounds, no adventitious sounds and normal vocal resonance.  Cardiovascular Cardiovascular examination reveals -normal heart sounds, regular rate and rhythm with no murmurs and no digital clubbing, cyanosis, edema, increased warmth or tenderness.  Abdomen Inspection Inspection of the abdomen reveals - No Hernias. Palpation/Percussion Palpation and Percussion of the abdomen reveal - Soft, Non Tender, No Rigidity (guarding) and No hepatosplenomegaly. Abdominal  Mass Palpable - Location - Left Lower Quadrant.  Neurologic Neurologic evaluation reveals -alert and oriented x 3 with no impairment of recent or remote memory, normal attention span and ability to concentrate, normal sensation and normal coordination.  Musculoskeletal  Normal Exam - Bilateral-Upper Extremity Strength Normal and Lower Extremity Strength Normal.    Assessment & Plan   CANCER OF DESCENDING COLON (C18.6) Impression: s/p IV chemotherapy.  He is no longer in need of his port and would like this removed.  Risks including bleeding and infection have been explained.  All questions were answered.

## 2017-07-07 NOTE — Op Note (Signed)
07/07/2017  8:14 AM  PATIENT:  Andres Wallace  37 y.o. male  Patient Care Team: [name], MD as PCP - General (Family Medicine) Nehemiah Settle, MD (Gastroenterology) Leighton Ruff, MD as Consulting Physician (General Surgery) Truitt Merle, MD as Consulting Physician (Hematology)  PRE-OPERATIVE DIAGNOSIS:  Chemo Completion  POST-OPERATIVE DIAGNOSIS:  Chemo Completion  PROCEDURE:   REMOVAL PORT-A-CATH    Surgeon(s): Leighton Ruff, MD  ASSISTANT: none   ANESTHESIA:   local and MAC  EBL:  No intake/output data recorded.  DRAINS: none   SPECIMEN:  Source of Specimen:  port  DISPOSITION OF SPECIMEN:  PATHOLOGY  COUNTS:  YES  PLAN OF CARE: Discharge to home after PACU  PATIENT DISPOSITION:  PACU - hemodynamically stable.  INDICATION: 37 year old male status post completion adjuvant chemotherapy for stage III colon cancer.  He is here today for port removal.   OR FINDINGS: Port in place, normal-appearing anatomy.  DESCRIPTION: the patient was identified in the preoperative holding area and taken to the OR where they were laid supine on the operating room table.  MAC anesthesia was induced without difficulty. SCDs were also noted to be in place prior to the initiation of anesthesia.  The patient was then prepped and draped in the usual sterile fashion.   A surgical timeout was performed indicating the correct patient, procedure, positioning and need for preoperative antibiotics.   I began by placing a field block with Marcaine mixed with epinephrine.  We then made an incision over the previous scar.  Dissection was carried down to the level of the port using a scalpel and Bovie electrocautery.  The 2 Prolene sutures were identified and removed.  The port was then removed from the pocket.  A 2-0 Vicryl pursestring suture was placed at the opening of the catheter site and the catheter was then removed and the pursestring suture was tied behind this.  The capsule and  subcutaneous tissue was then closed with a 3-0 Vicryl running suture.  The skin was then closed with a running 4-0 subcuticular suture.  Dermabond was placed over this.  The patient was then awakened from anesthesia and sent to the postanesthesia care unit in stable condition.  All counts were correct per operating room staff.

## 2017-07-07 NOTE — Transfer of Care (Signed)
Immediate Anesthesia Transfer of Care Note  Patient: Andres Wallace  Procedure(s) Performed: REMOVAL PORT-A-CATH (Right Chest)  Patient Location: PACU  Anesthesia Type:MAC  Level of Consciousness: awake, alert  and oriented  Airway & Oxygen Therapy: Patient Spontanous Breathing and Patient connected to face mask oxygen  Post-op Assessment: Report given to RN and Post -op Vital signs reviewed and stable  Post vital signs: Reviewed and stable  Last Vitals:  Vitals Value Taken Time  BP    Temp    Pulse 77 07/07/2017  8:24 AM  Resp 11 07/07/2017  8:24 AM  SpO2 100 % 07/07/2017  8:24 AM  Vitals shown include unvalidated device data.  Last Pain:  Vitals:   07/07/17 0553  TempSrc: Oral         Complications: No apparent anesthesia complications

## 2017-07-08 NOTE — Anesthesia Postprocedure Evaluation (Signed)
Anesthesia Post Note  Patient: IMMANUEL FEDAK  Procedure(s) Performed: REMOVAL PORT-A-CATH (Right Chest)     Patient location during evaluation: PACU Anesthesia Type: MAC Level of consciousness: awake and alert Pain management: pain level controlled Vital Signs Assessment: post-procedure vital signs reviewed and stable Respiratory status: spontaneous breathing, nonlabored ventilation, respiratory function stable and patient connected to nasal cannula oxygen Cardiovascular status: stable and blood pressure returned to baseline Postop Assessment: no apparent nausea or vomiting Anesthetic complications: no    Last Vitals:  Vitals:   07/07/17 0845 07/07/17 0900  BP: 123/72 133/81  Pulse: (!) 56 61  Resp: 13 14  Temp:  36.5 C  SpO2: 99% 100%    Last Pain:  Vitals:   07/07/17 0900  TempSrc:   PainSc: 0-No pain                 Tyler Cubit S

## 2017-09-21 NOTE — Progress Notes (Signed)
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 Melina [facility]: There is an obviously malignant mass at 28 cm in the sigmoid colon. He was circumferential and the colonoscopy could not be passed through the lesions. Multiple biopsies taken.      10/01/2016 Initial Biopsy    Sigmoid colon mass biopsy showed invasive adenocarcinoma.      10/04/2016 Imaging    CT abdomen/pelvis - no signs of metastatic disease, several enlarged mesenteric lymph nodes      10/13/2016 Imaging    CT chest, abdomen and the pelvis with contrast and showed no evidence of distant recurrence.       10/21/2016 Genetic Testing    The patient had genetic testing due to a personal history of colon cancer at 71.  He was tested for the Common Hereditary Cancer Panel.  The Hereditary Gene Panel offered by [facility] includes sequencing and/or deletion duplication testing of the following 46 genes: APC, ATM, AXIN2, BARD1, BMPR1A, BRCA1, BRCA2, BRIP1, CDH1, CDKN2A (p14ARF), CDKN2A (p16INK4a), CHEK2, CTNNA1, DICER1, EPCAM (Deletion/duplication testing only), GREM1 (promoter region deletion/duplication testing only), KIT, MEN1, MLH1, MSH2, MSH3,  MSH6, MUTYH, NBN, NF1, NHTL1, PALB2, PDGFRA, PMS2, POLD1, POLE, PTEN, RAD50, RAD51C, RAD51D, SDHB, SDHC, SDHD, SMAD4, SMARCA4. STK11, TP53, TSC1, TSC2, and VHL.  The following genes were evaluated for sequence changes only: SDHA and HOXB13 c.251G>A variant only.  Results: No pathogenic mutations identified.  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  The date of this test report is 10/21/2016.       11/10/2016 Pathology Results    Diagnosis- surgical pathology Colon, segmental resection for tumor, rectosigmoid ADENOCARCINOMA OF THE COLON, GRADE 3 (4.5 CM) THE TUMOR INVADES THROUGH THE MUSCULARIS PROPRIA INTO PERICOLONIC SOFT TISSUE (PT3) LYMPHOVASCULAR INVASION IS IDENTIFIED MARGINS OF RESECTION ARE NEGATIVE FOR CARCINOMA METASTATIC ADENOCARCINOMA IN FOUR OF TWENTY-TWO LYMPH NODES (4/22, PN2A)      11/10/2016 [facility]     [facility]MOID[facility]  SURGEON:  Surgeon(s): Andres Boston, MD Andres Ruff, MD       3/50/0938 Miscellaneous    Colon cancer MSI-stable       12/06/2016 - 05/23/2017 Chemotherapy    CAPOX every 3 weeks for 6 months       06/24/2017 Imaging    CT CAP W Contrast 06/24/17 IMPRESSION: 1. Status post surgical resection of a sigmoid colon cancer. No CT findings suspicious for residual tumor, recurrent tumor, regional adenopathy or distant metastatic disease. 2. No acute abdominal findings.       HISTORY OF PRESENTING ILLNESS:  Andres [facility] 37 y.o. male is here because of colorectal cancer. He presents today with his wife,  Andres [facility]. He was referred by his surgeon Dr. Marcello [facility] for his stage III colon cancer.   Pt reports that 12-14 years ago he was diagnosed with stomach ulcers. He also had a confirmation scope later which confirmed this.  Initially, he presented to his gastroenterologist (Dr Andres [facility]) complaining of a 1-2 month history of rectal bleeding which he describes as maroon colored blood. He does  note that over the Spring or early Summer that he experienced some bright-red blood as well. He also had developed some cramping abdominal pain throughout this time as well with his maroon colored stools. He was on an antibiotic at that time for a non-cancerous mole removal which he thought was the source of his pain. His pain resolved after these antibiotics, but he reports that it later returned several weeks ago. He had a DRE performed which did confirm the presence of blood and a colonoscopy was subsequently also performed at that time which revealed mass of the sigmoid or descending colon. He had biopsy performed of this which showed invasive adenocarcinoma. His CEA level around this time was 1.7. On 10/04/16 he had a CT A/P performed which showed no signs of metastatic disease, but it did show several mesenteric lymph nodes which were enlarged. The next day on 10/05/16 he had follow-up with Dr [facility] of [facility] who recommended a left hemicolectomy in a minimally invasive fashion. He was also referred to genetics and scheduled to have a CT Chest performed to complete his metastatic workup. On 11/10/16 he underwent left hemicolectomy which was performed without complication. He recovered from this well during his admission and was subsequently discharge on 11/12/16. His genetic screening was performed and was normal. He does not have a FHx of colon cancer. 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 He has one son and two daughters who are all young. His wife currently is out of work to take care of their children and the home, she previously worked as a Marine scientist for [employer].   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   [facility] presents today for consult. Overall, he is doing well. He has  been recovering from his [facility] well. He notes that if he twists his abdomen too quickly  he will have some pain surrounding his incisional site, but otherwise this has not really bothered him. He also notes that he has lost approximately 10-12lbs due to some appetite issues. His appetite has been returning slowly, however.  His energy levels have been good overall. He denies nausea, vomiting, changes in bowel habits, or any other associated symptoms.   CURRENT THERAPY: Surveillance  INTERVAL HISTORY:   Andres [facility] is here for a follow up for colon cancer. He presents to the clinic today by himself. He notes mild residual neuropathy in fingertips and some in his feet. This does not effect his sleep and he is able to manage with socks on. His fatigue has resolved. His colonoscopy is 09/30/17. He notes his BMs are normal. He notes he is back to baseline overall.      MEDICAL HISTORY:  Past Medical History:  Diagnosis Date  . Colon cancer ([place])   . Family history of lung cancer   . Right inguinal hernia   . Seizures ([place])  3 seizures 2016    SURGICAL HISTORY: Past Surgical History:  Procedure Laterality Date  . APPENDECTOMY  2006  . COLON [facility]     for colon cancer  . HERNIA REPAIR    . INGUINAL HERNIA REPAIR  10/15/2011   Procedure: HERNIA REPAIR INGUINAL ADULT;  Surgeon: Odis Hollingshead, MD;  Location: [facility];  Service: General;  Laterality: Right;  with mesh   . left knee arthroscopy    . PORT-A-CATH REMOVAL Right 07/07/2017   Procedure: REMOVAL PORT-A-CATH;  Surgeon: Andres Ruff, MD;  Location: [facility];  Service: General;  Laterality: Right;  . PORTACATH PLACEMENT Right 12/01/2016   Procedure: INSERTION PORT-A-CATH;  Surgeon: Andres Ruff, MD;  Location: [facility];  Service: General;  Laterality: Right;  . PROCTOSCOPY  11/10/2016   Procedure: PROCTOSCOPY;  Surgeon: Andres Ruff, MD;  Location: [facility];  Service: General;;  . VASECTOMY  10/15/2011   Procedure: VASECTOMY;   Surgeon: Claybon Jabs, MD;  Location: [facility];  Service: Urology;  Laterality: N/A;    SOCIAL HISTORY: Social History   Socioeconomic History  . Marital status: Married    Spouse name: Not on file  . Number of children: Not on file  . Years of education: Not on file  . Highest education level: Not on file  Occupational History  . Not on file  Social Needs  . Financial resource strain: Not on file  . Food insecurity:    Worry: Not on file    Inability: Not on file  . Transportation needs:    Medical: Not on file    Non-medical: Not on file  Tobacco Use  . Smoking status: Never Smoker  . Smokeless tobacco: Never Used  Substance and Sexual Activity  . Alcohol use: Not Currently    Comment: rarely  . Drug use: No  . Sexual activity: Yes  Lifestyle  . Physical activity:    Days per week: Not on file    Minutes per session: Not on file  . Stress: Not on file  Relationships  . Social connections:    Talks on phone: Not on file    Gets together: Not on file    Attends religious service: Not on file    Active member of club or organization: Not on file    Attends meetings of clubs or organizations: Not on file    Relationship status: Not on file  . Intimate partner violence:    Fear of current or ex partner: Not on file    Emotionally abused: Not on file    Physically abused: Not on file    Forced sexual activity: Not on file  Other Topics Concern  . Not on file  Social History Narrative  . Not on file    FAMILY HISTORY: Family History  Problem Relation Age of Onset  . Lung cancer Father 73  . Lung cancer Paternal Grandfather        dx in 3's, died at 80  . Heart attack Maternal Grandmother 58    ALLERGIES:  has No Known Allergies.  MEDICATIONS:  Current Outpatient Medications  Medication Sig Dispense Refill  . LamoTRIgine XR 200 MG TB24 Take 400 mg by mouth at bedtime.  5  . MILK THISTLE PO Take 1 tablet by mouth 2 (two) times daily.    . Omega-3 Fatty  Acids (FISH OIL PO) Take 2 capsules by mouth daily.    Marland Kitchen OVER THE COUNTER MEDICATION Take 2 capsules by mouth  at bedtime. [place] TAIL MUSHROOM SUPPLEMENT    . Tragacanth (ASTRAGALUS ROOT) POWD Take 2 tablets by mouth at bedtime.    . TURMERIC PO Take 1 capsule by mouth 2 (two) times daily.    Marland Kitchen dexamethasone (DECADRON) 4 MG tablet Take 2 tablets (8 mg total) by mouth daily. Start the day after chemotherapy for 2 days. Take with food. (Patient not taking: Reported on 05/02/2017) 30 tablet 1  . lidocaine-prilocaine (EMLA) cream Apply 1 application topically as needed. (Patient not taking: Reported on 07/06/2017) 30 g 2   No current facility-administered medications for this visit.     REVIEW OF SYSTEMS:   Constitutional: Denies fevers, chills or abnormal night sweats.  Eyes: Denies blurriness of vision, double vision or watery eyes Ears, nose, mouth, throat, and face: Denies mucositis or sore throat Respiratory: Denies cough, dyspnea or wheezes Cardiovascular: Denies palpitation, chest discomfort or lower extremity swelling Gastrointestinal:  Denies nausea, heartburn or change in bowel habits Skin: Denies abnormal skin rashes   Lymphatics: Denies Andres lymphadenopathy or easy bruising Neurological: Denies Andres weaknesses (+) mild residual peripheral neuropathy, mostly in feet Behavioral/Psych: Mood is stable, no Andres changes  All other systems were reviewed with the patient and are negative.  PHYSICAL EXAMINATION: ECOG PERFORMANCE STATUS: 0 Vitals:   09/26/17 0941  BP: (!) 143/70  Pulse: 66  Resp: 18  Temp: 97.9 F (36.6 C)  SpO2: 100%     GENERAL:alert, no distress and comfortable SKIN: skin color, texture, turgor are normal, no rashes or significant lesions EYES: normal, conjunctiva are pink and non-injected, sclera clear OROPHARYNX:no exudate, no erythema and lips, buccal mucosa, and tongue normal  NECK: supple, thyroid normal size, non-tender, without nodularity LYMPH:  no palpable  lymphadenopathy in the cervical, axillary or inguinal LUNGS: clear to auscultation and percussion with normal breathing effort HEART: regular rate & rhythm and no murmurs and no lower extremity edema ABDOMEN:abdomen soft, non-tender and normal bowel sounds. Multiple small surgical incisions have healed well. No discharge or surrounding skin erythema. No organomegaly.  Musculoskeletal:no cyanosis of digits and no clubbing  PSYCH: alert & oriented x 3 with fluent speech NEURO: no focal motor/sensory deficits  LABORATORY DATA:  I have reviewed the data as listed CBC Latest Ref Rng & Units 09/26/2017 06/24/2017 05/02/2017  WBC 4.0 - 10.3 K/uL 6.0 5.3 5.1  Hemoglobin 13.0 - 17.1 g/dL 15.0 14.0 12.4(L)  Hematocrit 38.4 - 49.9 % 44.0 39.7 34.7(L)  Platelets 140 - 400 K/uL 161 155 135(L)   CMP Latest Ref Rng & Units 09/26/2017 06/24/2017 05/02/2017  Glucose 70 - 99 mg/dL 68(L) 93 94  BUN 6 - 20 mg/dL 20 16 12   Creatinine 0.61 - 1.24 mg/dL 1.05 1.14 0.93  Sodium 135 - 145 mmol/L 141 141 140  Potassium 3.5 - 5.1 mmol/L 4.3 4.5 3.8  Chloride 98 - 111 mmol/L 104 106 107  CO2 22 - 32 mmol/L 30 26 26   Calcium 8.9 - 10.3 mg/dL 9.9 9.8 9.7  Total Protein 6.5 - 8.1 g/dL 7.3 7.0 6.7  Total Bilirubin 0.3 - 1.2 mg/dL 0.6 0.7 0.8  Alkaline Phos 38 - 126 U/L 84 97 105  AST 15 - 41 U/L 26 37(H) 47(H)  ALT 0 - 44 U/L 31 35 46    PATHOLOGY RESULTS:   Diagnosis 11/10/16 Colon, segmental resection for tumor, rectosigmoid ADENOCARCINOMA OF THE COLON, GRADE 3 (4.5 CM) THE TUMOR INVADES THROUGH THE MUSCULARIS PROPRIA INTO PERICOLONIC SOFT TISSUE (PT3) LYMPHOVASCULAR INVASION IS IDENTIFIED MARGINS  OF RESECTION ARE NEGATIVE FOR CARCINOMA METASTATIC ADENOCARCINOMA IN FOUR OF TWENTY-TWO LYMPH NODES (4/22, PN2A)  Microscopic Comment COLON AND RECTUM (INCLUDING TRANS-ANAL RESECTION): Specimen: Rectosigmoid colon Procedure: Segmental resection Tumor site: descending colon Specimen integrity: Intact Macroscopic  intactness of mesorectum: Not applicable: NA Complete: x Near complete: NA Incomplete: NA Cannot be determined (specify): NA Macroscopic tumor perforation: through the muscularis propria into the subserosal adipose tissue Invasive tumor: Maximum size: 4.5 cm Histologic type(s): Adenocarcinoma Histologic grade and differentiation: G1: well differentiated/low grade G2: moderately differentiated/low grade G3: poorly differentiated/high grade G4: undifferentiated/high grade Type of polyp in which invasive carcinoma arose: Tubular adenoma Microscopic extension of invasive tumor: The tumor invades through muscularis propria into the subserosal adiposetissue Lymph-Vascular invasion: Identified Peri-neural invasion: Negative Tumor deposit(s) (discontinuous extramural extension): negative Resection margins: Proximal margin: negative Distal margin: Negative Circumferential (radial) (posterior ascending, posterior descending; lateral and posterior mid-rectum; and entire lower 1/3 rectum):negative Mesenteric margin (sigmoid and transverse): Negative Distance closest margin (if all above margins negative): 0.1 cm to radial margin Trans-anal resection margins only: Deep margin: NA Mucosal Margin: NA Distance closest mucosal margin (if negative): NA Treatment effect (neo-adjuvant therapy): Negative Additional polyp(s): Negative Non-neoplastic findings: Unremarkable Lymph nodes: number examined 22; number positive: 4 Pathologic Staging: pT3, pN2a, pMx Ancillary studies: ordered  RADIOGRAPHIC STUDIES: I have personally reviewed the radiological images as listed and agreed with the findings in the report.  CT CAP W Contrast 06/24/17 IMPRESSION: 1. Status post surgical resection of a sigmoid colon cancer. No CT findings suspicious for residual tumor, recurrent tumor, regional adenopathy or distant metastatic disease. 2. No acute abdominal findings.  No results found.  ASSESSMENT: 37  y.o.  Caucasian male with PMH of seizure, presented with recurrent rectal bleeding   1. Left colon cancer of the sigmoid colon, invasive adenocarcinoma, G3,  pT3N2M0, stage IIIB, MSI stable -I previously reviewed his CT scan findings, and surgical pathology results in great details with patient and his wife. -He had a complete surgical resection with negative margins, the tumor was T3 lesion and 4/22 nodes were positive.  -Staging scan was negative for distant metastasis. -Due to the high-risk of recurrence, I previously recommended adjuvant chemotherapy CAPOX for 6 months, 12/06/16-05/23/17. He tolerated very well and completed the full course  -He had port removal on 07/07/17 by Dr. Marcello [facility] -CT CAP W Contrast from 06/24/17 revealed no findings suspicious for residual tumor, recurrent tumor, regional adenopathy or distant metastatic disease. I discussed these results with the pt and his wife. They are pleased.  -Patient did not have a full colonoscopy at the diagnosis, he will proceed with post treatment colonoscopy on 09/30/17.   -I discussed the surveillance plan, which is a physical exam and lab test every 3-4 months for the first 2-3 years, then every 6-12 months, and surveillance CT scan every 6-12 month for up to 5 year.  -He is clinically doing well, asymptomatic, lab reviewed, exam was unremarkable today.  There is no clinical concern for recurrence. -Next surveillance CT scan in 11/2017.  -I encouraged him to watch for Andres concerning symptoms for cancer recurrence. I recommend he stay up to date with yearly vaccines and maintain healthy diet, exercise and stay positive to strengthen his immune system.  -F/u in 3 months with scan before    2. History of seizure  -Continue Lamictal and a follow-up with his neurologist at [facility].  3.  Peripheral neuropathy, G1 -He had cold sensitivity secondary to chemotherapy -He has some mild numbness on  fingers and toes, no impact on his functions -Improved,  residual neuropathy in feet mostly. Continue to monitor.   6.  Transaminitis  -Secondary to chemotherapy, overall mild and stable, will monitor closely. - resolved now   PLAN: -f/u in 3 months with lab and CT CAP with contrast a few days before   Orders Placed This Encounter  Procedures  . CT Abdomen Pelvis W Contrast    Standing Status:   Future    Standing Expiration Date:   09/26/2018    Order Specific Question:   If indicated for the ordered procedure, I authorize the administration of contrast media per Radiology protocol    Answer:   Yes    Order Specific Question:   Preferred imaging location?    Answer:   [facility]    Order Specific Question:   Is Oral Contrast requested for this exam?    Answer:   Yes, Per Radiology protocol    Order Specific Question:   Radiology Contrast Protocol - do NOT remove file path    Answer:   [email or web address]  . CT Chest W Contrast    Standing Status:   Future    Standing Expiration Date:   09/26/2018    Order Specific Question:   If indicated for the ordered procedure, I authorize the administration of contrast media per Radiology protocol    Answer:   Yes    Order Specific Question:   Preferred imaging location?    Answer:   [facility]    Order Specific Question:   Radiology Contrast Protocol - do NOT remove file path    Answer:   [email or web address]   I, Joslyn Devon, am acting as scribe for Andres Merle, MD.   I have reviewed the above documentation for accuracy and completeness, and I agree with the above.     Andres Merle, MD 09/26/2017 11:42 AM

## 2017-09-26 ENCOUNTER — Telehealth: Payer: Self-pay | Admitting: Hematology

## 2017-09-26 ENCOUNTER — Inpatient Hospital Stay (HOSPITAL_BASED_OUTPATIENT_CLINIC_OR_DEPARTMENT_OTHER): Payer: No Typology Code available for payment source | Admitting: Hematology

## 2017-09-26 ENCOUNTER — Encounter: Payer: Self-pay | Admitting: Hematology

## 2017-09-26 ENCOUNTER — Inpatient Hospital Stay: Payer: No Typology Code available for payment source | Attending: Hematology

## 2017-09-26 VITALS — BP 143/70 | HR 66 | Temp 97.9°F | Resp 18 | Ht 72.0 in | Wt 184.9 lb

## 2017-09-26 DIAGNOSIS — G629 Polyneuropathy, unspecified: Secondary | ICD-10-CM

## 2017-09-26 DIAGNOSIS — Z8711 Personal history of peptic ulcer disease: Secondary | ICD-10-CM | POA: Insufficient documentation

## 2017-09-26 DIAGNOSIS — C772 Secondary and unspecified malignant neoplasm of intra-abdominal lymph nodes: Secondary | ICD-10-CM | POA: Diagnosis not present

## 2017-09-26 DIAGNOSIS — C187 Malignant neoplasm of sigmoid colon: Secondary | ICD-10-CM | POA: Diagnosis not present

## 2017-09-26 DIAGNOSIS — G40909 Epilepsy, unspecified, not intractable, without status epilepticus: Secondary | ICD-10-CM

## 2017-09-26 DIAGNOSIS — Z801 Family history of malignant neoplasm of trachea, bronchus and lung: Secondary | ICD-10-CM

## 2017-09-26 DIAGNOSIS — Z79899 Other long term (current) drug therapy: Secondary | ICD-10-CM | POA: Insufficient documentation

## 2017-09-26 DIAGNOSIS — Z9221 Personal history of antineoplastic chemotherapy: Secondary | ICD-10-CM

## 2017-09-26 LAB — COMPREHENSIVE METABOLIC PANEL
ALBUMIN: 4.4 g/dL (ref 3.5–5.0)
ALK PHOS: 84 U/L (ref 38–126)
ALT: 31 U/L (ref 0–44)
ANION GAP: 7 (ref 5–15)
AST: 26 U/L (ref 15–41)
BUN: 20 mg/dL (ref 6–20)
CALCIUM: 9.9 mg/dL (ref 8.9–10.3)
CO2: 30 mmol/L (ref 22–32)
Chloride: 104 mmol/L (ref 98–111)
Creatinine, Ser: 1.05 mg/dL (ref 0.61–1.24)
GFR calc Af Amer: >60 mL/min (ref >60)
GFR calc non Af Amer: >60 mL/min (ref >60)
GLUCOSE: 68 mg/dL — AB (ref 70–99)
Potassium: 4.3 mmol/L (ref 3.5–5.1)
Sodium: 141 mmol/L (ref 135–145)
Total Bilirubin: 0.6 mg/dL (ref 0.3–1.2)
Total Protein: 7.3 g/dL (ref 6.5–8.1)

## 2017-09-26 LAB — CBC WITH DIFFERENTIAL/PLATELET
BASOS PCT: 1 %
Basophils Absolute: 0 10*3/uL (ref 0.0–0.1)
Eosinophils Absolute: 0.1 10*3/uL (ref 0.0–0.5)
Eosinophils Relative: 2 %
HEMATOCRIT: 44 % (ref 38.4–49.9)
Hemoglobin: 15 g/dL (ref 13.0–17.1)
Lymphocytes Relative: 32 %
Lymphs Abs: 1.9 10*3/uL (ref 0.9–3.3)
MCH: 31.5 pg (ref 27.2–33.4)
MCHC: 34.1 g/dL (ref 32.0–36.0)
MCV: 92.4 fL (ref 79.3–98.0)
MONO ABS: 0.4 10*3/uL (ref 0.1–0.9)
Monocytes Relative: 7 %
NEUTROS ABS: 3.5 10*3/uL (ref 1.5–6.5)
Neutrophils Relative %: 58 %
Platelets: 161 10*3/uL (ref 140–400)
RBC: 4.76 MIL/uL (ref 4.20–5.82)
RDW: 12.5 % (ref 11.0–14.6)
WBC: 6 10*3/uL (ref 4.0–10.3)

## 2017-09-26 NOTE — Telephone Encounter (Signed)
Scheduled appt per 7/29 los - pt aware of appts no print out wanted per patient - central radiology to contact patient with ct scan .

## 2018-01-09 ENCOUNTER — Other Ambulatory Visit: Payer: No Typology Code available for payment source

## 2018-01-11 ENCOUNTER — Ambulatory Visit: Payer: No Typology Code available for payment source | Admitting: Hematology

## 2018-01-16 ENCOUNTER — Inpatient Hospital Stay: Payer: No Typology Code available for payment source | Attending: Hematology

## 2018-01-16 ENCOUNTER — Ambulatory Visit (HOSPITAL_COMMUNITY)
Admission: RE | Admit: 2018-01-16 | Discharge: 2018-01-16 | Disposition: A | Discharge status: Home / Self Care | Payer: No Typology Code available for payment source | Source: Ambulatory Visit | Attending: Hematology | Admitting: Hematology

## 2018-01-16 ENCOUNTER — Encounter (HOSPITAL_COMMUNITY): Payer: Self-pay

## 2018-01-16 DIAGNOSIS — C187 Malignant neoplasm of sigmoid colon: Secondary | ICD-10-CM | POA: Insufficient documentation

## 2018-01-16 DIAGNOSIS — C772 Secondary and unspecified malignant neoplasm of intra-abdominal lymph nodes: Secondary | ICD-10-CM | POA: Diagnosis not present

## 2018-01-16 DIAGNOSIS — Z801 Family history of malignant neoplasm of trachea, bronchus and lung: Secondary | ICD-10-CM | POA: Diagnosis not present

## 2018-01-16 DIAGNOSIS — K769 Liver disease, unspecified: Secondary | ICD-10-CM | POA: Diagnosis not present

## 2018-01-16 DIAGNOSIS — G40909 Epilepsy, unspecified, not intractable, without status epilepticus: Secondary | ICD-10-CM | POA: Diagnosis not present

## 2018-01-16 DIAGNOSIS — Z79899 Other long term (current) drug therapy: Secondary | ICD-10-CM | POA: Insufficient documentation

## 2018-01-16 DIAGNOSIS — R630 Anorexia: Secondary | ICD-10-CM | POA: Diagnosis not present

## 2018-01-16 DIAGNOSIS — G629 Polyneuropathy, unspecified: Secondary | ICD-10-CM | POA: Insufficient documentation

## 2018-01-16 DIAGNOSIS — Z9221 Personal history of antineoplastic chemotherapy: Secondary | ICD-10-CM | POA: Diagnosis not present

## 2018-01-16 DIAGNOSIS — R634 Abnormal weight loss: Secondary | ICD-10-CM | POA: Insufficient documentation

## 2018-01-16 LAB — CBC WITH DIFFERENTIAL/PLATELET
ABS IMMATURE GRANULOCYTES: 0.02 10*3/uL (ref 0.00–0.07)
Basophils Absolute: 0 10*3/uL (ref 0.0–0.1)
Basophils Relative: 0 %
Eosinophils Absolute: 0.1 10*3/uL (ref 0.0–0.5)
Eosinophils Relative: 2 %
HCT: 42.9 % (ref 39.0–52.0)
HEMOGLOBIN: 14.9 g/dL (ref 13.0–17.0)
IMMATURE GRANULOCYTES: 0 %
LYMPHS PCT: 35 %
Lymphs Abs: 2.2 10*3/uL (ref 0.7–4.0)
MCH: 32.2 pg (ref 26.0–34.0)
MCHC: 34.7 g/dL (ref 30.0–36.0)
MCV: 92.7 fL (ref 80.0–100.0)
MONO ABS: 0.6 10*3/uL (ref 0.1–1.0)
MONOS PCT: 10 %
NEUTROS ABS: 3.4 10*3/uL (ref 1.7–7.7)
NEUTROS PCT: 53 %
Platelets: 157 10*3/uL (ref 150–400)
RBC: 4.63 MIL/uL (ref 4.22–5.81)
RDW: 11.8 % (ref 11.5–15.5)
WBC: 6.3 10*3/uL (ref 4.0–10.5)
nRBC: 0 % (ref 0.0–0.2)

## 2018-01-16 LAB — COMPREHENSIVE METABOLIC PANEL
ALK PHOS: 80 U/L (ref 38–126)
ALT: 58 U/L — AB (ref 0–44)
AST: 108 U/L — AB (ref 15–41)
Albumin: 4 g/dL (ref 3.5–5.0)
Anion gap: 10 (ref 5–15)
BUN: 13 mg/dL (ref 6–20)
CALCIUM: 9.5 mg/dL (ref 8.9–10.3)
CHLORIDE: 105 mmol/L (ref 98–111)
CO2: 28 mmol/L (ref 22–32)
CREATININE: 1.12 mg/dL (ref 0.61–1.24)
GFR calc Af Amer: >60 mL/min (ref >60)
Glucose, Bld: 86 mg/dL (ref 70–99)
Potassium: 4.4 mmol/L (ref 3.5–5.1)
SODIUM: 143 mmol/L (ref 135–145)
Total Bilirubin: 0.6 mg/dL (ref 0.3–1.2)
Total Protein: 7.1 g/dL (ref 6.5–8.1)

## 2018-01-16 LAB — CEA (IN HOUSE-CHCC): CEA (CHCC-In House): 1.6 ng/mL (ref 0.00–5.00)

## 2018-01-16 MED ORDER — SODIUM CHLORIDE (PF) 0.9 % IJ SOLN
INTRAMUSCULAR | Status: AC
  Filled 2018-01-16: qty 50

## 2018-01-16 MED ORDER — IOHEXOL 300 MG/ML  SOLN
100.0000 mL | Freq: Once | INTRAMUSCULAR | Status: AC | PRN
  Administered 2018-01-16: 100 mL via INTRAVENOUS

## 2018-01-17 NOTE — Progress Notes (Signed)
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 Melina Copa: There is an obviously malignant mass at 28 cm in the sigmoid colon. He was circumferential and the colonoscopy could not be passed through the lesions. Multiple biopsies taken.    10/01/2016 Initial Biopsy    Sigmoid colon mass biopsy showed invasive adenocarcinoma.    10/04/2016 Imaging    CT abdomen/pelvis - no signs of metastatic disease, several enlarged mesenteric lymph nodes    10/13/2016 Imaging    CT chest, abdomen and the pelvis with contrast and showed no evidence of distant recurrence.     10/21/2016 Genetic Testing    The patient had genetic testing due to a personal history of colon cancer at 63.  He was tested for the Common Hereditary Cancer Panel.  The Hereditary Gene Panel offered by [facility] includes sequencing and/or deletion duplication testing of the following 46 genes: APC, ATM, AXIN2, BARD1, BMPR1A, BRCA1, BRCA2, BRIP1, CDH1, CDKN2A (p14ARF), CDKN2A (p16INK4a), CHEK2, CTNNA1, DICER1, EPCAM (Deletion/duplication testing only), GREM1 (promoter region deletion/duplication testing only), KIT, MEN1, MLH1, MSH2, MSH3, MSH6, MUTYH, NBN, NF1,  NHTL1, PALB2, PDGFRA, PMS2, POLD1, POLE, PTEN, RAD50, RAD51C, RAD51D, SDHB, SDHC, SDHD, SMAD4, SMARCA4. STK11, TP53, TSC1, TSC2, and VHL.  The following genes were evaluated for sequence changes only: SDHA and HOXB13 c.251G>A variant only.  Results: No pathogenic mutations identified.  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  The date of this test report is 10/21/2016.     11/10/2016 Pathology Results    Diagnosis- surgical pathology Colon, segmental resection for tumor, rectosigmoid ADENOCARCINOMA OF THE COLON, GRADE 3 (4.5 CM) THE TUMOR INVADES THROUGH THE MUSCULARIS [place] INTO PERICOLONIC SOFT TISSUE (PT3) LYMPHOVASCULAR INVASION IS IDENTIFIED MARGINS OF RESECTION ARE NEGATIVE FOR CARCINOMA METASTATIC ADENOCARCINOMA IN FOUR OF TWENTY-TWO LYMPH NODES (4/22, PN2A)    11/10/2016 Surgery     XI ROBOT ASSISTED SIGMOIDECTOMY  SURGEON:  Surgeon(s): Hilman Boston, MD Leighton Ruff, MD     06/10/8784 Miscellaneous    Colon cancer MSI-stable     12/06/2016 - 05/23/2017 Chemotherapy    CAPOX every 3 weeks for 6 months     06/24/2017 Imaging    CT CAP W Contrast 06/24/17 IMPRESSION: 1. Status post surgical resection of a sigmoid colon cancer. No CT findings suspicious for residual tumor, recurrent tumor, regional adenopathy or distant metastatic disease. 2. No acute abdominal findings.    09/30/2017 Procedure    Colonoscopy -Inflamed hyperplastic polyp -No dysplasia or malignancy    01/16/2018 Imaging    01/16/2018 CT CAP IMPRESSION: 1. Questionable new low-attenuation focus in the posterior right liver dome. This may be artifactual due to contrast timing. MRI abdomen  without and with IV contrast recommended to exclude a true liver lesion. 2. No additional potential findings of metastatic disease in the chest, abdomen or pelvis. 3. No evidence of recurrent tumor at the distal colonic anastomosis.     HISTORY OF PRESENTING  ILLNESS:  Andres Wallace 37 y.o. male is here because of colorectal cancer. He presents today with his wife, Andres Wallace. He was referred by his surgeon Dr. Marcello Moores for his stage III colon cancer.   Pt reports that 12-14 years ago he was diagnosed with stomach ulcers. He also had a confirmation scope later which confirmed this.  Initially, he presented to his gastroenterologist (Dr Nehemiah Settle) complaining of a 1-2 month history of rectal bleeding which he describes as maroon colored blood. He does note that over the Spring or early Summer that he experienced some bright-red blood as well. He also had developed some cramping abdominal pain throughout this time as well with his maroon colored stools. He was on an antibiotic at that time for a non-cancerous mole removal which he thought was the source of his pain. His pain resolved after these antibiotics, but he reports that it later returned several weeks ago. He had a DRE performed which did confirm the presence of blood and a colonoscopy was subsequently also performed at that time which revealed mass of the sigmoid or descending colon. He had biopsy performed of this which showed invasive adenocarcinoma. His CEA level around this time was 1.7. On 10/04/16 he had a CT A/P performed which showed no signs of metastatic disease, but it did show several mesenteric lymph nodes which were enlarged. The next day on 10/05/16 he had follow-up with Dr Marcello Moores of [facility] who recommended a left hemicolectomy in a minimally invasive fashion. He was also referred to genetics and scheduled to have a CT Chest performed to complete his metastatic workup. On 11/10/16 he underwent left hemicolectomy which was performed without complication. He recovered from this well during his admission and was subsequently discharge on 11/12/16. His genetic screening was performed and was normal. He does not have a FHx of colon cancer. 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 He has one son and two daughters who are all young. His wife currently is out of work to take care of their children and the home, she previously worked as a Marine scientist for [employer].   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   Andres Wallace presents today for consult. Overall, he is doing well. He has been recovering from his surgery well. He notes that if he twists his abdomen too quickly  he will have some pain surrounding his incisional site, but otherwise this has not really bothered him. He also notes that he has lost approximately 10-12lbs due to some appetite issues. His appetite has been returning slowly, however.  His energy levels have been good overall. He denies nausea, vomiting, changes in bowel habits, or any other associated symptoms.   CURRENT THERAPY: Surveillance  INTERVAL HISTORY:   EILAM SHREWSBURY is here for a follow up for colon cancer. Since our last visit in July, he had a colonoscopy that was negative for malignancy, and a CT CAP 2 days ago.   Today, he is here with his wife.  He feels good and states that his energy level is back to normal and is busy at work. He denies abdominal pain or bloating, bowel movements normal, no constipation, diarrhea, hematochezia or melena.  He has good appetite, weight is stable. He denies alcohol consumption.      MEDICAL HISTORY:  Past Medical History:  Diagnosis Date  . Colon cancer ([place]) dx'd 09/2016  . Family history of lung cancer   . Right inguinal hernia   . Seizures ([place])    3 seizures 2016    SURGICAL HISTORY: Past Surgical History:  Procedure Laterality Date  . APPENDECTOMY  2006  . COLON SURGERY     for colon cancer  . HERNIA REPAIR    . INGUINAL HERNIA REPAIR  10/15/2011   Procedure: HERNIA REPAIR INGUINAL ADULT;  Surgeon:  Odis Hollingshead, MD;  Location: [facility];  Service: General;  Laterality: Right;  with mesh   . left knee arthroscopy    . PORT-A-CATH REMOVAL Right 07/07/2017   Procedure: REMOVAL PORT-A-CATH;  Surgeon: Leighton Ruff, MD;  Location: [facility];  Service: General;  Laterality: Right;  . PORTACATH PLACEMENT Right 12/01/2016   Procedure: INSERTION PORT-A-CATH;  Surgeon: Leighton Ruff, MD;  Location: [facility];  Service: General;  Laterality: Right;  . PROCTOSCOPY  11/10/2016   Procedure: PROCTOSCOPY;  Surgeon: Leighton Ruff, MD;  Location: [facility];  Service: General;;  . VASECTOMY  10/15/2011   Procedure: VASECTOMY;  Surgeon: Claybon Jabs, MD;  Location: [facility];  Service: Urology;  Laterality: N/A;    SOCIAL HISTORY: Social History   Socioeconomic History  . Marital status: Married    Spouse name: Not on file  . Number of children: Not on file  . Years of education: Not on file  . Highest education level: Not on file  Occupational History  . Not on file  Social Needs  . Financial resource strain: Not on file  . Food insecurity:    Worry: Not on file    Inability: Not on file  . Transportation needs:    Medical: Not on file    Non-medical: Not on file  Tobacco Use  . Smoking status: Never Smoker  . Smokeless tobacco: Never Used  Substance and Sexual Activity  . Alcohol use: Not Currently    Comment: rarely  . Drug use: No  . Sexual activity: Yes  Lifestyle  . Physical activity:    Days per week: Not on file    Minutes per session: Not on file  . Stress: Not on file  Relationships  . Social connections:    Talks on phone: Not on file    Gets together: Not on file    Attends religious service: Not on file    Active member of club or organization: Not on file    Attends meetings of clubs or organizations: Not on file    Relationship status: Not on file  . Intimate partner violence:    Fear of current or ex partner: Not on file    Emotionally abused: Not on file    Physically  abused: Not on file    Forced sexual activity: Not on file  Other Topics Concern  . Not on file  Social History Narrative  . Not on file    FAMILY HISTORY: Family History  Problem Relation Age of Onset  . Lung cancer Father 28  . Lung cancer Paternal Grandfather        dx in 36's, died at  66  . Heart attack Maternal Grandmother 58    ALLERGIES:  has No Known Allergies.  MEDICATIONS:  Current Outpatient Medications  Medication Sig Dispense Refill  . LamoTRIgine XR 200 MG TB24 Take 400 mg by mouth at bedtime.  5  . lidocaine-prilocaine (EMLA) cream Apply 1 application topically as needed. 30 g 2  . MILK THISTLE PO Take 1 tablet by mouth 2 (two) times daily.    . Omega-3 Fatty Acids (FISH OIL PO) Take 2 capsules by mouth daily.    Marland Kitchen OVER THE COUNTER MEDICATION Take 2 capsules by mouth at bedtime. [place] TAIL MUSHROOM SUPPLEMENT    . Tragacanth (ASTRAGALUS ROOT) POWD Take 2 tablets by mouth at bedtime.    . TURMERIC PO Take 1 capsule by mouth 2 (two) times daily.     No current facility-administered medications for this visit.     REVIEW OF SYSTEMS:   Constitutional: Denies fevers, chills or abnormal night sweats.  Eyes: Denies blurriness of vision, double vision or watery eyes Ears, nose, mouth, throat, and face: Denies mucositis or sore throat Respiratory: Denies cough, dyspnea or wheezes Cardiovascular: Denies palpitation, chest discomfort or lower extremity swelling Gastrointestinal:  Denies nausea, heartburn or change in bowel habits Skin: Denies abnormal skin rashes   Lymphatics: Denies new lymphadenopathy or easy bruising Neurological: Denies new weaknesses, numbness or tingling Behavioral/Psych: Mood is stable, no new changes  All other systems were reviewed with the patient and are negative.  PHYSICAL EXAMINATION: ECOG PERFORMANCE STATUS: 0 Vitals:   01/18/18 0850  BP: 139/74  Pulse: 76  Resp: 18  Temp: 98 F (36.7 C)  SpO2: 96%     GENERAL:alert, no  distress and comfortable SKIN: skin color, texture, turgor are normal, no rashes or significant lesions EYES: normal, conjunctiva are pink and non-injected, sclera clear OROPHARYNX:no exudate, no erythema and lips, buccal mucosa, and tongue normal  NECK: supple, thyroid normal size, non-tender, without nodularity LYMPH:  no palpable lymphadenopathy in the cervical, axillary or inguinal LUNGS: clear to auscultation and percussion with normal breathing effort HEART: regular rate & rhythm and no murmurs and no lower extremity edema ABDOMEN:abdomen soft, non-tender and normal bowel sounds. Multiple small surgical incisions have healed well. No discharge or surrounding skin erythema. No organomegaly.  Musculoskeletal:no cyanosis of digits and no clubbing  PSYCH: alert & oriented x 3 with fluent speech NEURO: no focal motor/sensory deficits  LABORATORY DATA:  I have reviewed the data as listed CBC Latest Ref Rng & Units 01/16/2018 09/26/2017 06/24/2017  WBC 4.0 - 10.5 K/uL 6.3 6.0 5.3  Hemoglobin 13.0 - 17.0 g/dL 14.9 15.0 14.0  Hematocrit 39.0 - 52.0 % 42.9 44.0 39.7  Platelets 150 - 400 K/uL 157 161 155   CMP Latest Ref Rng & Units 01/16/2018 09/26/2017 06/24/2017  Glucose 70 - 99 mg/dL 86 68(L) 93  BUN 6 - 20 mg/dL 13 20 16   Creatinine 0.61 - 1.24 mg/dL 1.12 1.05 1.14  Sodium 135 - 145 mmol/L 143 141 141  Potassium 3.5 - 5.1 mmol/L 4.4 4.3 4.5  Chloride 98 - 111 mmol/L 105 104 106  CO2 22 - 32 mmol/L 28 30 26   Calcium 8.9 - 10.3 mg/dL 9.5 9.9 9.8  Total Protein 6.5 - 8.1 g/dL 7.1 7.3 7.0  Total Bilirubin 0.3 - 1.2 mg/dL 0.6 0.6 0.7  Alkaline Phos 38 - 126 U/L 80 84 97  AST 15 - 41 U/L 108(H) 26 37(H)  ALT 0 - 44 U/L 58(H) 31 35  Tumor Marker CEA 10/01/2016: 1.7 01/16/18: 1.60   PATHOLOGY RESULTS:   09/30/2017 Colonoscopy   Diagnosis 11/10/16 Colon, segmental resection for tumor, rectosigmoid ADENOCARCINOMA OF THE COLON, GRADE 3 (4.5 CM) THE TUMOR INVADES THROUGH THE MUSCULARIS  [place] INTO PERICOLONIC SOFT TISSUE (PT3) LYMPHOVASCULAR INVASION IS IDENTIFIED MARGINS OF RESECTION ARE NEGATIVE FOR CARCINOMA METASTATIC ADENOCARCINOMA IN FOUR OF TWENTY-TWO LYMPH NODES (4/22, PN2A)  Microscopic Comment COLON AND RECTUM (INCLUDING TRANS-ANAL RESECTION): Specimen: Rectosigmoid colon Procedure: Segmental resection Tumor site: descending colon Specimen integrity: Intact Macroscopic intactness of mesorectum: Not applicable: NA Complete: x Near complete: NA Incomplete: NA Cannot be determined (specify): NA Macroscopic tumor perforation: through the muscularis propria into the subserosal adipose tissue Invasive tumor: Maximum size: 4.5 cm Histologic type(s): Adenocarcinoma Histologic grade and differentiation: G1: well differentiated/low grade G2: moderately differentiated/low grade G3: poorly differentiated/high grade G4: undifferentiated/high grade Type of polyp in which invasive carcinoma arose: Tubular adenoma Microscopic extension of invasive tumor: The tumor invades through muscularis propria into the subserosal adiposetissue Lymph-Vascular invasion: Identified Peri-neural invasion: Negative Tumor deposit(s) (discontinuous extramural extension): negative Resection margins: Proximal margin: negative Distal margin: Negative Circumferential (radial) (posterior ascending, posterior descending; lateral and posterior mid-rectum; and entire lower 1/3 rectum):negative Mesenteric margin (sigmoid and transverse): Negative Distance closest margin (if all above margins negative): 0.1 cm to radial margin Trans-anal resection margins only: Deep margin: NA Mucosal Margin: NA Distance closest mucosal margin (if negative): NA Treatment effect (neo-adjuvant therapy): Negative Additional polyp(s): Negative Non-neoplastic findings: Unremarkable Lymph nodes: number examined 22; number positive: 4 Pathologic Staging: pT3, pN2a, pMx Ancillary studies:  ordered  RADIOGRAPHIC STUDIES: I have personally reviewed the radiological images as listed and agreed with the findings in the report.  01/16/2018 CT CAP IMPRESSION: 1. Questionable new low-attenuation focus in the posterior right liver dome. This may be artifactual due to contrast timing. MRI abdomen without and with IV contrast recommended to exclude a true liver lesion. 2. No additional potential findings of metastatic disease in the chest, abdomen or pelvis. 3. No evidence of recurrent tumor at the distal colonic anastomosis.  CT CAP W Contrast 06/24/17 IMPRESSION: 1. Status post surgical resection of a sigmoid colon cancer. No CT findings suspicious for residual tumor, recurrent tumor, regional adenopathy or distant metastatic disease. 2. No acute abdominal findings.   ASSESSMENT:  37 y.o.  Caucasian male with PMH of seizure, presented with recurrent rectal bleeding   1. Left colon cancer, sigmoid colon, invasive adenocarcinoma, G3,  pT3N2M0, stage IIIB, MSI stable -I previously reviewed his CT scan findings, and surgical pathology results in great details with patient and his wife. -He had a complete surgical resection with negative margins, the tumor was T3 lesion and 4/22 nodes were positive.  -Staging scan was negative for distant metastasis. -Due to the high-risk of recurrence, I previously recommended adjuvant chemotherapy CAPOX for 6 months, 12/06/16-05/23/17. He tolerated very well and completed the full course  -He had port removal on 07/07/17 by Dr. Marcello Moores -CT CAP W Contrast from 06/24/17 revealed no findings suspicious for residual tumor, recurrent tumor, regional adenopathy or distant metastatic disease. I discussed these results with the pt and his wife. They are pleased.  --He is clinically doing well, asymptomatic. -I reviewed the images of his CT scan from 01/16/2018 in person with pt and his wife. It unfortunately showed a 2.8 cm new hypodense lesion in the right  lobe of liver, indeterminate, metastasis is possible. I recommend a liver MRI scan for further evaluation. If it looks suspicious on  MRI, we will need to biopsy the lesion. -I briefly reviewed treatment options for metastatic colon cancer, including surgery and chemotherapy. We discussed that if we confirmed it's liver metastasis, given the oligo lesion, he has good change to cure.  -Lab reviewed, he has mildly elevated liver enzymes, total bilirubin normal, he is asymptomatic, will continue monitoring. - Colonoscopy done on 09/30/2017 was negative for malignancy -F/u after MRI   2. History of seizure  -Continue Lamictal and a follow-[facility].  3.  Peripheral neuropathy, G1 -He had cold sensitivity secondary to chemotherapy -He has some mild numbness on fingers and toes, no impact on his functions -much improved, residual neuropathy in feet mostly. Continue to monitor.   6.  Transaminitis  -He had intermittent transaminitis secondary to chemotherapy, overall mild and stable, will monitor closely. -His liver enzymes are elevated again today, will continue monitoring  PLAN: -abdomen MRI w wo contrast ASAP, if liver lesion is suspicious for metastasis, will get liver biopsy  -f/u after MRI    Orders Placed This Encounter  Procedures  . Andres Abdomen W Wo Contrast    Standing Status:   Future    Standing Expiration Date:   03/21/2019    Order Specific Question:   If indicated for the ordered procedure, I authorize the administration of contrast media per Radiology protocol    Answer:   Yes    Order Specific Question:   What is the patient's sedation requirement?    Answer:   No Sedation    Order Specific Question:   Does the patient have a pacemaker or implanted devices?    Answer:   No    Order Specific Question:   Radiology Contrast Protocol - do NOT remove file path    Answer:   [email or web address]    Order Specific Question:   Preferred  imaging location?    Answer:   [facility] (table limit-350 lbs)   I, Noor Dweik am acting as scribe for Dr. Truitt Merle.  I spent a total of 30 mins for his visit today, >50% of face-to-face counseling.  I have reviewed the above documentation for accuracy and completeness, and I agree with the above.     Truitt Merle, MD 01/18/2018

## 2018-01-18 ENCOUNTER — Inpatient Hospital Stay (HOSPITAL_BASED_OUTPATIENT_CLINIC_OR_DEPARTMENT_OTHER): Payer: No Typology Code available for payment source | Admitting: Hematology

## 2018-01-18 ENCOUNTER — Encounter: Payer: Self-pay | Admitting: Hematology

## 2018-01-18 VITALS — BP 139/74 | HR 76 | Temp 98.0°F | Resp 18 | Ht 72.0 in | Wt 186.4 lb

## 2018-01-18 DIAGNOSIS — R634 Abnormal weight loss: Secondary | ICD-10-CM

## 2018-01-18 DIAGNOSIS — G629 Polyneuropathy, unspecified: Secondary | ICD-10-CM

## 2018-01-18 DIAGNOSIS — Z79899 Other long term (current) drug therapy: Secondary | ICD-10-CM

## 2018-01-18 DIAGNOSIS — C187 Malignant neoplasm of sigmoid colon: Secondary | ICD-10-CM

## 2018-01-18 DIAGNOSIS — Z9221 Personal history of antineoplastic chemotherapy: Secondary | ICD-10-CM

## 2018-01-18 DIAGNOSIS — K769 Liver disease, unspecified: Secondary | ICD-10-CM

## 2018-01-18 DIAGNOSIS — G40909 Epilepsy, unspecified, not intractable, without status epilepticus: Secondary | ICD-10-CM

## 2018-01-18 DIAGNOSIS — C772 Secondary and unspecified malignant neoplasm of intra-abdominal lymph nodes: Secondary | ICD-10-CM | POA: Diagnosis not present

## 2018-01-18 DIAGNOSIS — Z801 Family history of malignant neoplasm of trachea, bronchus and lung: Secondary | ICD-10-CM

## 2018-01-18 DIAGNOSIS — R630 Anorexia: Secondary | ICD-10-CM

## 2018-01-20 ENCOUNTER — Telehealth: Payer: Self-pay | Admitting: Hematology

## 2018-01-20 NOTE — Telephone Encounter (Signed)
Per 11/20 no los °

## 2018-01-24 ENCOUNTER — Ambulatory Visit (HOSPITAL_COMMUNITY)
Admission: RE | Admit: 2018-01-24 | Discharge: 2018-01-24 | Disposition: A | Discharge status: Home / Self Care | Payer: No Typology Code available for payment source | Source: Ambulatory Visit | Attending: Hematology | Admitting: Hematology

## 2018-01-24 ENCOUNTER — Encounter: Payer: Self-pay | Admitting: Hematology

## 2018-01-24 DIAGNOSIS — C772 Secondary and unspecified malignant neoplasm of intra-abdominal lymph nodes: Secondary | ICD-10-CM | POA: Diagnosis present

## 2018-01-24 DIAGNOSIS — C187 Malignant neoplasm of sigmoid colon: Secondary | ICD-10-CM | POA: Diagnosis present

## 2018-01-24 MED ORDER — GADOBUTROL 1 MMOL/ML IV SOLN
8.0000 mL | Freq: Once | INTRAVENOUS | Status: AC | PRN
  Administered 2018-01-24: 8 mL via INTRAVENOUS

## 2018-04-28 NOTE — Progress Notes (Signed)
[place]   Telephone:([phone] Fax:(336) [phone]   Clinic Follow up Note   Patient Care Team: [name], MD as PCP - General (Family Medicine) Nehemiah Settle, MD (Gastroenterology) Leighton Ruff, MD as Consulting Physician (General Surgery) Truitt Merle, MD as Consulting Physician (Hematology)  Date of Service:  05/01/2018  CHIEF COMPLAINT: F/u of left colon cancer  SUMMARY OF ONCOLOGIC HISTORY: Oncology History   Cancer Staging Colon cancer [facility]) Staging form: Colon and Rectum, AJCC 8th Edition - Pathologic stage from 11/10/2016: Stage IIIB (pT3, pN2a, cM0) - Signed by Truitt Merle, MD on 11/22/2016       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 Melina Copa: There is an obviously malignant mass at 28 cm in the sigmoid colon. He was circumferential and the colonoscopy could not be passed through the lesions. Multiple biopsies taken.    10/01/2016 Initial Biopsy    Sigmoid colon mass biopsy showed invasive adenocarcinoma.    10/04/2016 Imaging    CT abdomen/pelvis - no signs of metastatic disease, several enlarged mesenteric lymph nodes    10/13/2016 Imaging    CT chest, abdomen and the pelvis with contrast and showed no evidence of distant recurrence.     10/21/2016 Genetic Testing    The patient had genetic testing due to a personal history of colon cancer at 89.  He was tested for the Common Hereditary Cancer Panel.  The Hereditary Gene Panel offered by [facility] includes sequencing and/or deletion duplication testing of the following 46 genes: APC, ATM, AXIN2, BARD1, BMPR1A, BRCA1, BRCA2, BRIP1, CDH1, CDKN2A (p14ARF), CDKN2A (p16INK4a), CHEK2, CTNNA1, DICER1, EPCAM (Deletion/duplication testing only), GREM1 (promoter region deletion/duplication testing only), KIT, MEN1, MLH1, MSH2, MSH3, MSH6, MUTYH, NBN, NF1, NHTL1, PALB2, PDGFRA, PMS2,  POLD1, POLE, PTEN, RAD50, RAD51C, RAD51D, SDHB, SDHC, SDHD, SMAD4, SMARCA4. STK11, TP53, TSC1, TSC2, and VHL.  The following genes were evaluated for sequence changes only: SDHA and HOXB13 c.251G>A variant only.  Results: No pathogenic mutations identified.  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  The date of this test report is 10/21/2016.     11/10/2016 Pathology Results    Diagnosis- surgical pathology Colon, segmental resection for tumor, rectosigmoid ADENOCARCINOMA OF THE COLON, GRADE 3 (4.5 CM) THE TUMOR INVADES THROUGH THE MUSCULARIS PROPRIA INTO PERICOLONIC SOFT TISSUE (PT3) LYMPHOVASCULAR INVASION IS IDENTIFIED MARGINS OF RESECTION ARE NEGATIVE FOR CARCINOMA METASTATIC ADENOCARCINOMA IN FOUR OF TWENTY-TWO LYMPH NODES (4/22, PN2A)    11/10/2016 Surgery     XI ROBOT ASSISTED SIGMOIDECTOMY  SURGEON:  Surgeon(s): Jarryd Boston, MD Leighton Ruff, MD     2/29/7989 Miscellaneous    Colon cancer MSI-stable     12/06/2016 - 05/23/2017 Chemotherapy    CAPOX every 3 weeks for 6 months     06/24/2017 Imaging    CT CAP W Contrast 06/24/17 IMPRESSION: 1. Status post surgical resection of a sigmoid colon cancer. No CT findings suspicious for residual tumor, recurrent tumor, regional adenopathy or distant metastatic disease. 2. No acute abdominal findings.    09/30/2017 Procedure    Colonoscopy -Inflamed hyperplastic polyp -No dysplasia or malignancy    01/16/2018 Imaging    01/16/2018 CT CAP IMPRESSION: 1. Questionable new low-attenuation focus in the posterior right liver dome. This may be artifactual due to contrast timing. MRI abdomen without and with IV  contrast recommended to exclude a true liver lesion. 2. No additional potential findings of metastatic disease in the chest, abdomen or pelvis. 3. No evidence of recurrent tumor at the distal colonic anastomosis.      CURRENT THERAPY:  Surveillance    INTERVAL HISTORY:    Andres Wallace is here for a follow up of colon cancer. He presents to the clinic today by himself. He notes he is doing well. He denies any concerns.  He notes he recently started a crossfit gym and that is going well. He denies bloody or black stool or GI issues.  He notes his initial scans with diagnosis showed fatty liver, but no scan afterward shows evidence of fatty liver.      REVIEW OF SYSTEMS:   Constitutional: Denies fevers, chills or abnormal weight loss Eyes: Denies blurriness of vision Ears, nose, mouth, throat, and face: Denies mucositis or sore throat Respiratory: Denies cough, dyspnea or wheezes Cardiovascular: Denies palpitation, chest discomfort or lower extremity swelling Gastrointestinal:  Denies nausea, heartburn or change in bowel habits Skin: Denies abnormal skin rashes Lymphatics: Denies new lymphadenopathy or easy bruising Neurological:Denies numbness, tingling or new weaknesses Behavioral/Psych: Mood is stable, no new changes  All other systems were reviewed with the patient and are negative.  MEDICAL HISTORY:  Past Medical History:  Diagnosis Date  . Colon cancer ([place]) dx'd 09/2016  . Family history of lung cancer   . Right inguinal hernia   . Seizures ([place])    3 seizures 2016    SURGICAL HISTORY: Past Surgical History:  Procedure Laterality Date  . APPENDECTOMY  2006  . COLON SURGERY     for colon cancer  . HERNIA REPAIR    . INGUINAL HERNIA REPAIR  10/15/2011   Procedure: HERNIA REPAIR INGUINAL ADULT;  Surgeon: Odis Hollingshead, MD;  Location: [facility];  Service: General;  Laterality: Right;  with mesh   . left knee arthroscopy    . PORT-A-CATH REMOVAL Right 07/07/2017   Procedure: REMOVAL PORT-A-CATH;  Surgeon: Leighton Ruff, MD;  Location: [facility];  Service: General;  Laterality: Right;  . PORTACATH PLACEMENT Right 12/01/2016   Procedure: INSERTION PORT-A-CATH;  Surgeon: Leighton Ruff, MD;  Location: [facility];  Service: General;  Laterality:  Right;  . PROCTOSCOPY  11/10/2016   Procedure: PROCTOSCOPY;  Surgeon: Leighton Ruff, MD;  Location: [facility];  Service: General;;  . VASECTOMY  10/15/2011   Procedure: VASECTOMY;  Surgeon: Claybon Jabs, MD;  Location: [facility];  Service: Urology;  Laterality: N/A;    I have reviewed the social history and family history with the patient and they are unchanged from previous note.  ALLERGIES:  has No Known Allergies.  MEDICATIONS:  Current Outpatient Medications  Medication Sig Dispense Refill  . LamoTRIgine XR 200 MG TB24 Take 100 mg by mouth at bedtime. Pt states trying to wean off  5  . MILK THISTLE PO Take 1 tablet by mouth 2 (two) times daily.    . Omega-3 Fatty Acids (FISH OIL PO) Take 2 capsules by mouth daily.    Marland Kitchen OVER THE COUNTER MEDICATION Take 2 capsules by mouth at bedtime. [place] TAIL MUSHROOM SUPPLEMENT    . Tragacanth (ASTRAGALUS ROOT) POWD Take 2 tablets by mouth at bedtime.    . TURMERIC PO Take 1 capsule by mouth 2 (two) times daily.     No current facility-administered medications for this visit.     PHYSICAL EXAMINATION: ECOG PERFORMANCE STATUS: 0 - Asymptomatic  Vitals:  05/01/18 0915  BP: (!) 148/78  Pulse: 76  Resp: 18  Temp: 98.2 F (36.8 C)  SpO2: 100%   Filed Weights   05/01/18 0915  Weight: 186 lb 8 oz (84.6 kg)    GENERAL:alert, no distress and comfortable SKIN: skin color, texture, turgor are normal, no rashes or significant lesions EYES: normal, Conjunctiva are pink and non-injected, sclera clear OROPHARYNX:no exudate, no erythema and lips, buccal mucosa, and tongue normal  NECK: supple, thyroid normal size, non-tender, without nodularity LYMPH:  no palpable lymphadenopathy in the cervical, axillary or inguinal LUNGS: clear to auscultation and percussion with normal breathing effort HEART: regular rate & rhythm and no murmurs and no lower extremity edema ABDOMEN:abdomen soft, non-tender and normal bowel sounds Musculoskeletal:no cyanosis of  digits and no clubbing  NEURO: alert & oriented x 3 with fluent speech, no focal motor/sensory deficits  LABORATORY DATA:  I have reviewed the data as listed CBC Latest Ref Rng & Units 05/01/2018 01/16/2018 09/26/2017  WBC 4.0 - 10.5 K/uL 9.1 6.3 6.0  Hemoglobin 13.0 - 17.0 g/dL 14.7 14.9 15.0  Hematocrit 39.0 - 52.0 % 42.7 42.9 44.0  Platelets 150 - 400 K/uL 156 157 161     CMP Latest Ref Rng & Units 05/01/2018 01/16/2018 09/26/2017  Glucose 70 - 99 mg/dL 72 86 68(L)  BUN 6 - 20 mg/dL 15 13 20   Creatinine 0.61 - 1.24 mg/dL 1.11 1.12 1.05  Sodium 135 - 145 mmol/L 142 143 141  Potassium 3.5 - 5.1 mmol/L 3.9 4.4 4.3  Chloride 98 - 111 mmol/L 105 105 104  CO2 22 - 32 mmol/L 28 28 30   Calcium 8.9 - 10.3 mg/dL 9.4 9.5 9.9  Total Protein 6.5 - 8.1 g/dL 7.1 7.1 7.3  Total Bilirubin 0.3 - 1.2 mg/dL 0.5 0.6 0.6  Alkaline Phos 38 - 126 U/L 74 80 84  AST 15 - 41 U/L 30 108(H) 26  ALT 0 - 44 U/L 27 58(H) 31      RADIOGRAPHIC STUDIES: I have personally reviewed the radiological images as listed and agreed with the findings in the report. No results found.   ASSESSMENT & PLAN:  Andres Wallace is a 38 y.o. male with   1. Left colon cancer, sigmoid colon, invasive adenocarcinoma, G3,  pT3N2M0, stage IIIB, MSI stable -He was diagnosed in 09/2016. He is s/p complete surgical resection with negative margins and adjuvant CAPOX.  surveillance Colonoscopy done on 09/30/2017 was negative for malignancy.  -Last abdominal MRI in 12/2017 shows no evidence of metastasis or malignancy.  -He is clinically doing well. Labs reviewed, CBC and CMP WNL. CEA is still pending. Physical exam was unremarkable. There is no clinical concern for recurrence.  -I encouraged him to remain active, continue heathy diet and drink adequate amount of water.  -We discussed cancer surveillance. Risk of recurrence reduces significantly after the first 2-3 years. Will continue to follow him for a total of 5 years with follow ups,  labs and physical exam. Will repeat CT scan in 6 months  -F/u in 3 months    2. History of seizure  -Continue Lamictal and a follow-up with his neurologist at [facility].  3. Peripheral neuropathy, G1 -He had cold sensitivity secondary to chemotherapy -He has some mild numbness on fingers and toes, no impact on his functions -continue to improve   6. Transaminitis  -He had intermittent transaminitis secondary to chemotherapy, resolved after chemo, recurred again on last visit.  -Lab reviewed, liver functions normal today. -  We discussed the possibility of transaminitis, possibly related to previous chemo, medicine/supplement or fatty liver, he does not drink alcohol   PLAN: Lab and f/u in 3 months, will order scan on next visit    No problem-specific Assessment & Plan notes found for this encounter.   No orders of the defined types were placed in this encounter.  All questions were answered. The patient knows to call the clinic with any problems, questions or concerns. No barriers to learning was detected. I spent 15 minutes counseling the patient face to face. The total time spent in the appointment was 20 minutes and more than 50% was on counseling and review of test results     Truitt Merle, MD 05/01/2018   I, Joslyn Devon, am acting as scribe for Truitt Merle, MD.   I have reviewed the above documentation for accuracy and completeness, and I agree with the above.

## 2018-05-01 ENCOUNTER — Telehealth: Payer: Self-pay | Admitting: Hematology

## 2018-05-01 ENCOUNTER — Other Ambulatory Visit: Payer: Self-pay

## 2018-05-01 ENCOUNTER — Inpatient Hospital Stay: Payer: No Typology Code available for payment source | Attending: Hematology

## 2018-05-01 ENCOUNTER — Inpatient Hospital Stay (HOSPITAL_BASED_OUTPATIENT_CLINIC_OR_DEPARTMENT_OTHER): Payer: No Typology Code available for payment source | Admitting: Hematology

## 2018-05-01 VITALS — BP 148/78 | HR 76 | Temp 98.2°F | Resp 18 | Ht 72.0 in | Wt 186.5 lb

## 2018-05-01 DIAGNOSIS — G629 Polyneuropathy, unspecified: Secondary | ICD-10-CM | POA: Insufficient documentation

## 2018-05-01 DIAGNOSIS — Z9221 Personal history of antineoplastic chemotherapy: Secondary | ICD-10-CM | POA: Diagnosis not present

## 2018-05-01 DIAGNOSIS — Z79899 Other long term (current) drug therapy: Secondary | ICD-10-CM

## 2018-05-01 DIAGNOSIS — G40909 Epilepsy, unspecified, not intractable, without status epilepticus: Secondary | ICD-10-CM

## 2018-05-01 DIAGNOSIS — C187 Malignant neoplasm of sigmoid colon: Secondary | ICD-10-CM

## 2018-05-01 DIAGNOSIS — K760 Fatty (change of) liver, not elsewhere classified: Secondary | ICD-10-CM | POA: Insufficient documentation

## 2018-05-01 DIAGNOSIS — C772 Secondary and unspecified malignant neoplasm of intra-abdominal lymph nodes: Secondary | ICD-10-CM | POA: Diagnosis not present

## 2018-05-01 LAB — CBC WITH DIFFERENTIAL/PLATELET
Abs Immature Granulocytes: 0.03 10*3/uL (ref 0.00–0.07)
Basophils Absolute: 0 10*3/uL (ref 0.0–0.1)
Basophils Relative: 0 %
EOS PCT: 1 %
Eosinophils Absolute: 0.1 10*3/uL (ref 0.0–0.5)
HEMATOCRIT: 42.7 % (ref 39.0–52.0)
HEMOGLOBIN: 14.7 g/dL (ref 13.0–17.0)
Immature Granulocytes: 0 %
LYMPHS ABS: 2.2 10*3/uL (ref 0.7–4.0)
LYMPHS PCT: 24 %
MCH: 31.6 pg (ref 26.0–34.0)
MCHC: 34.4 g/dL (ref 30.0–36.0)
MCV: 91.8 fL (ref 80.0–100.0)
MONO ABS: 0.5 10*3/uL (ref 0.1–1.0)
Monocytes Relative: 5 %
NRBC: 0 % (ref 0.0–0.2)
Neutro Abs: 6.3 10*3/uL (ref 1.7–7.7)
Neutrophils Relative %: 70 %
Platelets: 156 10*3/uL (ref 150–400)
RBC: 4.65 MIL/uL (ref 4.22–5.81)
RDW: 11.8 % (ref 11.5–15.5)
WBC: 9.1 10*3/uL (ref 4.0–10.5)

## 2018-05-01 LAB — COMPREHENSIVE METABOLIC PANEL
ALT: 27 U/L (ref 0–44)
ANION GAP: 9 (ref 5–15)
AST: 30 U/L (ref 15–41)
Albumin: 4.2 g/dL (ref 3.5–5.0)
Alkaline Phosphatase: 74 U/L (ref 38–126)
BUN: 15 mg/dL (ref 6–20)
CALCIUM: 9.4 mg/dL (ref 8.9–10.3)
CHLORIDE: 105 mmol/L (ref 98–111)
CO2: 28 mmol/L (ref 22–32)
Creatinine, Ser: 1.11 mg/dL (ref 0.61–1.24)
GFR calc Af Amer: >60 mL/min (ref >60)
GFR calc non Af Amer: >60 mL/min (ref >60)
Glucose, Bld: 72 mg/dL (ref 70–99)
Potassium: 3.9 mmol/L (ref 3.5–5.1)
SODIUM: 142 mmol/L (ref 135–145)
Total Bilirubin: 0.5 mg/dL (ref 0.3–1.2)
Total Protein: 7.1 g/dL (ref 6.5–8.1)

## 2018-05-01 LAB — CEA (IN HOUSE-CHCC): CEA (CHCC-IN HOUSE): 1.92 ng/mL (ref 0.00–5.00)

## 2018-05-01 NOTE — Telephone Encounter (Signed)
Scheduled appt per 3/2 los.  Patient declined calendar and avs.

## 2018-05-02 ENCOUNTER — Encounter: Payer: Self-pay | Admitting: Hematology

## 2018-05-08 ENCOUNTER — Telehealth: Payer: Self-pay

## 2018-05-08 NOTE — Telephone Encounter (Signed)
-----   Message from Truitt Merle, MD sent at 05/05/2018 10:12 PM EST ----- Please let pt know his CEA was normal, no concerns, thanks   Truitt Merle  05/05/2018

## 2018-05-08 NOTE — Telephone Encounter (Signed)
Spoke with patient regarding lab results, per Dr. Burr Medico CEA was normal, no concerns.  Patient verbalized an understanding.

## 2018-07-31 NOTE — Progress Notes (Signed)
[place]   Telephone:(336) [phone] Fax:(336) [phone]   Clinic Follow up Note   Patient Care Team: [name], MD as PCP - General (Family Medicine) Nehemiah Settle, MD (Gastroenterology) Leighton Ruff, MD as Consulting Physician (General Surgery) Truitt Merle, MD as Consulting Physician (Hematology)  Date of Service:  08/02/2018  CHIEF COMPLAINT: F/u of left colon cancer  SUMMARY OF ONCOLOGIC HISTORY: Oncology History   Cancer Staging Colon cancer [facility]) Staging form: Colon and Rectum, AJCC 8th Edition - Pathologic stage from 11/10/2016: Stage IIIB (pT3, pN2a, cM0) - Signed by Truitt Merle, MD on 11/22/2016       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 Melina Copa: There is an obviously malignant mass at 28 cm in the sigmoid colon. He was circumferential and the colonoscopy could not be passed through the lesions. Multiple biopsies taken.    10/01/2016 Initial Biopsy    Sigmoid colon mass biopsy showed invasive adenocarcinoma.    10/04/2016 Imaging    CT abdomen/pelvis - no signs of metastatic disease, several enlarged mesenteric lymph nodes    10/13/2016 Imaging    CT chest, abdomen and the pelvis with contrast and showed no evidence of distant recurrence.     10/21/2016 Genetic Testing    The patient had genetic testing due to a personal history of colon cancer at 16.  He was tested for the Common Hereditary Cancer Panel.  The Hereditary Gene Panel offered by [facility] includes sequencing and/or deletion duplication testing of the following 46 genes: APC, ATM, AXIN2, BARD1, BMPR1A, BRCA1, BRCA2, BRIP1, CDH1, CDKN2A (p14ARF), CDKN2A (p16INK4a), CHEK2, CTNNA1, DICER1, EPCAM (Deletion/duplication testing only), GREM1 (promoter region deletion/duplication testing only), KIT, MEN1, MLH1, MSH2, MSH3, MSH6, MUTYH, NBN, NF1, NHTL1, PALB2, PDGFRA, PMS2,  POLD1, POLE, PTEN, RAD50, RAD51C, RAD51D, SDHB, SDHC, SDHD, SMAD4, SMARCA4. STK11, TP53, TSC1, TSC2, and VHL.  The following genes were evaluated for sequence changes only: SDHA and HOXB13 c.251G>A variant only.  Results: No pathogenic mutations identified.  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  The date of this test report is 10/21/2016.     11/10/2016 Pathology Results    Diagnosis- surgical pathology Colon, segmental resection for tumor, rectosigmoid ADENOCARCINOMA OF THE COLON, GRADE 3 (4.5 CM) THE TUMOR INVADES THROUGH THE MUSCULARIS PROPRIA INTO PERICOLONIC SOFT TISSUE (PT3) LYMPHOVASCULAR INVASION IS IDENTIFIED MARGINS OF RESECTION ARE NEGATIVE FOR CARCINOMA METASTATIC ADENOCARCINOMA IN FOUR OF TWENTY-TWO LYMPH NODES (4/22, PN2A)    11/10/2016 Surgery     XI ROBOT ASSISTED SIGMOIDECTOMY  SURGEON:  Surgeon(s): Minas Boston, MD Leighton Ruff, MD     3/35/4562 Miscellaneous    Colon cancer MSI-stable     12/06/2016 - 05/23/2017 Chemotherapy    CAPOX every 3 weeks for 6 months     06/24/2017 Imaging    CT CAP W Contrast 06/24/17 IMPRESSION: 1. Status post surgical resection of a sigmoid colon cancer. No CT findings suspicious for residual tumor, recurrent tumor, regional adenopathy or distant metastatic disease. 2. No acute abdominal findings.    09/30/2017 Procedure    Colonoscopy -Inflamed hyperplastic polyp -No dysplasia or malignancy    01/16/2018 Imaging    01/16/2018 CT CAP IMPRESSION: 1. Questionable new low-attenuation focus in the posterior right liver dome. This may be artifactual due to contrast timing. MRI abdomen without and with IV  contrast recommended to exclude a true liver lesion. 2. No additional potential findings of metastatic disease in the chest, abdomen or pelvis. 3. No evidence of recurrent tumor at the distal colonic anastomosis.      CURRENT THERAPY:  Surveillance    INTERVAL HISTORY:   Andres Wallace is here for a follow up of colon cancer. He presents to the clinic alone. He notes he is doing well. He denies abdominal issues, bloating or cramping. His BMs are normal and appetite adequate. He has no concerns. I reviewed his medication list. He and his neurologist are weaning him off his medication.     REVIEW OF SYSTEMS:   Constitutional: Denies fevers, chills or abnormal weight loss Eyes: Denies blurriness of vision Ears, nose, mouth, throat, and face: Denies mucositis or sore throat Respiratory: Denies cough, dyspnea or wheezes Cardiovascular: Denies palpitation, chest discomfort or lower extremity swelling Gastrointestinal:  Denies nausea, heartburn or change in bowel habits Skin: Denies abnormal skin rashes Lymphatics: Denies new lymphadenopathy or easy bruising Neurological:Denies numbness, tingling or new weaknesses Behavioral/Psych: Mood is stable, no new changes  All other systems were reviewed with the patient and are negative.  MEDICAL HISTORY:  Past Medical History:  Diagnosis Date  . Colon cancer ([place]) dx'd 09/2016  . Family history of lung cancer   . Right inguinal hernia   . Seizures ([place])    3 seizures 2016    SURGICAL HISTORY: Past Surgical History:  Procedure Laterality Date  . APPENDECTOMY  2006  . COLON SURGERY     for colon cancer  . HERNIA REPAIR    . INGUINAL HERNIA REPAIR  10/15/2011   Procedure: HERNIA REPAIR INGUINAL ADULT;  Surgeon: Odis Hollingshead, MD;  Location: [facility];  Service: General;  Laterality: Right;  with mesh   . left knee arthroscopy    . PORT-A-CATH REMOVAL Right 07/07/2017   Procedure: REMOVAL PORT-A-CATH;  Surgeon: Leighton Ruff, MD;  Location[facility];  Service: General;  Laterality: Right;  . PORTACATH PLACEMENT Right 12/01/2016   Procedure: INSERTION PORT-A-CATH;  Surgeon: Leighton Ruff, MD;  [facility];  Service: General;  Laterality: Right;  . PROCTOSCOPY  11/10/2016   Procedure: PROCTOSCOPY;  Surgeon:  Leighton Ruff, MD;  [facility];  Service: General;;  . VASECTOMY  10/15/2011   Procedure: VASECTOMY;  Surgeon: Claybon Jabs, MD;  [facility];  Service: Urology;  Laterality: N/A;    I have reviewed the social history and family history with the patient and they are unchanged from previous note.  ALLERGIES:  has No Known Allergies.  MEDICATIONS:  Current Outpatient Medications  Medication Sig Dispense Refill  . LamoTRIgine XR 200 MG TB24 Take 100 mg by mouth at bedtime. Pt states trying to wean off  5  . MILK THISTLE PO Take 1 tablet by mouth 2 (two) times daily.    . Omega-3 Fatty Acids (FISH OIL PO) Take 2 capsules by mouth daily.    Marland Kitchen OVER THE COUNTER MEDICATION Take 2 capsules by mouth at bedtime. [place] TAIL MUSHROOM SUPPLEMENT    . Tragacanth (ASTRAGALUS ROOT) POWD Take 2 tablets by mouth at bedtime.    . TURMERIC PO Take 1 capsule by mouth 2 (two) times daily.     No current facility-administered medications for this visit.     PHYSICAL EXAMINATION: ECOG PERFORMANCE STATUS: 0 - Asymptomatic  Vitals:   08/02/18 0826  BP: (!) 149/77  Pulse: 65  Resp: 18  Temp: 97.7 F (  36.5 C)  SpO2: 100%   Filed Weights   08/02/18 0826  Weight: 183 lb 12.8 oz (83.4 kg)    GENERAL:alert, no distress and comfortable SKIN: skin color, texture, turgor are normal, no rashes or significant lesions EYES: normal, Conjunctiva are pink and non-injected, sclera clear  NECK: supple, thyroid normal size, non-tender, without nodularity LYMPH:  no palpable lymphadenopathy in the cervical, axillary  LUNGS: clear to auscultation and percussion with normal breathing effort HEART: regular rate & rhythm and no murmurs and no lower extremity edema ABDOMEN:abdomen soft, non-tender and normal bowel sounds Musculoskeletal:no cyanosis of digits and no clubbing  NEURO: alert & oriented x 3 with fluent speech, no focal motor/sensory deficits  LABORATORY DATA:  I have reviewed the data as  listed CBC Latest Ref Rng & Units 08/02/2018 05/01/2018 01/16/2018  WBC 4.0 - 10.5 K/uL 8.0 9.1 6.3  Hemoglobin 13.0 - 17.0 g/dL 15.3 14.7 14.9  Hematocrit 39.0 - 52.0 % 45.5 42.7 42.9  Platelets 150 - 400 K/uL 169 156 157     CMP Latest Ref Rng & Units 05/01/2018 01/16/2018 09/26/2017  Glucose 70 - 99 mg/dL 72 86 68(L)  BUN 6 - 20 mg/dL _0 Creatinine 0.61 - 1.24 mg/dL 1.11 1.12 1.05  Sodium 135 - 145 mmol/L 142 143 141  Potassium 3.5 - 5.1 mmol/L 3.9 4.4 4.3  Chloride 98 - 111 mmol/L 105 105 104  CO2 22 - 32 mmol/L _1 Calcium 8.9 - 10.3 mg/dL 9.4 9.5 9.9  Total Protein 6.5 - 8.1 g/dL 7.1 7.1 7.3  Total Bilirubin 0.3 - 1.2 mg/dL 0.5 0.6 0.6  Alkaline Phos 38 - 126 U/L 74 80 84  AST 15 - 41 U/L 30 108(H) 26  ALT 0 - 44 U/L 27 58(H) 31      RADIOGRAPHIC STUDIES: I have personally reviewed the radiological images as listed and agreed with the findings in the report. No results found.   ASSESSMENT & PLAN:  Andres Wallace is a 38 y.o. male with    1. Left colon cancer,sigmoid colon, invasive adenocarcinoma, G3, pT3N2M0, stage IIIB, MSI stable -He was diagnosed in 09/2016. He is s/p complete surgical resection with negative margins and adjuvant CAPOX.  -We previously discussed cancer surveillance. Risk of recurrence reduces significantly after the first 2-3 years. Will continue to follow him for a total of 5 years with follow ups, labs and physical exam.  surveillance Colonoscopy done on 09/30/2017 was negative for malignancy.  -Last abdominal MRI in 12/2017 shows no evidence of metastasis or malignancy.  -He is clinically doing well. Labs reviewed, CBC and CMP and CEA are still pending. Physical exam was unremarkable. There is no clinical concern for recurrence.   -He is almost 2 years since his diagnosis. His risk of recurrence is reducing. Continue surveillance. Will repeat CT scan in 3 months  -I encouraged him to continue COVID-19 precautions and social distancing.   -F/u in 3 months    2. History of seizure  -Continue Lamictal and a follow-up with his neurologist at [facility]. -His seizure episodes were 4 years ago (2016). No repeat episodes since then. He and his neurologist are weaning him off Lamictal.   3. Transaminitis  -He hadintermittent transaminitis secondary to chemotherapy, resolved after chemo, recurred again in 12/2017  -We previously discussed the possibility of transaminitis, possibly related to previous chemo, medicine/supplement or fatty liver, he does not drink alcohol  -continue monitoring   PLAN: -He is clinically  doing well and stable.  -f/u in 3 months, with lab and CT CAP with contrast a few days before   No problem-specific Assessment & Plan notes found for this encounter.   Orders Placed This Encounter  Procedures  . CT Abdomen Pelvis W Contrast    Standing Status:   Future    Standing Expiration Date:   08/02/2019    Order Specific Question:   If indicated for the ordered procedure, I authorize the administration of contrast media per Radiology protocol    Answer:   Yes    Order Specific Question:   Preferred imaging location?    Answer:   [facility]    Order Specific Question:   Is Oral Contrast requested for this exam?    Answer:   Yes, Per Radiology protocol    Order Specific Question:   Radiology Contrast Protocol - do NOT remove file path    Answer:   \\[email or web address]  . CT Chest W Contrast    Standing Status:   Future    Standing Expiration Date:   08/02/2019    Order Specific Question:   If indicated for the ordered procedure, I authorize the administration of contrast media per Radiology protocol    Answer:   Yes    Order Specific Question:   Preferred imaging location?    Answer:   [facility]    Order Specific Question:   Radiology Contrast Protocol - do NOT remove file path    Answer:   [email or web address]   All questions were  answered. The patient knows to call the clinic with any problems, questions or concerns. No barriers to learning was detected. I spent 10 minutes counseling the patient face to face. The total time spent in the appointment was 15 minutes and more than 50% was on counseling and review of test results     Truitt Merle, MD 08/02/2018   I, Joslyn Devon, am acting as scribe for Truitt Merle, MD.   I have reviewed the above documentation for accuracy and completeness, and I agree with the above.

## 2018-08-02 ENCOUNTER — Encounter: Payer: Self-pay | Admitting: Hematology

## 2018-08-02 ENCOUNTER — Inpatient Hospital Stay: Payer: No Typology Code available for payment source | Attending: Hematology

## 2018-08-02 ENCOUNTER — Other Ambulatory Visit: Payer: Self-pay

## 2018-08-02 ENCOUNTER — Inpatient Hospital Stay (HOSPITAL_BASED_OUTPATIENT_CLINIC_OR_DEPARTMENT_OTHER): Payer: No Typology Code available for payment source | Admitting: Hematology

## 2018-08-02 VITALS — BP 149/77 | HR 65 | Temp 97.7°F | Resp 18 | Ht 72.0 in | Wt 183.8 lb

## 2018-08-02 DIAGNOSIS — C772 Secondary and unspecified malignant neoplasm of intra-abdominal lymph nodes: Secondary | ICD-10-CM | POA: Diagnosis not present

## 2018-08-02 DIAGNOSIS — K449 Diaphragmatic hernia without obstruction or gangrene: Secondary | ICD-10-CM | POA: Diagnosis not present

## 2018-08-02 DIAGNOSIS — Z803 Family history of malignant neoplasm of breast: Secondary | ICD-10-CM | POA: Insufficient documentation

## 2018-08-02 DIAGNOSIS — Z9221 Personal history of antineoplastic chemotherapy: Secondary | ICD-10-CM | POA: Insufficient documentation

## 2018-08-02 DIAGNOSIS — Z8669 Personal history of other diseases of the nervous system and sense organs: Secondary | ICD-10-CM

## 2018-08-02 DIAGNOSIS — R740 Nonspecific elevation of levels of transaminase and lactic acid dehydrogenase [LDH]: Secondary | ICD-10-CM | POA: Diagnosis not present

## 2018-08-02 DIAGNOSIS — C187 Malignant neoplasm of sigmoid colon: Secondary | ICD-10-CM | POA: Insufficient documentation

## 2018-08-02 LAB — CMP (CANCER CENTER ONLY)
ALT: 23 U/L (ref 0–44)
AST: 25 U/L (ref 15–41)
Albumin: 4.2 g/dL (ref 3.5–5.0)
Alkaline Phosphatase: 82 U/L (ref 38–126)
Anion gap: 10 (ref 5–15)
BUN: 13 mg/dL (ref 6–20)
CO2: 24 mmol/L (ref 22–32)
Calcium: 9.5 mg/dL (ref 8.9–10.3)
Chloride: 106 mmol/L (ref 98–111)
Creatinine: 1.09 mg/dL (ref 0.61–1.24)
GFR, Est AFR Am: >60 mL/min (ref >60)
GFR, Estimated: >60 mL/min (ref >60)
Glucose, Bld: 103 mg/dL — ABNORMAL HIGH (ref 70–99)
Potassium: 4.1 mmol/L (ref 3.5–5.1)
Sodium: 140 mmol/L (ref 135–145)
Total Bilirubin: 0.5 mg/dL (ref 0.3–1.2)
Total Protein: 7.1 g/dL (ref 6.5–8.1)

## 2018-08-02 LAB — CBC WITH DIFFERENTIAL/PLATELET
Abs Immature Granulocytes: 0.02 10*3/uL (ref 0.00–0.07)
Basophils Absolute: 0 10*3/uL (ref 0.0–0.1)
Basophils Relative: 0 %
Eosinophils Absolute: 0.2 10*3/uL (ref 0.0–0.5)
Eosinophils Relative: 3 %
HCT: 45.5 % (ref 39.0–52.0)
Hemoglobin: 15.3 g/dL (ref 13.0–17.0)
Immature Granulocytes: 0 %
Lymphocytes Relative: 27 %
Lymphs Abs: 2.2 10*3/uL (ref 0.7–4.0)
MCH: 31.3 pg (ref 26.0–34.0)
MCHC: 33.6 g/dL (ref 30.0–36.0)
MCV: 93 fL (ref 80.0–100.0)
Monocytes Absolute: 0.4 10*3/uL (ref 0.1–1.0)
Monocytes Relative: 5 %
Neutro Abs: 5.1 10*3/uL (ref 1.7–7.7)
Neutrophils Relative %: 65 %
Platelets: 169 10*3/uL (ref 150–400)
RBC: 4.89 MIL/uL (ref 4.22–5.81)
RDW: 11.7 % (ref 11.5–15.5)
WBC: 8 10*3/uL (ref 4.0–10.5)
nRBC: 0 % (ref 0.0–0.2)

## 2018-08-02 LAB — CEA (IN HOUSE-CHCC): CEA (CHCC-In House): 1.55 ng/mL (ref 0.00–5.00)

## 2018-08-03 ENCOUNTER — Telehealth: Payer: Self-pay | Admitting: Hematology

## 2018-08-03 NOTE — Telephone Encounter (Signed)
Scheduled appt per 6/3 los. ° °A calendar will be mailed out. °

## 2018-10-30 NOTE — Progress Notes (Signed)
[place]   [phone] Fax:(336) [phone]   Clinic Follow up Note   Patient Care Team: [name], MD as PCP - General (Family Medicine) Nehemiah Settle, MD (Gastroenterology) Leighton Ruff, MD as Consulting Physician (General Surgery) Truitt Merle, MD as Consulting Physician (Hematology)  Date of Service:  11/03/2018  CHIEF COMPLAINT: F/u of left colon cancer  SUMMARY OF ONCOLOGIC HISTORY: Oncology History Overview Note  Cancer Staging Colon cancer [facility]) Staging form: Colon and Rectum, AJCC 8th Edition - Pathologic stage from 11/10/2016: Stage IIIB (pT3, pN2a, cM0) - Signed by Truitt Merle, MD on 11/22/2016     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 Melina Copa: There is an obviously malignant mass at 28 cm in the sigmoid colon. He was circumferential and the colonoscopy could not be passed through the lesions. Multiple biopsies taken.   10/01/2016 Initial Biopsy   Sigmoid colon mass biopsy showed invasive adenocarcinoma.   10/04/2016 Imaging   CT abdomen/pelvis - no signs of metastatic disease, several enlarged mesenteric lymph nodes   10/13/2016 Imaging   CT chest, abdomen and the pelvis with contrast and showed no evidence of distant recurrence.    10/21/2016 Genetic Testing   The patient had genetic testing due to a personal history of colon cancer at 44.  He was tested for the Common Hereditary Cancer Panel.  The Hereditary Gene Panel offered by [facility] includes sequencing and/or deletion duplication testing of the following 46 genes: APC, ATM, AXIN2, BARD1, BMPR1A, BRCA1, BRCA2, BRIP1, CDH1, CDKN2A (p14ARF), CDKN2A (p16INK4a), CHEK2, CTNNA1, DICER1, EPCAM (Deletion/duplication testing only), GREM1 (promoter region deletion/duplication testing only), KIT, MEN1, MLH1, MSH2, MSH3, MSH6, MUTYH, NBN, NF1, NHTL1, PALB2, PDGFRA, PMS2, POLD1,  POLE, PTEN, RAD50, RAD51C, RAD51D, SDHB, SDHC, SDHD, SMAD4, SMARCA4. STK11, TP53, TSC1, TSC2, and VHL.  The following genes were evaluated for sequence changes only: SDHA and HOXB13 c.251G>A variant only.  Results: No pathogenic mutations identified.  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  The date of this test report is 10/21/2016.    11/10/2016 Pathology Results   Diagnosis- surgical pathology Colon, segmental resection for tumor, rectosigmoid ADENOCARCINOMA OF THE COLON, GRADE 3 (4.5 CM) THE TUMOR INVADES THROUGH THE MUSCULARIS PROPRIA INTO PERICOLONIC SOFT TISSUE (PT3) LYMPHOVASCULAR INVASION IS IDENTIFIED MARGINS OF RESECTION ARE NEGATIVE FOR CARCINOMA METASTATIC ADENOCARCINOMA IN FOUR OF TWENTY-TWO LYMPH NODES (4/22, PN2A)   11/10/2016 Surgery    XI ROBOT ASSISTED SIGMOIDECTOMY  SURGEON:  Surgeon(s): Kwamaine Boston, MD Leighton Ruff, MD    07/31/930 Miscellaneous   Colon cancer MSI-stable    12/06/2016 - 05/23/2017 Chemotherapy   CAPOX every 3 weeks for 6 months    06/24/2017 Imaging   CT CAP W Contrast 06/24/17 IMPRESSION: 1. Status post surgical resection of a sigmoid colon cancer. No CT findings suspicious for residual tumor, recurrent tumor, regional adenopathy or distant metastatic disease. 2. No acute abdominal findings.   09/30/2017 Procedure   Colonoscopy -Inflamed hyperplastic polyp -No dysplasia or malignancy   01/16/2018 Imaging   01/16/2018 CT CAP IMPRESSION: 1. Questionable new low-attenuation focus in the posterior right liver dome. This may be artifactual due to contrast timing. MRI abdomen without and with IV contrast recommended to exclude a true liver lesion. 2. No additional potential findings of metastatic disease in the chest, abdomen or pelvis. 3. No evidence of recurrent  tumor at the distal colonic anastomosis.   11/01/2018 Imaging   CT CAP W Contrast  IMPRESSION: No evidence of metastatic  disease.      CURRENT THERAPY:  Surveillance  INTERVAL HISTORY:  THEODIS KINSEL is here for a follow up of colon cancer. He presents to the clinic alone. He notes he is doing well. 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 He notes he continue to be active and live healthy. He notes he has not been seeing his PCP lately. He notes no residual effects from chemo treatment. Neuropathy has overall resolved.   REVIEW OF SYSTEMS:   Constitutional: Denies fevers, chills or abnormal weight loss Eyes: Denies blurriness of vision Ears, nose, mouth, throat, and face: Denies mucositis or sore throat Respiratory: Denies cough, dyspnea or wheezes Cardiovascular: Denies palpitation, chest discomfort or lower extremity swelling Gastrointestinal:  Denies nausea, heartburn or change in bowel habits Skin: Denies abnormal skin rashes Lymphatics: Denies new lymphadenopathy or easy bruising Neurological:Denies numbness, tingling or new weaknesses Behavioral/Psych: Mood is stable, no new changes  All other systems were reviewed with the patient and are negative.  MEDICAL HISTORY:  Past Medical History:  Diagnosis Date  . Colon cancer ([place]) dx'd 09/2016  . Family history of lung cancer   . Right inguinal hernia   . Seizures ([place])    3 seizures 2016    SURGICAL HISTORY: Past Surgical History:  Procedure Laterality Date  . APPENDECTOMY  2006  . COLON SURGERY     for colon cancer  . HERNIA REPAIR    . INGUINAL HERNIA REPAIR  10/15/2011   Procedure: HERNIA REPAIR INGUINAL ADULT;  Surgeon: Odis Hollingshead, MD;  Location: [facility];  Service: General;  Laterality: Right;  with mesh   . left knee arthroscopy    . PORT-A-CATH REMOVAL Right 07/07/2017   Procedure: REMOVAL PORT-A-CATH;  Surgeon: Leighton Ruff, MD;  Location: [facility];  Service: General;  Laterality: Right;  . PORTACATH PLACEMENT Right 12/01/2016   Procedure: INSERTION PORT-A-CATH;  Surgeon: Leighton Ruff, MD;  Location: [facility];   Service: General;  Laterality: Right;  . PROCTOSCOPY  11/10/2016   Procedure: PROCTOSCOPY;  Surgeon: Leighton Ruff, MD;  Location: [facility];  Service: General;;  . VASECTOMY  10/15/2011   Procedure: VASECTOMY;  Surgeon: Claybon Jabs, MD;  Location: [facility];  Service: Urology;  Laterality: N/A;    I have reviewed the social history and family history with the patient and they are unchanged from previous note.  ALLERGIES:  has No Known Allergies.  MEDICATIONS:  Current Outpatient Medications  Medication Sig Dispense Refill  . LamoTRIgine XR 200 MG TB24 Take 100 mg by mouth at bedtime. Pt states trying to wean off  5  . MILK THISTLE PO Take 1 tablet by mouth 2 (two) times daily.    . Omega-3 Fatty Acids (FISH OIL PO) Take 2 capsules by mouth daily.    Marland Kitchen OVER THE COUNTER MEDICATION Take 2 capsules by mouth at bedtime. [place] TAIL MUSHROOM SUPPLEMENT    . Tragacanth (ASTRAGALUS ROOT) POWD Take 2 tablets by mouth at bedtime.    . TURMERIC PO Take 1 capsule by mouth 2 (two) times daily.     No current facility-administered medications for this visit.     PHYSICAL EXAMINATION: ECOG PERFORMANCE STATUS: 0 - Asymptomatic  Vitals:   11/03/18 1338  BP: 137/72  Pulse: 73  Resp: 17  Temp: 98.7 F (37.1 C)  SpO2: 100%  Filed Weights   11/03/18 1338  Weight: 182 lb 4.8 oz (82.7 kg)    GENERAL:alert, no distress and comfortable SKIN: skin color, texture, turgor are normal, no rashes or significant lesions EYES: normal, Conjunctiva are pink and non-injected, sclera clear  NECK: supple, thyroid normal size, non-tender, without nodularity LYMPH:  no palpable lymphadenopathy in the cervical, axillary  LUNGS: clear to auscultation and percussion with normal breathing effort HEART: regular rate & rhythm and no murmurs and no lower extremity edema ABDOMEN:abdomen soft, non-tender and normal bowel sounds Musculoskeletal:no cyanosis of digits and no clubbing  NEURO: alert & oriented x 3 with  fluent speech, no focal motor/sensory deficits  LABORATORY DATA:  I have reviewed the data as listed CBC Latest Ref Rng & Units 11/01/2018 08/02/2018 05/01/2018  WBC 4.0 - 10.5 K/uL 6.5 8.0 9.1  Hemoglobin 13.0 - 17.0 g/dL 15.6 15.3 14.7  Hematocrit 39.0 - 52.0 % 44.8 45.5 42.7  Platelets 150 - 400 K/uL 167 169 156     CMP Latest Ref Rng & Units 11/01/2018 08/02/2018 05/01/2018  Glucose 70 - 99 mg/dL 90 103(H) 72  BUN 6 - 20 mg/dL _0 Creatinine 0.61 - 1.24 mg/dL 1.04 1.09 1.11  Sodium 135 - 145 mmol/L 140 140 142  Potassium 3.5 - 5.1 mmol/L 4.9 4.1 3.9  Chloride 98 - 111 mmol/L 105 106 105  CO2 22 - 32 mmol/L _1 Calcium 8.9 - 10.3 mg/dL 9.3 9.5 9.4  Total Protein 6.5 - 8.1 g/dL 7.0 7.1 7.1  Total Bilirubin 0.3 - 1.2 mg/dL 0.6 0.5 0.5  Alkaline Phos 38 - 126 U/L 66 82 74  AST 15 - 41 U/L _2 ALT 0 - 44 U/L _3 RADIOGRAPHIC STUDIES: I have personally reviewed the radiological images as listed and agreed with the findings in the report. No results found.   ASSESSMENT & PLAN:  Andres Wallace is a 38 y.o. male with   1. Left colon cancer,sigmoid colon, invasive adenocarcinoma, G3, pT3N2M0, stage IIIB, MSI stable -He was diagnosed in 09/2016. He is s/pcomplete surgical resection with negative marginsand adjuvant CAPOX.  -We previously discussed cancer surveillance. Risk of recurrence reduces significantly after the first 2-3 years. Will continue to follow him for a total of 5 years with follow ups, labs and physical exam.  surveillanceColonoscopy done on 09/30/2017 was negative for malignancy.  -I personally reviewed and discussed his CT CAP from 11/01/18 which showed NED and liver was unremarkable.  -He is clinically doing well. Labs reviewed from this week, CBC, CMP, CEA within normal limits. There is no clinical concern for recurrence.  -I discussed his risk of cancer recurrence highest in the first 2 years and will significantly decrease after 3 years. He is  2 years since diagnosis. Will repeat CT scan in 10/2019. Will continue to closely monitor for 5 years. I encouraged him to watch for signs of recurrence.  -I strongly encouraged him to have flu shot this year and continue COVID-19 precautions.  -F/u in 4 months  2. History of seizure  -Continue Lamictal and a follow-up with his neurologist at [facility]. -His seizure episodes were 4 years ago (2016). No repeat episodes since then. He and his neurologist are weaning him off Lamictal.     PLAN: -He is clinically doing very well  -scan and lab reviewed, NED  -Lab and f/u in 4 months    No problem-specific Assessment &  Plan notes found for this encounter.   No orders of the defined types were placed in this encounter.  All questions were answered. The patient knows to call the clinic with any problems, questions or concerns. No barriers to learning was detected. I spent 20 minutes counseling the patient face to face. The total time spent in the appointment was 25 minutes and more than 50% was on counseling and review of test results     Truitt Merle, MD 11/03/2018   I, Joslyn Devon, am acting as scribe for Truitt Merle, MD.   I have reviewed the above documentation for accuracy and completeness, and I agree with the above.

## 2018-11-01 ENCOUNTER — Inpatient Hospital Stay: Payer: No Typology Code available for payment source | Attending: Hematology

## 2018-11-01 ENCOUNTER — Other Ambulatory Visit: Payer: Self-pay

## 2018-11-01 ENCOUNTER — Encounter (HOSPITAL_COMMUNITY): Payer: Self-pay

## 2018-11-01 ENCOUNTER — Ambulatory Visit (HOSPITAL_COMMUNITY)
Admission: RE | Admit: 2018-11-01 | Discharge: 2018-11-01 | Disposition: A | Discharge status: Home / Self Care | Payer: No Typology Code available for payment source | Source: Ambulatory Visit | Attending: Hematology | Admitting: Hematology

## 2018-11-01 DIAGNOSIS — Z801 Family history of malignant neoplasm of trachea, bronchus and lung: Secondary | ICD-10-CM | POA: Insufficient documentation

## 2018-11-01 DIAGNOSIS — C772 Secondary and unspecified malignant neoplasm of intra-abdominal lymph nodes: Secondary | ICD-10-CM | POA: Insufficient documentation

## 2018-11-01 DIAGNOSIS — Z9221 Personal history of antineoplastic chemotherapy: Secondary | ICD-10-CM | POA: Insufficient documentation

## 2018-11-01 DIAGNOSIS — C187 Malignant neoplasm of sigmoid colon: Secondary | ICD-10-CM | POA: Diagnosis not present

## 2018-11-01 DIAGNOSIS — Z85038 Personal history of other malignant neoplasm of large intestine: Secondary | ICD-10-CM | POA: Insufficient documentation

## 2018-11-01 DIAGNOSIS — G40909 Epilepsy, unspecified, not intractable, without status epilepticus: Secondary | ICD-10-CM | POA: Insufficient documentation

## 2018-11-01 DIAGNOSIS — Z79899 Other long term (current) drug therapy: Secondary | ICD-10-CM | POA: Insufficient documentation

## 2018-11-01 LAB — CBC WITH DIFFERENTIAL/PLATELET
Abs Immature Granulocytes: 0.02 10*3/uL (ref 0.00–0.07)
Basophils Absolute: 0 10*3/uL (ref 0.0–0.1)
Basophils Relative: 1 %
Eosinophils Absolute: 0.4 10*3/uL (ref 0.0–0.5)
Eosinophils Relative: 5 %
HCT: 44.8 % (ref 39.0–52.0)
Hemoglobin: 15.6 g/dL (ref 13.0–17.0)
Immature Granulocytes: 0 %
Lymphocytes Relative: 35 %
Lymphs Abs: 2.3 10*3/uL (ref 0.7–4.0)
MCH: 31.8 pg (ref 26.0–34.0)
MCHC: 34.8 g/dL (ref 30.0–36.0)
MCV: 91.2 fL (ref 80.0–100.0)
Monocytes Absolute: 0.4 10*3/uL (ref 0.1–1.0)
Monocytes Relative: 6 %
Neutro Abs: 3.5 10*3/uL (ref 1.7–7.7)
Neutrophils Relative %: 53 %
Platelets: 167 10*3/uL (ref 150–400)
RBC: 4.91 MIL/uL (ref 4.22–5.81)
RDW: 11.6 % (ref 11.5–15.5)
WBC: 6.5 10*3/uL (ref 4.0–10.5)
nRBC: 0 % (ref 0.0–0.2)

## 2018-11-01 LAB — COMPREHENSIVE METABOLIC PANEL
ALT: 21 U/L (ref 0–44)
AST: 23 U/L (ref 15–41)
Albumin: 4.2 g/dL (ref 3.5–5.0)
Alkaline Phosphatase: 66 U/L (ref 38–126)
Anion gap: 9 (ref 5–15)
BUN: 13 mg/dL (ref 6–20)
CO2: 26 mmol/L (ref 22–32)
Calcium: 9.3 mg/dL (ref 8.9–10.3)
Chloride: 105 mmol/L (ref 98–111)
Creatinine, Ser: 1.04 mg/dL (ref 0.61–1.24)
GFR calc Af Amer: >60 mL/min (ref >60)
GFR calc non Af Amer: >60 mL/min (ref >60)
Glucose, Bld: 90 mg/dL (ref 70–99)
Potassium: 4.9 mmol/L (ref 3.5–5.1)
Sodium: 140 mmol/L (ref 135–145)
Total Bilirubin: 0.6 mg/dL (ref 0.3–1.2)
Total Protein: 7 g/dL (ref 6.5–8.1)

## 2018-11-01 LAB — CEA (IN HOUSE-CHCC): CEA (CHCC-In House): 1.79 ng/mL (ref 0.00–5.00)

## 2018-11-01 MED ORDER — SODIUM CHLORIDE (PF) 0.9 % IJ SOLN
INTRAMUSCULAR | Status: AC
  Filled 2018-11-01: qty 50

## 2018-11-01 MED ORDER — IOHEXOL 300 MG/ML  SOLN
100.0000 mL | Freq: Once | INTRAMUSCULAR | Status: AC | PRN
  Administered 2018-11-01: 09:00:00 100 mL via INTRAVENOUS

## 2018-11-03 ENCOUNTER — Other Ambulatory Visit: Payer: Self-pay

## 2018-11-03 ENCOUNTER — Encounter: Payer: Self-pay | Admitting: Hematology

## 2018-11-03 ENCOUNTER — Inpatient Hospital Stay (HOSPITAL_BASED_OUTPATIENT_CLINIC_OR_DEPARTMENT_OTHER): Payer: No Typology Code available for payment source | Admitting: Hematology

## 2018-11-03 VITALS — BP 137/72 | HR 73 | Temp 98.7°F | Resp 17 | Ht 72.0 in | Wt 182.3 lb

## 2018-11-03 DIAGNOSIS — Z85038 Personal history of other malignant neoplasm of large intestine: Secondary | ICD-10-CM | POA: Diagnosis not present

## 2018-11-03 DIAGNOSIS — G40909 Epilepsy, unspecified, not intractable, without status epilepticus: Secondary | ICD-10-CM | POA: Diagnosis not present

## 2018-11-03 DIAGNOSIS — Z9221 Personal history of antineoplastic chemotherapy: Secondary | ICD-10-CM | POA: Diagnosis not present

## 2018-11-03 DIAGNOSIS — Z801 Family history of malignant neoplasm of trachea, bronchus and lung: Secondary | ICD-10-CM | POA: Diagnosis not present

## 2018-11-03 DIAGNOSIS — C187 Malignant neoplasm of sigmoid colon: Secondary | ICD-10-CM

## 2018-11-03 DIAGNOSIS — C772 Secondary and unspecified malignant neoplasm of intra-abdominal lymph nodes: Secondary | ICD-10-CM | POA: Diagnosis not present

## 2018-11-03 DIAGNOSIS — Z79899 Other long term (current) drug therapy: Secondary | ICD-10-CM | POA: Diagnosis not present

## 2018-11-08 ENCOUNTER — Telehealth: Payer: Self-pay | Admitting: Hematology

## 2018-11-08 NOTE — Telephone Encounter (Signed)
Called and left msg. Mailed printout  °

## 2019-03-07 ENCOUNTER — Telehealth: Payer: Self-pay | Admitting: Hematology

## 2019-03-07 NOTE — Telephone Encounter (Signed)
Returned patient's phone call regarding rescheduling an appointment, per patient's request 01/08 appointment has moved to 01/28.

## 2019-03-09 ENCOUNTER — Inpatient Hospital Stay: Payer: No Typology Code available for payment source | Admitting: Hematology

## 2019-03-09 ENCOUNTER — Inpatient Hospital Stay: Payer: No Typology Code available for payment source

## 2019-03-22 NOTE — Progress Notes (Signed)
Andres Wallace   Telephone:(336) [phone] Fax:(336) [phone]   Clinic Follow up Note   Patient Care Team: [name], MD as PCP - General (Family Medicine) Nehemiah Settle, MD (Gastroenterology) Leighton Ruff, MD as Consulting Physician (General Surgery) Truitt Merle, MD as Consulting Physician (Hematology)  Date of Service:  03/29/2019  CHIEF COMPLAINT: F/u of left colon cancer  SUMMARY OF ONCOLOGIC HISTORY: Oncology History Overview Note  Cancer Staging Colon cancer [facility]) Staging form: Colon and Rectum, AJCC 8th Edition - Pathologic stage from 11/10/2016: Stage IIIB (pT3, pN2a, cM0) - Signed by Truitt Merle, MD on 11/22/2016     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 Melina Copa: There is an obviously malignant mass at 28 cm in the sigmoid colon. He was circumferential and the colonoscopy could not be passed through the lesions. Multiple biopsies taken.   10/01/2016 Initial Biopsy   Sigmoid colon mass biopsy showed invasive adenocarcinoma.   10/04/2016 Imaging   CT abdomen/pelvis - no signs of metastatic disease, several enlarged mesenteric lymph nodes   10/13/2016 Imaging   CT chest, abdomen and the pelvis with contrast and showed no evidence of distant recurrence.    10/21/2016 Genetic Testing   The patient had genetic testing due to a personal history of colon cancer at 20.  He was tested for the Common Hereditary Cancer Panel.  The Hereditary Gene Panel offered by [facility] includes sequencing and/or deletion duplication testing of the following 46 genes: APC, ATM, AXIN2, BARD1, BMPR1A, BRCA1, BRCA2, BRIP1, CDH1, CDKN2A (p14ARF), CDKN2A (p16INK4a), CHEK2, CTNNA1, DICER1, EPCAM (Deletion/duplication testing only), GREM1 (promoter region deletion/duplication testing only), KIT, MEN1, MLH1, MSH2, MSH3, MSH6, MUTYH, NBN, NF1, NHTL1, PALB2, PDGFRA, PMS2,  POLD1, POLE, PTEN, RAD50, RAD51C, RAD51D, SDHB, SDHC, SDHD, SMAD4, SMARCA4. STK11, TP53, TSC1, TSC2, and VHL.  The following genes were evaluated for sequence changes only: SDHA and HOXB13 c.251G>A variant only.  Results: No pathogenic mutations identified.  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  The date of this test report is 10/21/2016.    11/10/2016 Pathology Results   Diagnosis- surgical pathology Colon, segmental resection for tumor, rectosigmoid ADENOCARCINOMA OF THE COLON, GRADE 3 (4.5 CM) THE TUMOR INVADES THROUGH THE MUSCULARIS PROPRIA INTO PERICOLONIC SOFT TISSUE (PT3) LYMPHOVASCULAR INVASION IS IDENTIFIED MARGINS OF RESECTION ARE NEGATIVE FOR CARCINOMA METASTATIC ADENOCARCINOMA IN FOUR OF TWENTY-TWO LYMPH NODES (4/22, PN2A)   11/10/2016 Surgery    XI ROBOT ASSISTED SIGMOIDECTOMY  SURGEON:  Surgeon(s): Britten Boston, MD Leighton Ruff, MD    03/03/1592 Miscellaneous   Colon cancer MSI-stable    12/06/2016 - 05/23/2017 Chemotherapy   CAPOX every 3 weeks for 6 months    06/24/2017 Imaging   CT CAP W Contrast 06/24/17 IMPRESSION: 1. Status post surgical resection of a sigmoid colon cancer. No CT findings suspicious for residual tumor, recurrent tumor, regional adenopathy or distant metastatic disease. 2. No acute abdominal findings.   09/30/2017 Procedure   Colonoscopy -Inflamed hyperplastic polyp -No dysplasia or malignancy   01/16/2018 Imaging   01/16/2018 CT CAP IMPRESSION: 1. Questionable new low-attenuation focus in the posterior right liver dome. This may be artifactual due to contrast timing. MRI abdomen without and with IV contrast recommended to exclude a true liver lesion. 2. No additional potential findings of metastatic disease in the chest, abdomen or pelvis. 3. No evidence of recurrent  tumor at the distal colonic anastomosis.   11/01/2018 Imaging   CT CAP W Contrast  IMPRESSION: No evidence of  metastatic disease.      CURRENT THERAPY:  Surveillance  INTERVAL HISTORY:  Andres Wallace is here for a follow up of colon cancer. He presents to the clinic alone. He notes he is doing well. He notes he continues to go to work. He denies any new changes. He denies stomach pain, he eats well and has gained weight.     REVIEW OF SYSTEMS:   Constitutional: Denies fevers, chills or abnormal weight loss Eyes: Denies blurriness of vision Ears, nose, mouth, throat, and face: Denies mucositis or sore throat Respiratory: Denies cough, dyspnea or wheezes Cardiovascular: Denies palpitation, chest discomfort or lower extremity swelling Gastrointestinal:  Denies nausea, heartburn or change in bowel habits Skin: Denies abnormal skin rashes Lymphatics: Denies new lymphadenopathy or easy bruising Neurological:Denies numbness, tingling or new weaknesses Behavioral/Psych: Mood is stable, no new changes  All other systems were reviewed with the patient and are negative.  MEDICAL HISTORY:  Past Medical History:  Diagnosis Date  . Colon cancer ([place]) dx'd 09/2016  . Family history of lung cancer   . Right inguinal hernia   . Seizures ([place])    3 seizures 2016    SURGICAL HISTORY: Past Surgical History:  Procedure Laterality Date  . APPENDECTOMY  2006  . COLON SURGERY     for colon cancer  . HERNIA REPAIR    . INGUINAL HERNIA REPAIR  10/15/2011   Procedure: HERNIA REPAIR INGUINAL ADULT;  Surgeon: Odis Hollingshead, MD;  [facility];  Service: General;  Laterality: Right;  with mesh   . left knee arthroscopy    . PORT-A-CATH REMOVAL Right 07/07/2017   Procedure: REMOVAL PORT-A-CATH;  Surgeon: Leighton Ruff, MD;  [facility];  Service: General;  Laterality: Right;  . PORTACATH PLACEMENT Right 12/01/2016   Procedure: INSERTION PORT-A-CATH;  Surgeon: Leighton Ruff, MD;  [facility];  Service: General;  Laterality: Right;  . PROCTOSCOPY  11/10/2016   Procedure: PROCTOSCOPY;   Surgeon: Leighton Ruff, MD;  [facility];  Service: General;;  . VASECTOMY  10/15/2011   Procedure: VASECTOMY;  Surgeon: Claybon Jabs, MD;  [facility];  Service: Urology;  Laterality: N/A;    I have reviewed the social history and family history with the patient and they are unchanged from previous note.  ALLERGIES:  has No Known Allergies.  MEDICATIONS:  Current Outpatient Medications  Medication Sig Dispense Refill  . LamoTRIgine XR 200 MG TB24 Take 100 mg by mouth at bedtime. Pt states trying to wean off  5  . MILK THISTLE PO Take 1 tablet by mouth 2 (two) times daily.    . Omega-3 Fatty Acids (FISH OIL PO) Take 2 capsules by mouth daily.    Marland Kitchen OVER THE COUNTER MEDICATION Take 2 capsules by mouth at bedtime. [place] TAIL MUSHROOM SUPPLEMENT    . Tragacanth (ASTRAGALUS ROOT) POWD Take 2 tablets by mouth at bedtime.    . TURMERIC PO Take 1 capsule by mouth 2 (two) times daily.     No current facility-administered medications for this visit.    PHYSICAL EXAMINATION: ECOG PERFORMANCE STATUS: 0 - Asymptomatic  Vitals:   03/29/19 0825  BP: (!) 151/73  Pulse: 77  Resp: 16  Temp: 98 F (36.7 C)  SpO2: 100%   Filed Weights   03/29/19 0825  Weight: 187 lb (84.8 kg)  GENERAL:alert, no distress and comfortable SKIN: skin color, texture, turgor are normal, no rashes or significant lesions EYES: normal, Conjunctiva are pink and non-injected, sclera clear  NECK: supple, thyroid normal size, non-tender, without nodularity LYMPH:  no palpable lymphadenopathy in the cervical, axillary  LUNGS: clear to auscultation and percussion with normal breathing effort HEART: regular rate & rhythm and no murmurs and no lower extremity edema ABDOMEN:abdomen soft, non-tender and normal bowel sounds Musculoskeletal:no cyanosis of digits and no clubbing  NEURO: alert & oriented x 3 with fluent speech, no focal motor/sensory deficits  LABORATORY DATA:  I have reviewed the data as  listed CBC Latest Ref Rng & Units 03/29/2019 11/01/2018 08/02/2018  WBC 4.0 - 10.5 K/uL 7.8 6.5 8.0  Hemoglobin 13.0 - 17.0 g/dL 14.9 15.6 15.3  Hematocrit 39.0 - 52.0 % 43.4 44.8 45.5  Platelets 150 - 400 K/uL 178 167 169     CMP Latest Ref Rng & Units 03/29/2019 11/01/2018 08/02/2018  Glucose 70 - 99 mg/dL 68(L) 90 103(H)  BUN 6 - 20 mg/dL 14 13 13   Creatinine 0.61 - 1.24 mg/dL 1.10 1.04 1.09  Sodium 135 - 145 mmol/L 142 140 140  Potassium 3.5 - 5.1 mmol/L 4.1 4.9 4.1  Chloride 98 - 111 mmol/L 105 105 106  CO2 22 - 32 mmol/L 29 26 24   Calcium 8.9 - 10.3 mg/dL 9.4 9.3 9.5  Total Protein 6.5 - 8.1 g/dL 7.1 7.0 7.1  Total Bilirubin 0.3 - 1.2 mg/dL 0.7 0.6 0.5  Alkaline Phos 38 - 126 U/L 79 66 82  AST 15 - 41 U/L 21 23 25   ALT 0 - 44 U/L 22 21 23       RADIOGRAPHIC STUDIES: I have personally reviewed the radiological images as listed and agreed with the findings in the report. No results found.   ASSESSMENT & PLAN:  Andres Wallace is a 39 y.o. male with    1. Left colon cancer,sigmoid colon, invasive adenocarcinoma, G3, pT3N2M0, stage IIIB, MSI stable -He was diagnosed in 09/2016. He is s/pcomplete surgical resection with negative marginsand adjuvant CAPOX.  -Wepreviouslydiscussed cancer surveillance. Risk of recurrence reduces significantly after the first 2-3 years. Will continue to follow him for a total of 5 years with follow ups, labs and physical exam.  surveillanceColonoscopy done on 09/30/2017 was negative for malignancy.  -He is clinically doing well. Labs reviewed, CBC and CMP WNL. CEA is still pending, has been normal. Physical exam unremarkable. There is no clinical concern for recurrence.  -He is 2.5 years from his diagnosis. His risk of recurrence decreases significantly after 3 years, will continue 5 year surveillance. Repeat next scan in 10/2019, likely last routine scan.  -F/u in 4 months, will order CT scan on next visit    2. History of seizure  -Continue  Lamictal and a follow-up with his neurologist at [facility]. -His seizure episodes were 4 years ago (2016). No repeat episodes since then. He and his neurologist are weaning him off Lamictal.   3. Elevated BP  -His BP has been elevated lately in clinic. BP at 151/73 today (03/29/19). He notes he is anxious when he comes to clinic.  -I recommend he get BP cuff to monitor at home. I also encouraged him to reduce salt intake, exercise and drink plenty of water.  -He plans to f/u with PCP in 2 weeks     PLAN: -He is clinically doing very well  -Lab and f/u in 4 months, will order CT scan on  next visit    No problem-specific Assessment & Plan notes found for this encounter.   No orders of the defined types were placed in this encounter.  All questions were answered. The patient knows to call the clinic with any problems, questions or concerns. No barriers to learning was detected. The total time spent in the appointment was 20 minutes.     Truitt Merle, MD 03/29/2019   I, Joslyn Devon, am acting as scribe for Truitt Merle, MD.   I have reviewed the above documentation for accuracy and completeness, and I agree with the above.

## 2019-03-29 ENCOUNTER — Encounter: Payer: Self-pay | Admitting: Hematology

## 2019-03-29 ENCOUNTER — Inpatient Hospital Stay: Payer: No Typology Code available for payment source | Attending: Hematology

## 2019-03-29 ENCOUNTER — Inpatient Hospital Stay (HOSPITAL_BASED_OUTPATIENT_CLINIC_OR_DEPARTMENT_OTHER): Payer: No Typology Code available for payment source | Admitting: Hematology

## 2019-03-29 ENCOUNTER — Telehealth: Payer: Self-pay | Admitting: Hematology

## 2019-03-29 ENCOUNTER — Other Ambulatory Visit: Payer: Self-pay

## 2019-03-29 VITALS — BP 151/73 | HR 77 | Temp 98.0°F | Resp 16 | Ht 72.0 in | Wt 187.0 lb

## 2019-03-29 DIAGNOSIS — Z9221 Personal history of antineoplastic chemotherapy: Secondary | ICD-10-CM | POA: Diagnosis not present

## 2019-03-29 DIAGNOSIS — C187 Malignant neoplasm of sigmoid colon: Secondary | ICD-10-CM

## 2019-03-29 DIAGNOSIS — Z85038 Personal history of other malignant neoplasm of large intestine: Secondary | ICD-10-CM | POA: Insufficient documentation

## 2019-03-29 DIAGNOSIS — Z8679 Personal history of other diseases of the circulatory system: Secondary | ICD-10-CM | POA: Insufficient documentation

## 2019-03-29 DIAGNOSIS — Z801 Family history of malignant neoplasm of trachea, bronchus and lung: Secondary | ICD-10-CM | POA: Insufficient documentation

## 2019-03-29 DIAGNOSIS — Z79899 Other long term (current) drug therapy: Secondary | ICD-10-CM | POA: Insufficient documentation

## 2019-03-29 DIAGNOSIS — R030 Elevated blood-pressure reading, without diagnosis of hypertension: Secondary | ICD-10-CM | POA: Insufficient documentation

## 2019-03-29 DIAGNOSIS — C772 Secondary and unspecified malignant neoplasm of intra-abdominal lymph nodes: Secondary | ICD-10-CM

## 2019-03-29 DIAGNOSIS — Z800 Family history of malignant neoplasm of digestive organs: Secondary | ICD-10-CM | POA: Insufficient documentation

## 2019-03-29 LAB — COMPREHENSIVE METABOLIC PANEL
ALT: 22 U/L (ref 0–44)
AST: 21 U/L (ref 15–41)
Albumin: 4.3 g/dL (ref 3.5–5.0)
Alkaline Phosphatase: 79 U/L (ref 38–126)
Anion gap: 8 (ref 5–15)
BUN: 14 mg/dL (ref 6–20)
CO2: 29 mmol/L (ref 22–32)
Calcium: 9.4 mg/dL (ref 8.9–10.3)
Chloride: 105 mmol/L (ref 98–111)
Creatinine, Ser: 1.1 mg/dL (ref 0.61–1.24)
GFR calc Af Amer: >60 mL/min (ref >60)
GFR calc non Af Amer: >60 mL/min (ref >60)
Glucose, Bld: 68 mg/dL — ABNORMAL LOW (ref 70–99)
Potassium: 4.1 mmol/L (ref 3.5–5.1)
Sodium: 142 mmol/L (ref 135–145)
Total Bilirubin: 0.7 mg/dL (ref 0.3–1.2)
Total Protein: 7.1 g/dL (ref 6.5–8.1)

## 2019-03-29 LAB — CEA (IN HOUSE-CHCC): CEA (CHCC-In House): 1.52 ng/mL (ref 0.00–5.00)

## 2019-03-29 LAB — CBC WITH DIFFERENTIAL/PLATELET
Abs Immature Granulocytes: 0.01 10*3/uL (ref 0.00–0.07)
Basophils Absolute: 0.1 10*3/uL (ref 0.0–0.1)
Basophils Relative: 1 %
Eosinophils Absolute: 0.5 10*3/uL (ref 0.0–0.5)
Eosinophils Relative: 6 %
HCT: 43.4 % (ref 39.0–52.0)
Hemoglobin: 14.9 g/dL (ref 13.0–17.0)
Immature Granulocytes: 0 %
Lymphocytes Relative: 34 %
Lymphs Abs: 2.7 10*3/uL (ref 0.7–4.0)
MCH: 31.2 pg (ref 26.0–34.0)
MCHC: 34.3 g/dL (ref 30.0–36.0)
MCV: 91 fL (ref 80.0–100.0)
Monocytes Absolute: 0.5 10*3/uL (ref 0.1–1.0)
Monocytes Relative: 6 %
Neutro Abs: 4.1 10*3/uL (ref 1.7–7.7)
Neutrophils Relative %: 53 %
Platelets: 178 10*3/uL (ref 150–400)
RBC: 4.77 MIL/uL (ref 4.22–5.81)
RDW: 11.6 % (ref 11.5–15.5)
WBC: 7.8 10*3/uL (ref 4.0–10.5)
nRBC: 0 % (ref 0.0–0.2)

## 2019-03-29 NOTE — Telephone Encounter (Signed)
Scheduled appt per 1/28 los.  Sent a message to HIM pool to get a calendar mailed out. 

## 2019-03-30 ENCOUNTER — Encounter: Payer: Self-pay | Admitting: Hematology

## 2019-04-09 ENCOUNTER — Ambulatory Visit (INDEPENDENT_AMBULATORY_CARE_PROVIDER_SITE_OTHER): Payer: No Typology Code available for payment source | Admitting: Cardiology

## 2019-04-09 ENCOUNTER — Other Ambulatory Visit: Payer: Self-pay

## 2019-04-09 ENCOUNTER — Encounter: Payer: Self-pay | Admitting: Cardiology

## 2019-04-09 VITALS — BP 140/70 | HR 82 | Ht 72.0 in | Wt 190.0 lb

## 2019-04-09 DIAGNOSIS — R011 Cardiac murmur, unspecified: Secondary | ICD-10-CM | POA: Diagnosis not present

## 2019-04-09 DIAGNOSIS — R002 Palpitations: Secondary | ICD-10-CM | POA: Diagnosis not present

## 2019-04-09 DIAGNOSIS — Z801 Family history of malignant neoplasm of trachea, bronchus and lung: Secondary | ICD-10-CM | POA: Diagnosis not present

## 2019-04-09 NOTE — Patient Instructions (Signed)
Medication Instructions:  No medication changes *If you need a refill on your cardiac medications before your next appointment, please call your pharmacy*  Lab Work: None ordered If you have labs (blood work) drawn today and your tests are completely normal, you will receive your results only by: Marland Kitchen MyChart Message (if you have MyChart) OR . A paper copy in the mail If you have any lab test that is abnormal or we need to change your treatment, we will call you to review the results.  Testing/Procedures: Your physician has requested that you have an echocardiogram. Echocardiography is a painless test that uses sound waves to create images of your heart. It provides your doctor with information about the size and shape of your heart and how well your heart's chambers and valves are working. This procedure takes approximately one hour. There are no restrictions for this procedure.    Follow-Up: At [facility], you and your health needs are our priority.  As part of our continuing mission to provide you with exceptional heart care, we have created designated Provider Care Teams.  These Care Teams include your primary Cardiologist (physician) and Advanced Practice Providers (APPs -  Physician Assistants and Nurse Practitioners) who all work together to provide you with the care you need, when you need it.  Your next appointment:   3 month(s)  The format for your next appointment:   In Person  Provider:   Jyl Heinz, MD  Other Instructions  Echocardiogram An echocardiogram is a procedure that uses painless sound waves (ultrasound) to produce an image of the heart. Images from an echocardiogram can provide important information about:  Signs of coronary artery disease (CAD).  Aneurysm detection. An aneurysm is a weak or damaged part of an artery wall that bulges out from the normal force of blood pumping through the body.  Heart size and shape. Changes in the size or shape of the  heart can be associated with certain conditions, including heart failure, aneurysm, and CAD.  Heart muscle function.  Heart valve function.  Signs of a past heart attack.  Fluid buildup around the heart.  Thickening of the heart muscle.  A tumor or infectious growth around the heart valves. Tell a health care provider about:  Any allergies you have.  All medicines you are taking, including vitamins, herbs, eye drops, creams, and over-the-counter medicines.  Any blood disorders you have.  Any surgeries you have had.  Any medical conditions you have.  Whether you are pregnant or may be pregnant. What are the risks? Generally, this is a safe procedure. However, problems may occur, including:  Allergic reaction to dye (contrast) that may be used during the procedure. What happens before the procedure? No specific preparation is needed. You may eat and drink normally. What happens during the procedure?   An IV tube may be inserted into one of your veins.  You may receive contrast through this tube. A contrast is an injection that improves the quality of the pictures from your heart.  A gel will be applied to your chest.  A wand-like tool (transducer) will be moved over your chest. The gel will help to transmit the sound waves from the transducer.  The sound waves will harmlessly bounce off of your heart to allow the heart images to be captured in real-time motion. The images will be recorded on a computer. The procedure may vary among health care providers and hospitals. What happens after the procedure?  You may return  to your normal, everyday life, including diet, activities, and medicines, unless your health care provider tells you not to do that. Summary  An echocardiogram is a procedure that uses painless sound waves (ultrasound) to produce an image of the heart.  Images from an echocardiogram can provide important information about the size and shape of your  heart, heart muscle function, heart valve function, and fluid buildup around your heart.  You do not need to do anything to prepare before this procedure. You may eat and drink normally.  After the echocardiogram is completed, you may return to your normal, everyday life, unless your health care provider tells you not to do that. This information is not intended to replace advice given to you by your health care provider. Make sure you discuss any questions you have with your health care provider. Document Revised: 06/08/2018 Document Reviewed: 03/20/2016 Elsevier Patient Education  [place].
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
## 2019-05-23 ENCOUNTER — Ambulatory Visit (INDEPENDENT_AMBULATORY_CARE_PROVIDER_SITE_OTHER): Payer: No Typology Code available for payment source

## 2019-05-23 ENCOUNTER — Other Ambulatory Visit: Payer: Self-pay

## 2019-05-23 DIAGNOSIS — R002 Palpitations: Secondary | ICD-10-CM

## 2019-05-23 DIAGNOSIS — R011 Cardiac murmur, unspecified: Secondary | ICD-10-CM

## 2019-05-23 DIAGNOSIS — Z801 Family history of malignant neoplasm of trachea, bronchus and lung: Secondary | ICD-10-CM

## 2019-05-23 NOTE — Progress Notes (Signed)
Complete echocardiogram has been performed.  Jimmy Primus Gritton RDCS, RVT 

## 2019-06-27 ENCOUNTER — Other Ambulatory Visit: Payer: No Typology Code available for payment source

## 2019-06-27 ENCOUNTER — Ambulatory Visit: Payer: No Typology Code available for payment source | Admitting: Hematology

## 2019-07-19 ENCOUNTER — Encounter: Payer: Self-pay | Admitting: Cardiology

## 2019-07-19 ENCOUNTER — Ambulatory Visit (INDEPENDENT_AMBULATORY_CARE_PROVIDER_SITE_OTHER): Payer: No Typology Code available for payment source | Admitting: Cardiology

## 2019-07-19 ENCOUNTER — Other Ambulatory Visit: Payer: Self-pay

## 2019-07-19 DIAGNOSIS — R002 Palpitations: Secondary | ICD-10-CM | POA: Diagnosis not present

## 2019-07-19 NOTE — Progress Notes (Signed)
Cardiology Office Note:    Date:  07/19/2019   ID:  Andres Wallace, DOB 10/19/1980, MRN [id number]  PCP:  [name], MD  Cardiologist:  Jenean Lindau, MD   Referring MD: [name], MD    ASSESSMENT:    1. Palpitations    PLAN:    In order of problems listed above:  1. Primary prevention stressed with the patient.  Importance of compliance with diet and medication stressed and he vocalized understanding.  His echocardiogram is unremarkable and I discussed this report with him. 2. Palpitations these have pretty much resolved and he is happy with it.  He exercises vigorously. 3. Cardio stratification: Patient desires blood work for lipid check and calcium scoring and we will oblige him for this.Patient will be seen in follow-up appointment in 6 months or earlier if the patient has any concerns    Medication Adjustments/Labs and Tests Ordered: Current medicines are reviewed at length with the patient today.  Concerns regarding medicines are outlined above.  No orders of the defined types were placed in this encounter.  No orders of the defined types were placed in this encounter.    Chief Complaint  Patient presents with  . Follow-up     History of Present Illness:    Andres Wallace is a 39 y.o. male.  Patient was evaluated by me for palpitations.  His event monitor report is still to be obtained by me.  Echocardiogram was unremarkable.  He is exercising very vigorously without any symptoms.  No chest pain orthopnea or PND.  At the time of my evaluation, the patient is alert awake oriented and in no distress.  Past Medical History:  Diagnosis Date  . Colon cancer ([place]) dx'd 09/2016  . Family history of lung cancer   . Right inguinal hernia   . Seizures ([place])    3 seizures 2016    Past Surgical History:  Procedure Laterality Date  . APPENDECTOMY  2006  . COLON SURGERY     for colon cancer  . HERNIA REPAIR    . INGUINAL HERNIA REPAIR  10/15/2011   Procedure: HERNIA REPAIR INGUINAL ADULT;  Surgeon: Odis Hollingshead, MD;  [facility];  Service: General;  Laterality: Right;  with mesh   . left knee arthroscopy    . PORT-A-CATH REMOVAL Right 07/07/2017   Procedure: REMOVAL PORT-A-CATH;  Surgeon: Leighton Ruff, MD;  [facility];  Service: General;  Laterality: Right;  . PORTACATH PLACEMENT Right 12/01/2016   Procedure: INSERTION PORT-A-CATH;  Surgeon: Leighton Ruff, MD;  [facility];  Service: General;  Laterality: Right;  . PROCTOSCOPY  11/10/2016   Procedure: PROCTOSCOPY;  Surgeon: Leighton Ruff, MD;  [facility];  Service: General;;  . VASECTOMY  10/15/2011   Procedure: VASECTOMY;  Surgeon: Claybon Jabs, MD;  [facility];  Service: Urology;  Laterality: N/A;    Current Medications: Current Meds  Medication Sig  . LamoTRIgine XR 200 MG TB24 Take 100 mg by mouth at bedtime. Pt states trying to wean off  . MILK THISTLE PO Take 1 tablet by mouth 2 (two) times daily.  . Omega-3 Fatty Acids (FISH OIL PO) Take 2 capsules by mouth daily.  Marland Kitchen OVER THE COUNTER MEDICATION Take 2 capsules by mouth at bedtime. [place] TAIL MUSHROOM SUPPLEMENT  . Tragacanth (ASTRAGALUS ROOT) POWD Take 2 tablets by mouth at bedtime.  . TURMERIC PO Take 1 capsule by mouth 2 (two) times daily.  Allergies:   Patient has no known allergies.   Social History   Socioeconomic History  . Marital status: Married    Spouse name: Not on file  . Number of children: Not on file  . Years of education: Not on file  . Highest education level: Not on file  Occupational History  . Not on file  Tobacco Use  . Smoking status: Never Smoker  . Smokeless tobacco: Never Used  Substance and Sexual Activity  . Alcohol use: Not Currently    Comment: rarely  . Drug use: No  . Sexual activity: Yes  Other Topics Concern  . Not on file  Social History Narrative  . Not on file   Social Determinants of Health   Financial Resource Strain:   .  Difficulty of Paying Living Expenses:   Food Insecurity:   . Worried About Charity fundraiser in the Last Year:   . Arboriculturist in the Last Year:   Transportation Needs:   . Film/video editor (Medical):   Marland Kitchen Lack of Transportation (Non-Medical):   Physical Activity:   . Days of Exercise per Week:   . Minutes of Exercise per Session:   Stress:   . Feeling of Stress :   Social Connections:   . Frequency of Communication with Friends and Family:   . Frequency of Social Gatherings with Friends and Family:   . Attends Religious Services:   . Active Member of Clubs or Organizations:   . Attends Archivist Meetings:   Marland Kitchen Marital Status:      Family History: The patient's family history includes Heart attack (age of onset: 55) in his maternal grandmother; Lung cancer in his paternal grandfather; Lung cancer (age of onset: 80) in his father.  ROS:   Please see the history of present illness.    All other systems reviewed and are negative.  EKGs/Labs/Other Studies Reviewed:    The following studies were reviewed today: IMPRESSIONS    1. Left ventricular ejection fraction, by estimation, is 60 to 65%. The  left ventricle has normal function. The left ventricle has no regional  wall motion abnormalities. There is mild concentric left ventricular  hypertrophy. Left ventricular diastolic  parameters were normal.  2. Right ventricular systolic function is normal. The right ventricular  size is normal. There is normal pulmonary artery systolic pressure.  3. The mitral valve is normal in structure. Trivial mitral valve  regurgitation. No evidence of mitral stenosis.  4. Tricuspid valve regurgitation is mild to moderate.  5. The aortic valve is normal in structure. Aortic valve regurgitation is  not visualized. No aortic stenosis is present.  6. The inferior vena cava is normal in size with greater than 50%  respiratory variability, suggesting right atrial  pressure of 3 mmHg.    Recent Labs: 03/29/2019: ALT 22; BUN 14; Creatinine, Ser 1.10; Hemoglobin 14.9; Platelets 178; Potassium 4.1; Sodium 142  Recent Lipid Panel No results found for: CHOL, TRIG, HDL, CHOLHDL, VLDL, LDLCALC, LDLDIRECT  Physical Exam:    VS:  BP 120/72   Pulse 73   Ht 6' (1.829 m)   Wt 189 lb (85.7 kg)   SpO2 98%   BMI 25.63 kg/m     Wt Readings from Last 3 Encounters:  07/19/19 189 lb (85.7 kg)  04/09/19 190 lb (86.2 kg)  03/29/19 187 lb (84.8 kg)     GEN: Patient is in no acute distress HEENT: Normal NECK: No JVD; No  carotid bruits LYMPHATICS: No lymphadenopathy CARDIAC: Hear sounds regular, 2/6 systolic murmur at the apex. RESPIRATORY:  Clear to auscultation without rales, wheezing or rhonchi  ABDOMEN: Soft, non-tender, non-distended MUSCULOSKELETAL:  No edema; No deformity  SKIN: Warm and dry NEUROLOGIC:  Alert and oriented x 3 PSYCHIATRIC:  Normal affect   Signed, Jenean Lindau, MD  07/19/2019 4:57 PM    [facility]

## 2019-07-19 NOTE — Patient Instructions (Signed)
Medication Instructions:  No medication changes. *If you need a refill on your cardiac medications before your next appointment, please call your pharmacy*   Lab Work: Your physician recommends that you return for lab work in: the next few days. You need to have labs done when you are fasting.  You can come Monday through Friday 8:30 am to 12:00 pm and 1:15 to 4:30. You do not need to make an appointment as the order has already been placed. The labs you are going to have done are BMET, LFT and Lipids.  If you have labs (blood work) drawn today and your tests are completely normal, you will receive your results only by: Marland Kitchen MyChart Message (if you have MyChart) OR . A paper copy in the mail If you have any lab test that is abnormal or we need to change your treatment, we will call you to review the results.   Testing/Procedures: We will order CT coronary calcium score $150  Please call [phone] to schedule   [facility]  [address], [place] [address]    Follow-Up: At [facility], you and your health needs are our priority.  As part of our continuing mission to provide you with exceptional heart care, we have created designated Provider Care Teams.  These Care Teams include your primary Cardiologist (physician) and Advanced Practice Providers (APPs -  Physician Assistants and Nurse Practitioners) who all work together to provide you with the care you need, when you need it.  We recommend signing up for the patient portal called "MyChart".  Sign up information is provided on this After Visit Summary.  MyChart is used to connect with patients for Virtual Visits (Telemedicine).  Patients are able to view lab/test results, encounter notes, upcoming appointments, etc.  Non-urgent messages can be sent to your provider as well.   To learn more about what you can do with MyChart, go to [email or web address].    Your next appointment:   6 month(s)  The format  for your next appointment:   In Person  Provider:   Jyl Heinz, MD   Other Instructions  Coronary Calcium Scan A coronary calcium scan is an imaging test used to look for deposits of plaque in the inner lining of the blood vessels of the heart (coronary arteries). Plaque is made up of calcium, protein, and fatty substances. These deposits of plaque can partly clog and narrow the coronary arteries without producing any symptoms or warning signs. This puts a person at risk for a heart attack. This test is recommended for people who are at moderate risk for heart disease. The test can find plaque deposits before symptoms develop. Tell a health care provider about:  Any allergies you have.  All medicines you are taking, including vitamins, herbs, eye drops, creams, and over-the-counter medicines.  Any problems you or family members have had with anesthetic medicines.  Any blood disorders you have.  Any surgeries you have had.  Any medical conditions you have.  Whether you are pregnant or may be pregnant. What are the risks? Generally, this is a safe procedure. However, problems may occur, including:  Harm to a pregnant woman and her unborn baby. This test involves the use of radiation. Radiation exposure can be dangerous to a pregnant woman and her unborn baby. If you are pregnant or think you may be pregnant, you should not have this procedure done.  Slight increase in the risk of cancer. This is because  of the radiation involved in the test. What happens before the procedure? Ask your health care provider for any specific instructions on how to prepare for this procedure. You may be asked to avoid products that contain caffeine, tobacco, or nicotine for 4 hours before the procedure. What happens during the procedure?   You will undress and remove any jewelry from your neck or chest.  You will put on a hospital gown.  Sticky electrodes will be placed on your chest. The  electrodes will be connected to an electrocardiogram (ECG) machine to record a tracing of the electrical activity of your heart.  You will lie down on a curved bed that is attached to the [facility].  You may be given medicine to slow down your heart rate so that clear pictures can be created.  You will be moved into the CT scanner, and the CT scanner will take pictures of your heart. During this time, you will be asked to lie still and hold your breath for 2-3 seconds at a time while each picture of your heart is being taken. The procedure may vary among health care providers and hospitals. What happens after the procedure?  You can get dressed.  You can return to your normal activities.  It is up to you to get the results of your procedure. Ask your health care provider, or the department that is doing the procedure, when your results will be ready. Summary  A coronary calcium scan is an imaging test used to look for deposits of plaque in the inner lining of the blood vessels of the heart (coronary arteries). Plaque is made up of calcium, protein, and fatty substances.  Generally, this is a safe procedure. Tell your health care provider if you are pregnant or may be pregnant.  Ask your health care provider for any specific instructions on how to prepare for this procedure.  A CT scanner will take pictures of your heart.  You can return to your normal activities after the scan is done. This information is not intended to replace advice given to you by your health care provider. Make sure you discuss any questions you have with your health care provider. Document Revised: 09/05/2018 Document Reviewed: 09/05/2018 Elsevier Patient Education  [place].

## 2019-07-25 NOTE — Progress Notes (Signed)
[facility]   [phone] [phone]   Clinic Follow up Note   Patient Care Team: [name], MD as PCP - General (Family Medicine) Nehemiah Settle, MD (Gastroenterology) Leighton Ruff, MD as Consulting Physician (General Surgery) Truitt Merle, MD as Consulting Physician (Hematology)  Date of Service:  07/27/2019  CHIEF COMPLAINT: F/u of left colon cancer  SUMMARY OF ONCOLOGIC HISTORY: Oncology History Overview Note  Cancer Staging Colon cancer [facility]) Staging form: Colon and Rectum, AJCC 8th Edition - Pathologic stage from 11/10/2016: Stage IIIB (pT3, pN2a, cM0) - Signed by Truitt Merle, MD on 11/22/2016     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 Melina Copa: There is an obviously malignant mass at 28 cm in the sigmoid colon. He was circumferential and the colonoscopy could not be passed through the lesions. Multiple biopsies taken.   10/01/2016 Initial Biopsy   Sigmoid colon mass biopsy showed invasive adenocarcinoma.   10/04/2016 Imaging   CT abdomen/pelvis - no signs of metastatic disease, several enlarged mesenteric lymph nodes   10/13/2016 Imaging   CT chest, abdomen and the pelvis with contrast and showed no evidence of distant recurrence.    10/21/2016 Genetic Testing   The patient had genetic testing due to a personal history of colon cancer at 67.  He was tested for the Common Hereditary Cancer Panel.  The Hereditary Gene Panel offered by [facility] includes sequencing and/or deletion duplication testing of the following 46 genes: APC, ATM, AXIN2, BARD1, BMPR1A, BRCA1, BRCA2, BRIP1, CDH1, CDKN2A (p14ARF), CDKN2A (p16INK4a), CHEK2, CTNNA1, DICER1, EPCAM (Deletion/duplication testing only), GREM1 (promoter region deletion/duplication testing only), KIT, MEN1, MLH1, MSH2, MSH3, MSH6, MUTYH, NBN, NF1, NHTL1, PALB2, PDGFRA, PMS2,  POLD1, POLE, PTEN, RAD50, RAD51C, RAD51D, SDHB, SDHC, SDHD, SMAD4, SMARCA4. STK11, TP53, TSC1, TSC2, and VHL.  The following genes were evaluated for sequence changes only: SDHA and HOXB13 c.251G>A variant only.  Results: No pathogenic mutations identified.  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  The date of this test report is 10/21/2016.    11/10/2016 Pathology Results   Diagnosis- surgical pathology Colon, segmental resection for tumor, rectosigmoid ADENOCARCINOMA OF THE COLON, GRADE 3 (4.5 CM) THE TUMOR INVADES THROUGH THE MUSCULARIS PROPRIA INTO PERICOLONIC SOFT TISSUE (PT3) LYMPHOVASCULAR INVASION IS IDENTIFIED MARGINS OF RESECTION ARE NEGATIVE FOR CARCINOMA METASTATIC ADENOCARCINOMA IN FOUR OF TWENTY-TWO LYMPH NODES (4/22, PN2A)   11/10/2016 Surgery    XI ROBOT ASSISTED SIGMOIDECTOMY  SURGEON:  Surgeon(s): Garlin Boston, MD Leighton Ruff, MD    9/52/8413 Miscellaneous   Colon cancer MSI-stable    12/06/2016 - 05/23/2017 Chemotherapy   CAPOX every 3 weeks for 6 months    06/24/2017 Imaging   CT CAP W Contrast 06/24/17 IMPRESSION: 1. Status post surgical resection of a sigmoid colon cancer. No CT findings suspicious for residual tumor, recurrent tumor, regional adenopathy or distant metastatic disease. 2. No acute abdominal findings.   09/30/2017 Procedure   Colonoscopy -Inflamed hyperplastic polyp -No dysplasia or malignancy   01/16/2018 Imaging   01/16/2018 CT CAP IMPRESSION: 1. Questionable new low-attenuation focus in the posterior right liver dome. This may be artifactual due to contrast timing. MRI abdomen without and with IV contrast recommended to exclude a true liver lesion. 2. No additional potential findings of metastatic disease in the chest, abdomen or pelvis. 3. No evidence of recurrent  tumor at the distal colonic anastomosis.   11/01/2018 Imaging   CT CAP W Contrast  IMPRESSION: No evidence of  metastatic disease.      CURRENT THERAPY:  Surveillance  INTERVAL HISTORY:  Andres Wallace is here for a follow up of colon cancer. Her was last seen by me 4 months ago. He presents to the clinic alone. He notes he is doing well. He denies abdominal pain or bloating and he is eating well. His weight is stable and he has normal BM. He notes he has not received his COVID19 vaccine.     REVIEW OF SYSTEMS:   Constitutional: Denies fevers, chills or abnormal weight loss Eyes: Denies blurriness of vision Ears, nose, mouth, throat, and face: Denies mucositis or sore throat Respiratory: Denies cough, dyspnea or wheezes Cardiovascular: Denies palpitation, chest discomfort or lower extremity swelling Gastrointestinal:  Denies nausea, heartburn or change in bowel habits Skin: Denies abnormal skin rashes Lymphatics: Denies new lymphadenopathy or easy bruising Neurological:Denies numbness, tingling or new weaknesses Behavioral/Psych: Mood is stable, no new changes  All other systems were reviewed with the patient and are negative.  MEDICAL HISTORY:  Past Medical History:  Diagnosis Date  . Colon cancer ([place]) dx'd 09/2016  . Family history of lung cancer   . Right inguinal hernia   . Seizures ([place])    3 seizures 2016    SURGICAL HISTORY: Past Surgical History:  Procedure Laterality Date  . APPENDECTOMY  2006  . COLON SURGERY     for colon cancer  . HERNIA REPAIR    . INGUINAL HERNIA REPAIR  10/15/2011   Procedure: HERNIA REPAIR INGUINAL ADULT;  Surgeon: Odis Hollingshead, MD;  Location: [facility];  Service: General;  Laterality: Right;  with mesh   . left knee arthroscopy    . PORT-A-CATH REMOVAL Right 07/07/2017   Procedure: REMOVAL PORT-A-CATH;  Surgeon: Leighton Ruff, MD;  Location: [facility];  Service: General;  Laterality: Right;  . PORTACATH PLACEMENT Right 12/01/2016   Procedure: INSERTION PORT-A-CATH;  Surgeon: Leighton Ruff, MD;  Location: [facility];  Service: General;  Laterality:  Right;  . PROCTOSCOPY  11/10/2016   Procedure: PROCTOSCOPY;  Surgeon: Leighton Ruff, MD;  Location: [facility];  Service: General;;  . VASECTOMY  10/15/2011   Procedure: VASECTOMY;  Surgeon: Claybon Jabs, MD;  Location: [facility];  Service: Urology;  Laterality: N/A;    I have reviewed the social history and family history with the patient and they are unchanged from previous note.  ALLERGIES:  has No Known Allergies.  MEDICATIONS:  Current Outpatient Medications  Medication Sig Dispense Refill  . LamoTRIgine XR 200 MG TB24 Take 100 mg by mouth at bedtime. Pt states trying to wean off  5  . MILK THISTLE PO Take 1 tablet by mouth 2 (two) times daily.    . Omega-3 Fatty Acids (FISH OIL PO) Take 2 capsules by mouth daily.    Marland Kitchen OVER THE COUNTER MEDICATION Take 2 capsules by mouth at bedtime. [place] TAIL MUSHROOM SUPPLEMENT    . Tragacanth (ASTRAGALUS ROOT) POWD Take 2 tablets by mouth at bedtime.    . TURMERIC PO Take 1 capsule by mouth 2 (two) times daily.     No current facility-administered medications for this visit.    PHYSICAL EXAMINATION: ECOG PERFORMANCE STATUS: 0 - Asymptomatic  Vitals:   07/27/19 0937  BP: 129/71  Pulse: 69  Resp: 17  Temp: 98.1 F (36.7 C)  SpO2: 100%   Filed Weights  07/27/19 0937  Weight: 189 lb 11.2 oz (86 kg)    GENERAL:alert, no distress and comfortable SKIN: skin color, texture, turgor are normal, no rashes or significant lesions EYES: normal, Conjunctiva are pink and non-injected, sclera clear  NECK: supple, thyroid normal size, non-tender, without nodularity LYMPH:  no palpable lymphadenopathy in the cervical, axillary  LUNGS: clear to auscultation and percussion with normal breathing effort HEART: regular rate & rhythm and no murmurs and no lower extremity edema ABDOMEN:abdomen soft, non-tender and normal bowel sounds Musculoskeletal:no cyanosis of digits and no clubbing  NEURO: alert & oriented x 3 with fluent speech, no focal  motor/sensory deficits  LABORATORY DATA:  I have reviewed the data as listed CBC Latest Ref Rng & Units 07/27/2019 03/29/2019 11/01/2018  WBC 4.0 - 10.5 K/uL 7.2 7.8 6.5  Hemoglobin 13.0 - 17.0 g/dL 15.1 14.9 15.6  Hematocrit 39.0 - 52.0 % 43.6 43.4 44.8  Platelets 150 - 400 K/uL 158 178 167     CMP Latest Ref Rng & Units 07/27/2019 03/29/2019 11/01/2018  Glucose 70 - 99 mg/dL 93 68(L) 90  BUN 6 - 20 mg/dL 18 14 13   Creatinine 0.61 - 1.24 mg/dL 1.04 1.10 1.04  Sodium 135 - 145 mmol/L 141 142 140  Potassium 3.5 - 5.1 mmol/L 4.0 4.1 4.9  Chloride 98 - 111 mmol/L 105 105 105  CO2 22 - 32 mmol/L 26 29 26   Calcium 8.9 - 10.3 mg/dL 9.4 9.4 9.3  Total Protein 6.5 - 8.1 g/dL 7.2 7.1 7.0  Total Bilirubin 0.3 - 1.2 mg/dL 0.6 0.7 0.6  Alkaline Phos 38 - 126 U/L 78 79 66  AST 15 - 41 U/L 23 21 23   ALT 0 - 44 U/L 21 22 21       RADIOGRAPHIC STUDIES: I have personally reviewed the radiological images as listed and agreed with the findings in the report. No results found.   ASSESSMENT & PLAN:  Andres Wallace is a 39 y.o. male with    1. Left colon cancer,sigmoid colon, invasive adenocarcinoma, G3, pT3N2M0, stage IIIB, MSI stable -He was diagnosed in 09/2016. He is s/pcomplete surgical resection with negative marginsand adjuvant CAPOX.  -Wepreviouslydiscussed cancer surveillance. Risk of recurrence reduces significantly after the first 2-3 years. Will continue to follow him for a total of 5 years with follow ups, labs and physical exam.  surveillanceColonoscopy done on 09/30/2017 was negative for malignancy. -He is clinically doing well. Lab reviewed, her CBC and CMP are within normal limits. CEA is still pending. His physical exam unremarkable. There is no clinical concern for recurrence. -I discussed the [place] which is a blood test for circulating colon cancer cells and it fragments. It has very high sensitivity to predict cancer recurrence. I discussed given he is post  treatment, if positive, I would scan him sooner. He will think about it and wait until next scan, he is concerned about test related anxiety.   -He is almost 3 years since his cancer diagnosis. His risk of recurrence decreases the further from diagnosis. Continue Surveillance. Next scan in 10/2019. This is likely last routine scan  -F/u in 4 months with scan.  -I recommend he receive COVDI19 vaccine but if he does not proceed I recommend he continue COVID19 precautions.    2. History of seizure  -Continue Lamictal and a follow-up with his neurologist at [facility]. -His seizure episodes were 4 years ago (2016). No repeat episodes since then. He and his neurologist are weaning him off Lamictal.  PLAN: -He is clinically doing well -Virtual Visit in 4 months with lab and CT CAP W contrast a few days before    No problem-specific Assessment & Plan notes found for this encounter.   Orders Placed This Encounter  Procedures  . CT Abdomen Pelvis W Contrast    Standing Status:   Future    Standing Expiration Date:   07/26/2020    Order Specific Question:   If indicated for the ordered procedure, I authorize the administration of contrast media per Radiology protocol    Answer:   Yes    Order Specific Question:   Preferred imaging location?    Answer:   [facility]    Order Specific Question:   Release to patient    Answer:   Immediate    Order Specific Question:   Is Oral Contrast requested for this exam?    Answer:   Yes, Per Radiology protocol    Order Specific Question:   Radiology Contrast Protocol - do NOT remove file path    Answer:   \\[email or web address]  . CT Chest W Contrast    Standing Status:   Future    Standing Expiration Date:   07/26/2020    Order Specific Question:   If indicated for the ordered procedure, I authorize the administration of contrast media per Radiology protocol    Answer:   Yes    Order Specific Question:   Preferred  imaging location?    Answer:   [facility]    Order Specific Question:   Radiology Contrast Protocol - do NOT remove file path    Answer:   \\[email or web address]   All questions were answered. The patient knows to call the clinic with any problems, questions or concerns. No barriers to learning was detected. The total time spent in the appointment was 30 minutes.     Truitt Merle, MD 07/27/2019   I, Joslyn Devon, am acting as scribe for Truitt Merle, MD.   I have reviewed the above documentation for accuracy and completeness, and I agree with the above.

## 2019-07-27 ENCOUNTER — Inpatient Hospital Stay: Payer: PRIVATE HEALTH INSURANCE | Attending: Hematology

## 2019-07-27 ENCOUNTER — Inpatient Hospital Stay (HOSPITAL_BASED_OUTPATIENT_CLINIC_OR_DEPARTMENT_OTHER): Payer: PRIVATE HEALTH INSURANCE | Admitting: Hematology

## 2019-07-27 ENCOUNTER — Encounter: Payer: Self-pay | Admitting: Hematology

## 2019-07-27 ENCOUNTER — Other Ambulatory Visit: Payer: Self-pay

## 2019-07-27 VITALS — BP 129/71 | HR 69 | Temp 98.1°F | Resp 17 | Ht 72.0 in | Wt 189.7 lb

## 2019-07-27 DIAGNOSIS — K4090 Unilateral inguinal hernia, without obstruction or gangrene, not specified as recurrent: Secondary | ICD-10-CM | POA: Diagnosis not present

## 2019-07-27 DIAGNOSIS — Z85038 Personal history of other malignant neoplasm of large intestine: Secondary | ICD-10-CM | POA: Diagnosis present

## 2019-07-27 DIAGNOSIS — C187 Malignant neoplasm of sigmoid colon: Secondary | ICD-10-CM

## 2019-07-27 DIAGNOSIS — G40909 Epilepsy, unspecified, not intractable, without status epilepticus: Secondary | ICD-10-CM | POA: Insufficient documentation

## 2019-07-27 DIAGNOSIS — Z9221 Personal history of antineoplastic chemotherapy: Secondary | ICD-10-CM | POA: Diagnosis not present

## 2019-07-27 DIAGNOSIS — Z801 Family history of malignant neoplasm of trachea, bronchus and lung: Secondary | ICD-10-CM | POA: Diagnosis not present

## 2019-07-27 DIAGNOSIS — Z79899 Other long term (current) drug therapy: Secondary | ICD-10-CM | POA: Insufficient documentation

## 2019-07-27 DIAGNOSIS — C772 Secondary and unspecified malignant neoplasm of intra-abdominal lymph nodes: Secondary | ICD-10-CM

## 2019-07-27 LAB — CBC WITH DIFFERENTIAL/PLATELET
Abs Immature Granulocytes: 0.01 10*3/uL (ref 0.00–0.07)
Basophils Absolute: 0 10*3/uL (ref 0.0–0.1)
Basophils Relative: 0 %
Eosinophils Absolute: 0.3 10*3/uL (ref 0.0–0.5)
Eosinophils Relative: 4 %
HCT: 43.6 % (ref 39.0–52.0)
Hemoglobin: 15.1 g/dL (ref 13.0–17.0)
Immature Granulocytes: 0 %
Lymphocytes Relative: 31 %
Lymphs Abs: 2.2 10*3/uL (ref 0.7–4.0)
MCH: 31.1 pg (ref 26.0–34.0)
MCHC: 34.6 g/dL (ref 30.0–36.0)
MCV: 89.7 fL (ref 80.0–100.0)
Monocytes Absolute: 0.4 10*3/uL (ref 0.1–1.0)
Monocytes Relative: 6 %
Neutro Abs: 4.2 10*3/uL (ref 1.7–7.7)
Neutrophils Relative %: 59 %
Platelets: 158 10*3/uL (ref 150–400)
RBC: 4.86 MIL/uL (ref 4.22–5.81)
RDW: 11.4 % — ABNORMAL LOW (ref 11.5–15.5)
WBC: 7.2 10*3/uL (ref 4.0–10.5)
nRBC: 0 % (ref 0.0–0.2)

## 2019-07-27 LAB — COMPREHENSIVE METABOLIC PANEL
ALT: 21 U/L (ref 0–44)
AST: 23 U/L (ref 15–41)
Albumin: 4.2 g/dL (ref 3.5–5.0)
Alkaline Phosphatase: 78 U/L (ref 38–126)
Anion gap: 10 (ref 5–15)
BUN: 18 mg/dL (ref 6–20)
CO2: 26 mmol/L (ref 22–32)
Calcium: 9.4 mg/dL (ref 8.9–10.3)
Chloride: 105 mmol/L (ref 98–111)
Creatinine, Ser: 1.04 mg/dL (ref 0.61–1.24)
GFR calc Af Amer: >60 mL/min (ref >60)
GFR calc non Af Amer: >60 mL/min (ref >60)
Glucose, Bld: 93 mg/dL (ref 70–99)
Potassium: 4 mmol/L (ref 3.5–5.1)
Sodium: 141 mmol/L (ref 135–145)
Total Bilirubin: 0.6 mg/dL (ref 0.3–1.2)
Total Protein: 7.2 g/dL (ref 6.5–8.1)

## 2019-07-27 LAB — CEA (IN HOUSE-CHCC): CEA (CHCC-In House): 2.02 ng/mL (ref 0.00–5.00)

## 2019-07-31 ENCOUNTER — Telehealth: Payer: Self-pay | Admitting: Hematology

## 2019-07-31 NOTE — Telephone Encounter (Signed)
Called pt to schedule appts per 5/28 los.  Patient stated he will call within a few weeks to schedule the appts due to him possibly being out of town, the day I was going to schedule the appts

## 2019-09-04 ENCOUNTER — Telehealth: Payer: Self-pay | Admitting: Cardiology

## 2019-09-04 NOTE — Telephone Encounter (Signed)
Patient returning Donna's call regarding scheduling a CT. Transferred call to Butch Penny to schedule.

## 2019-09-14 ENCOUNTER — Other Ambulatory Visit: Payer: Self-pay

## 2019-09-14 ENCOUNTER — Ambulatory Visit (INDEPENDENT_AMBULATORY_CARE_PROVIDER_SITE_OTHER)
Admission: RE | Admit: 2019-09-14 | Discharge: 2019-09-14 | Disposition: A | Discharge status: Home / Self Care | Payer: Self-pay | Source: Ambulatory Visit | Attending: Cardiology | Admitting: Cardiology

## 2019-09-14 DIAGNOSIS — R002 Palpitations: Secondary | ICD-10-CM

## 2020-01-01 ENCOUNTER — Encounter: Payer: Self-pay | Admitting: Hematology

## 2020-01-02 ENCOUNTER — Ambulatory Visit (HOSPITAL_COMMUNITY)
Admission: RE | Admit: 2020-01-02 | Discharge: 2020-01-02 | Disposition: A | Discharge status: Home / Self Care | Payer: Self-pay | Source: Ambulatory Visit | Attending: Hematology | Admitting: Hematology

## 2020-01-02 ENCOUNTER — Encounter (HOSPITAL_COMMUNITY): Payer: Self-pay

## 2020-01-02 ENCOUNTER — Telehealth: Payer: Self-pay | Admitting: Hematology

## 2020-01-02 ENCOUNTER — Other Ambulatory Visit: Payer: Self-pay

## 2020-01-02 DIAGNOSIS — C187 Malignant neoplasm of sigmoid colon: Secondary | ICD-10-CM | POA: Insufficient documentation

## 2020-01-02 DIAGNOSIS — C772 Secondary and unspecified malignant neoplasm of intra-abdominal lymph nodes: Secondary | ICD-10-CM | POA: Insufficient documentation

## 2020-01-02 MED ORDER — IOHEXOL 300 MG/ML  SOLN
100.0000 mL | Freq: Once | INTRAMUSCULAR | Status: AC | PRN
  Administered 2020-01-02: 100 mL via INTRAVENOUS

## 2020-01-02 NOTE — Progress Notes (Signed)
[place]   [phone] [phone]   Clinic Follow up Note   Patient Care Team: [name], MD as PCP - General (Family Medicine) Nehemiah Settle, MD (Gastroenterology) Leighton Ruff, MD as Consulting Physician (General Surgery) Truitt Merle, MD as Consulting Physician (Hematology)   I connected with Andres Wallace on 01/03/2020 at 11:20 AM EDT by video enabled telemedicine visit and verified that I am speaking with the correct person using two identifiers.  I discussed the limitations, risks, security and privacy concerns of performing an evaluation and management service by telephone and the availability of in person appointments. I also discussed with the patient that there may be a patient responsible charge related to this service. The patient expressed understanding and agreed to proceed.   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 Melina Copa: There is an obviously malignant mass at 28 cm in the sigmoid colon. He was circumferential and the colonoscopy could not be passed through the lesions. Multiple biopsies taken.   10/01/2016 Initial Biopsy   Sigmoid colon mass biopsy showed invasive adenocarcinoma.   10/04/2016 Imaging   CT abdomen/pelvis - no signs of metastatic disease, several enlarged mesenteric lymph nodes   10/13/2016 Imaging   CT chest, abdomen and the pelvis with contrast and  showed no evidence of distant recurrence.    10/21/2016 Genetic Testing   The patient had genetic testing due to a personal history of colon cancer at 26.  He was tested for the Common Hereditary Cancer Panel.  The Hereditary Gene Panel offered by [facility] includes sequencing and/or deletion duplication testing of the following 46 genes: APC, ATM, AXIN2, BARD1, BMPR1A, BRCA1, BRCA2, BRIP1, CDH1, CDKN2A (p14ARF), CDKN2A (p16INK4a), CHEK2, CTNNA1, DICER1, EPCAM (Deletion/duplication testing only), GREM1 (promoter region deletion/duplication testing only), KIT, MEN1, MLH1, MSH2, MSH3, MSH6, MUTYH, NBN, NF1, NHTL1, PALB2, PDGFRA, PMS2, POLD1, POLE, PTEN, RAD50, RAD51C, RAD51D, SDHB, SDHC, SDHD, SMAD4, SMARCA4. STK11, TP53, TSC1, TSC2, and VHL.  The following genes were evaluated for sequence changes only: SDHA and HOXB13 c.251G>A variant only.  Results: No pathogenic mutations identified.  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  The date of this test report is 10/21/2016.    11/10/2016 Pathology Results   Diagnosis- surgical pathology Colon, segmental resection for tumor, rectosigmoid ADENOCARCINOMA OF THE COLON, GRADE 3 (4.5 CM) THE TUMOR INVADES THROUGH THE MUSCULARIS PROPRIA INTO PERICOLONIC SOFT TISSUE (PT3) LYMPHOVASCULAR INVASION IS IDENTIFIED MARGINS OF RESECTION ARE NEGATIVE FOR CARCINOMA METASTATIC ADENOCARCINOMA IN FOUR OF TWENTY-TWO LYMPH NODES (4/22, PN2A)   11/10/2016 Surgery    XI ROBOT ASSISTED SIGMOIDECTOMY  SURGEON:  Surgeon(s): Jeray Boston, MD Leighton Ruff, MD    7/61/6073 Miscellaneous   Colon cancer MSI-stable    12/06/2016 - 05/23/2017 Chemotherapy   CAPOX every 3 weeks for 6 months    06/24/2017 Imaging   CT CAP W Contrast 06/24/17 IMPRESSION:  1. Status post surgical resection of a sigmoid colon cancer. No CT findings suspicious for residual tumor, recurrent tumor, regional adenopathy or distant metastatic  disease. 2. No acute abdominal findings.   09/30/2017 Procedure   Colonoscopy -Inflamed hyperplastic polyp -No dysplasia or malignancy   01/16/2018 Imaging   01/16/2018 CT CAP IMPRESSION: 1. Questionable new low-attenuation focus in the posterior right liver dome. This may be artifactual due to contrast timing. MRI abdomen without and with IV contrast recommended to exclude a true liver lesion. 2. No additional potential findings of metastatic disease in the chest, abdomen or pelvis. 3. No evidence of recurrent tumor at the distal colonic anastomosis.   11/01/2018 Imaging   CT CAP W Contrast  IMPRESSION: No evidence of metastatic disease.   01/02/2020 Imaging   CT CAP  IMPRESSION: No evidence of recurrent or metastatic disease.      CURRENT THERAPY:  Surveillance  INTERVAL HISTORY:  [name] is scheduled for virtual visit to discuss his knees. He identified himself through face-to-face video. He is doing very well, denies any pain, abdominal discomfort, cough, dyspnea, or other symptoms. He has good appetite and is energy level. His weight is stable.  All other systems were reviewed with the patient and are negative.  MEDICAL HISTORY:  Past Medical History:  Diagnosis Date  . Colon cancer ([place]) dx'd 09/2016  . Family history of lung cancer   . Right inguinal hernia   . Seizures ([place])    3 seizures 2016    SURGICAL HISTORY: Past Surgical History:  Procedure Laterality Date  . APPENDECTOMY  2006  . COLON SURGERY     for colon cancer  . HERNIA REPAIR    . INGUINAL HERNIA REPAIR  10/15/2011   Procedure: HERNIA REPAIR INGUINAL ADULT;  Surgeon: Odis Hollingshead, MD;  Location: [facility];  Service: General;  Laterality: Right;  with mesh   . left knee arthroscopy    . PORT-A-CATH REMOVAL Right 07/07/2017   Procedure: REMOVAL PORT-A-CATH;  Surgeon: Leighton Ruff, MD;  Location: [facility];  Service: General;  Laterality: Right;  . PORTACATH PLACEMENT Right 12/01/2016    Procedure: INSERTION PORT-A-CATH;  Surgeon: Leighton Ruff, MD;  Location: [facility];  Service: General;  Laterality: Right;  . PROCTOSCOPY  11/10/2016   Procedure: PROCTOSCOPY;  Surgeon: Leighton Ruff, MD;  Location: [facility];  Service: General;;  . VASECTOMY  10/15/2011   Procedure: VASECTOMY;  Surgeon: Claybon Jabs, MD;  Location: [facility];  Service: Urology;  Laterality: N/A;    I have reviewed the social history and family history with the patient and they are unchanged from previous note.  ALLERGIES:  has No Known Allergies.  MEDICATIONS:  Current Outpatient Medications  Medication Sig Dispense Refill  . LamoTRIgine XR 200 MG TB24 Take 100 mg by mouth at bedtime. Pt states trying to wean off  5  . MILK THISTLE PO Take 1 tablet by mouth 2 (two) times daily.    . Omega-3 Fatty Acids (FISH OIL PO) Take 2 capsules by mouth daily.    Marland Kitchen OVER THE COUNTER MEDICATION Take 2 capsules by mouth at bedtime. [place] TAIL MUSHROOM SUPPLEMENT    . Tragacanth (ASTRAGALUS ROOT) POWD Take 2 tablets by mouth at bedtime.    . TURMERIC PO Take 1 capsule by mouth 2 (two) times daily.     No current facility-administered medications for this visit.    PHYSICAL EXAMINATION: ECOG PERFORMANCE STATUS: 0 - Asymptomatic  No vitals taken today,  Exam not performed today   LABORATORY DATA:  I have reviewed the data as listed CBC Latest Ref Rng & Units 07/27/2019 03/29/2019 11/01/2018  WBC 4.0 - 10.5 K/uL 7.2 7.8 6.5  Hemoglobin 13.0 - 17.0 g/dL 15.1 14.9 15.6  Hematocrit 39 - 52 % 43.6 43.4 44.8  Platelets 150 - 400 K/uL 158 178 167     CMP Latest Ref Rng & Units 07/27/2019 03/29/2019 11/01/2018  Glucose 70 - 99 mg/dL 93 68(L) 90  BUN 6 - 20 mg/dL 18 14 13   Creatinine 0.61 - 1.24 mg/dL 1.04 1.10 1.04  Sodium 135 - 145 mmol/L 141 142 140  Potassium 3.5 - 5.1 mmol/L 4.0 4.1 4.9  Chloride 98 - 111 mmol/L 105 105 105  CO2 22 - 32 mmol/L 26 29 26   Calcium 8.9 - 10.3 mg/dL 9.4 9.4 9.3  Total Protein 6.5 - 8.1 g/dL  7.2 7.1 7.0  Total Bilirubin 0.3 - 1.2 mg/dL 0.6 0.7 0.6  Alkaline Phos 38 - 126 U/L 78 79 66  AST 15 - 41 U/L 23 21 23   ALT 0 - 44 U/L 21 22 21       RADIOGRAPHIC STUDIES: I have personally reviewed the radiological images as listed and agreed with the findings in the report. CT Chest W Contrast  Result Date: 01/02/2020 CLINICAL DATA:  Colorectal cancer. EXAM: CT CHEST, ABDOMEN, AND PELVIS WITH CONTRAST TECHNIQUE: Multidetector CT imaging of the chest, abdomen and pelvis was performed following the standard protocol during bolus administration of intravenous contrast. CONTRAST:  166m OMNIPAQUE IOHEXOL 300 MG/ML  SOLN COMPARISON:  11/01/2018. FINDINGS: CT CHEST FINDINGS Cardiovascular: Heart size normal. No pericardial or pleural effusion. Mediastinum/Nodes: Triangular-shaped soft tissue in the prevascular space is indicative of thymus, unchanged. No pathologically enlarged mediastinal, hilar or axillary lymph nodes. Esophagus is unremarkable. Lungs/Pleura: Lungs are clear. No pleural fluid. Airway is unremarkable. Musculoskeletal: No worrisome lytic or sclerotic lesions. CT ABDOMEN PELVIS FINDINGS Hepatobiliary: Liver and gallbladder are unremarkable. No biliary ductal dilatation. Pancreas: Negative. Spleen: Negative. Adrenals/Urinary Tract: Adrenal glands and kidneys are unremarkable. Ureters are decompressed. Bladder is unremarkable. Stomach/Bowel: Stomach and small bowel are unremarkable. Appendectomy. Postoperative changes in the distal colon. Residual colon is unremarkable. Vascular/Lymphatic: Vascular structures are unremarkable. No pathologically enlarged lymph nodes. Reproductive: Prostate is visualized. Other: No free fluid.  Mesenteries and peritoneum are unremarkable. Musculoskeletal: Bilateral L5 pars defects without alignment abnormality. Probable bone island in the right femoral head, unchanged. No worrisome lytic or sclerotic lesions. IMPRESSION: No evidence of recurrent or metastatic  disease. Electronically Signed   By: MLorin PicketM.D.   On: 01/02/2020 09:23   CT Abdomen Pelvis W Contrast  Result Date: 01/02/2020 CLINICAL DATA:  Colorectal cancer. EXAM: CT CHEST, ABDOMEN, AND PELVIS WITH CONTRAST TECHNIQUE: Multidetector CT imaging of the chest, abdomen and pelvis was performed following the standard protocol during bolus administration of intravenous contrast. CONTRAST:  1029mOMNIPAQUE IOHEXOL 300 MG/ML  SOLN COMPARISON:  11/01/2018. FINDINGS: CT CHEST FINDINGS Cardiovascular: Heart size normal. No pericardial or pleural effusion. Mediastinum/Nodes: Triangular-shaped soft tissue in the prevascular space is indicative of thymus, unchanged. No pathologically enlarged mediastinal, hilar or axillary lymph nodes. Esophagus is unremarkable. Lungs/Pleura: Lungs are clear. No pleural fluid. Airway is unremarkable. Musculoskeletal: No worrisome lytic or sclerotic lesions. CT ABDOMEN PELVIS FINDINGS Hepatobiliary: Liver and gallbladder are unremarkable. No biliary ductal dilatation. Pancreas: Negative. Spleen: Negative. Adrenals/Urinary Tract: Adrenal glands and kidneys are unremarkable. Ureters are decompressed. Bladder is unremarkable. Stomach/Bowel: Stomach and small bowel are  unremarkable. Appendectomy. Postoperative changes in the distal colon. Residual colon is unremarkable. Vascular/Lymphatic: Vascular structures are unremarkable. No pathologically enlarged lymph nodes. Reproductive: Prostate is visualized. Other: No free fluid.  Mesenteries and peritoneum are unremarkable. Musculoskeletal: Bilateral L5 pars defects without alignment abnormality. Probable bone island in the right femoral head, unchanged. No worrisome lytic or sclerotic lesions. IMPRESSION: No evidence of recurrent or metastatic disease. Electronically Signed   By: Lorin Picket M.D.   On: 01/02/2020 09:23     ASSESSMENT & PLAN:  Andres Wallace is a 39 y.o. male with   1. Left colon cancer,sigmoid colon,  invasive adenocarcinoma, G3, pT3N2M0, stage IIIB, MSI stable -He was diagnosed in 09/2016. He is s/pcomplete surgical resection with negative marginsand adjuvant CAPOX.  -Wepreviouslydiscussed cancer surveillance. Risk of recurrence reduces significantly after the first 2-3 years. Will continue to follow him for a total of 5 years with follow ups, labs and physical exam.  surveillanceColonoscopy done on 09/30/2017 was negative for malignancy. -I personally reviewed and discussed his CT CAP from 01/02/20 which showed no evidence of recurrence or other concerns. -It has been more than 3 years since his initial diagnosis, his risk of recurrence is much lower now. I do not plan to repeat surveillance CT scans, unless there are some clinical concerns. -Lab and follow-up in 6 months. -He did not have lab done with his CT scan this time, will ask his primary care physician to obtain CBC, CMP and CEA by the visit with him next month. Lab and follow-up in 6 months   2. History of seizure  -Continue Lamictal and a follow-up with his neurologist at [facility]. -His seizure episodes were 4 years ago (2016). No repeat episodes since then. He and his neurologist are weaning him off Lamictal.    PLAN: -CT scan reviewed, and ED -will ask his primary care physician Dr. Humphrey Rolls to include CBC, CMP and CEA in his lab next month -lab and follow-up in 6 months   No problem-specific Assessment & Plan notes found for this encounter.   No orders of the defined types were placed in this encounter.  I discussed the assessment and treatment plan with the patient. The patient was provided an opportunity to ask questions and all were answered. The patient agreed with the plan and demonstrated an understanding of the instructions.  The patient was advised to call back or seek an in-person evaluation if the symptoms worsen or if the condition fails to improve as anticipated.  The total time spent in the appointment  was 30 minutes.    Truitt Merle, MD 01/03/2020   I, Joslyn Devon, am acting as scribe for Truitt Merle, MD.   I have reviewed the above documentation for accuracy and completeness, and I agree with the above.

## 2020-01-02 NOTE — Telephone Encounter (Signed)
Scheduled appt per 11/3 sch msg - pt is aware of apt date and time

## 2020-01-03 ENCOUNTER — Encounter: Payer: Self-pay | Admitting: Hematology

## 2020-01-03 ENCOUNTER — Inpatient Hospital Stay: Payer: No Typology Code available for payment source | Attending: Hematology | Admitting: Hematology

## 2020-01-03 DIAGNOSIS — C187 Malignant neoplasm of sigmoid colon: Secondary | ICD-10-CM

## 2020-01-03 DIAGNOSIS — C772 Secondary and unspecified malignant neoplasm of intra-abdominal lymph nodes: Secondary | ICD-10-CM | POA: Diagnosis not present

## 2020-08-29 ENCOUNTER — Encounter: Payer: Self-pay | Admitting: Hematology

## 2020-12-14 IMAGING — CT CT CHEST W/ CM
3 of 5 series · 16 of 36 positions shown, 18 images · IV contrast (OMNIPAQUE)
Comparison: 11/01/2018.

CLINICAL DATA: Colorectal cancer.

EXAM:
CT CHEST, ABDOMEN, AND PELVIS WITH CONTRAST
TECHNIQUE: Multidetector CT imaging of the chest, abdomen and pelvis was
performed following the standard protocol during bolus
administration of intravenous contrast.
CONTRAST:  100mL OMNIPAQUE IOHEXOL 300 MG/ML  SOLN

[Series 2: cap with · axial · 0.80mm/px · z∈[+988,+1513]mm · 8 of 137 slices shown, 10 images]
[im 16/137  mediastinal]
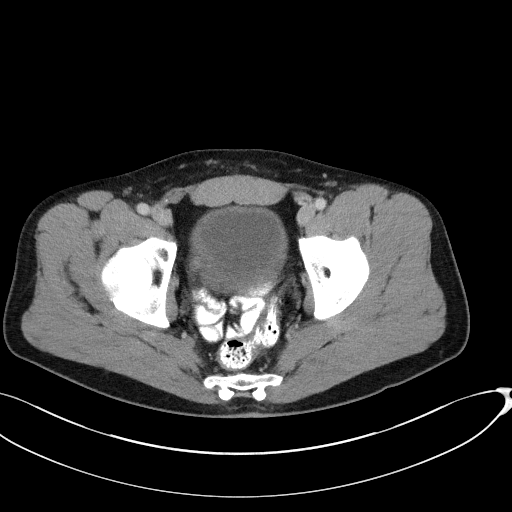
[im 16/137  lung]
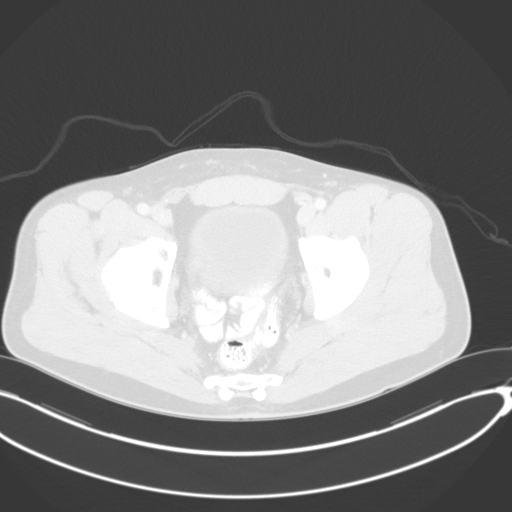
[im 31/137  lung]
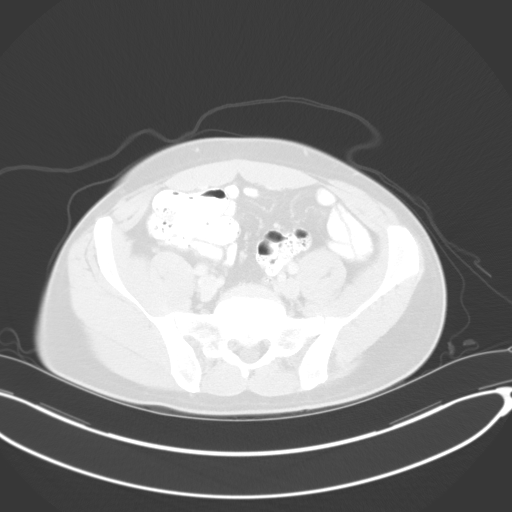
[im 46/137  lung]
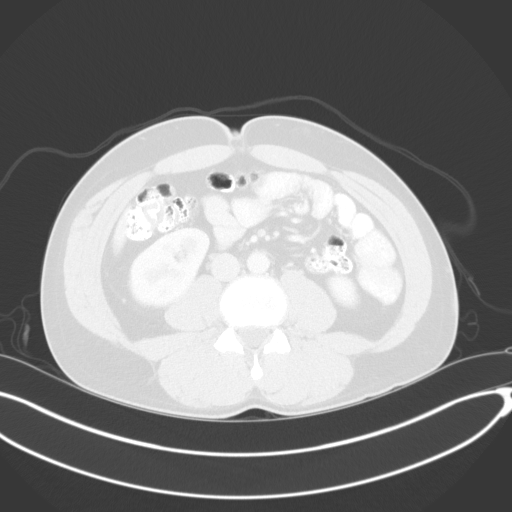
[im 61/137  lung]
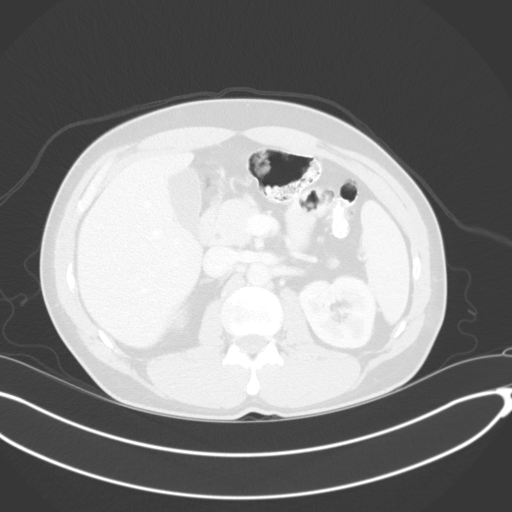
[im 76/137  mediastinal]
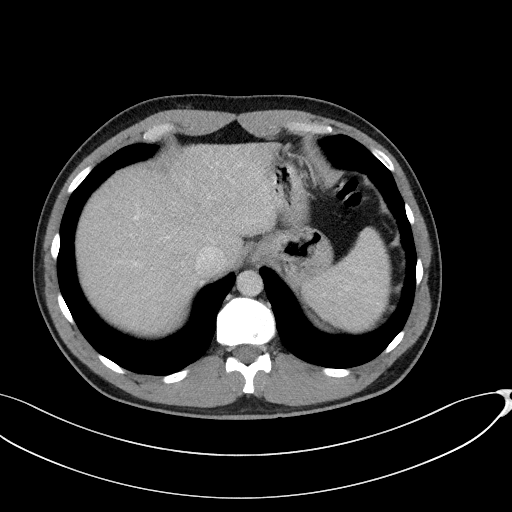
[im 76/137  lung]
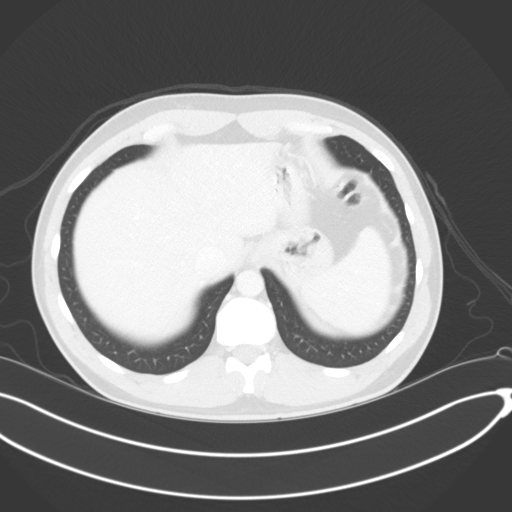
[im 91/137  lung]
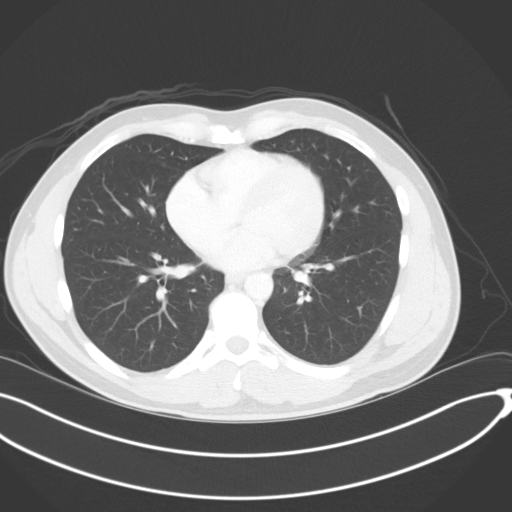
[im 106/137  lung]
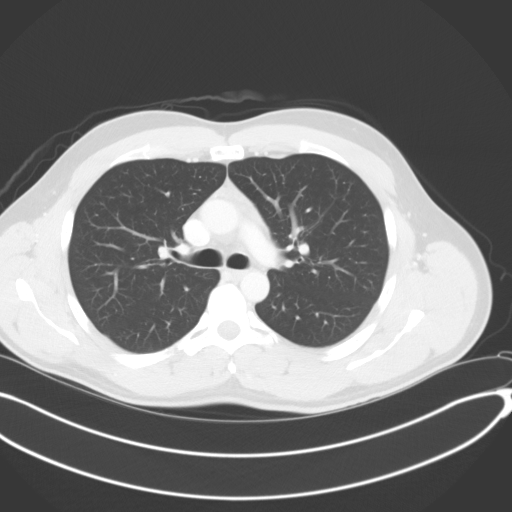
[im 121/137  lung]
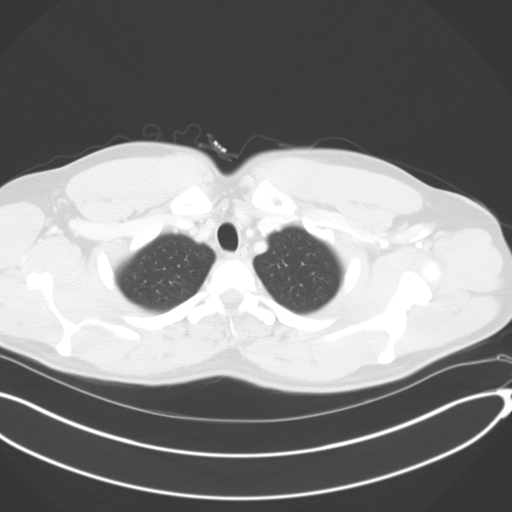

[Series 5: coronals · coronal · 1.00mm/px · 3 of 141 slices shown]
[im 29/141  lung]
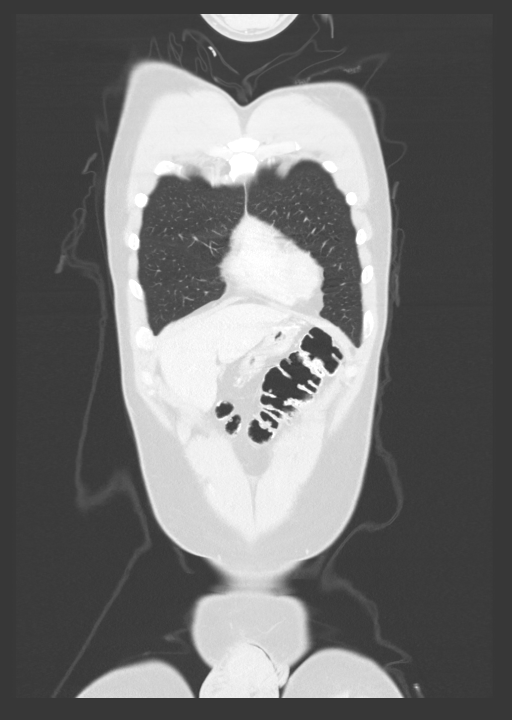
[im 57/141  lung]
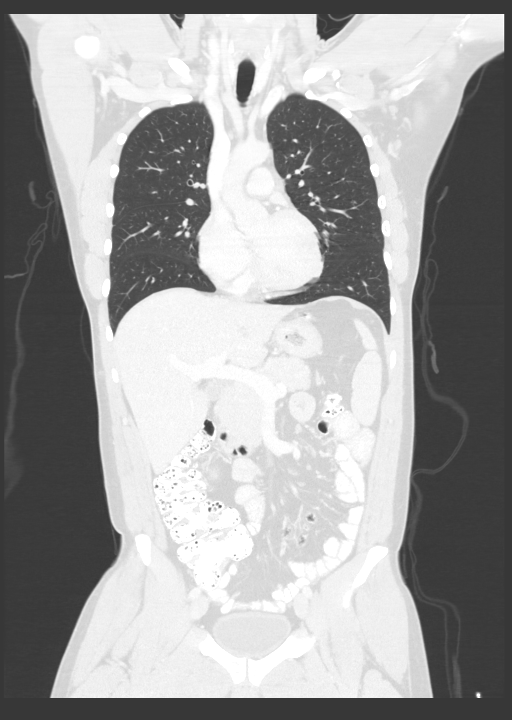
[im 85/141  lung]
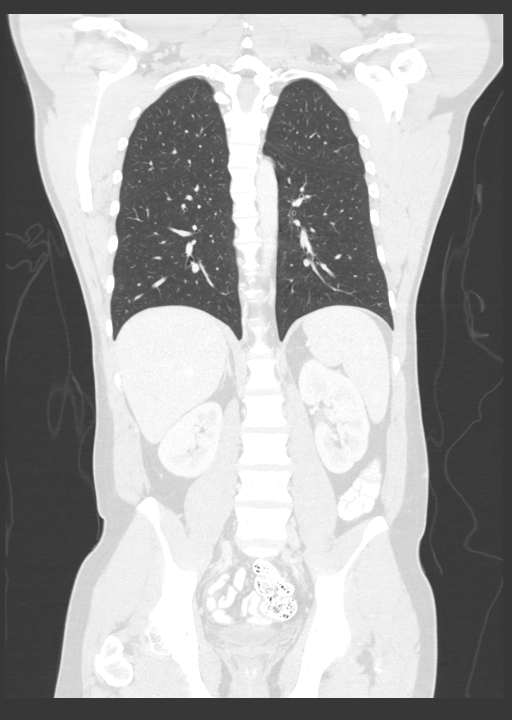

[Series 7: lung · axial · 0.80mm/px · z∈[+1255,+1437]mm · 5 of 182 slices shown]
[im 13/182  lung]
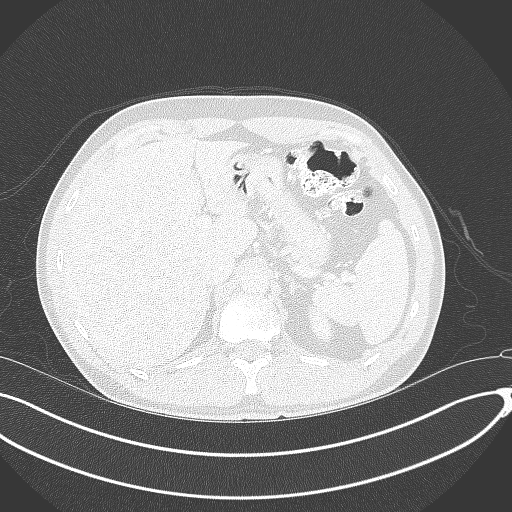
[im 39/182  lung]
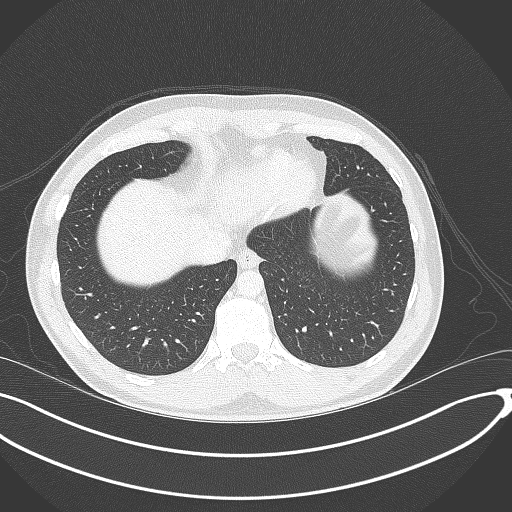
[im 65/182  lung]
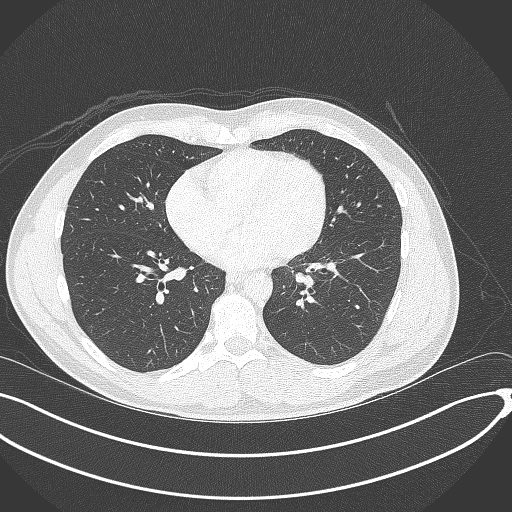
[im 78/182  lung]
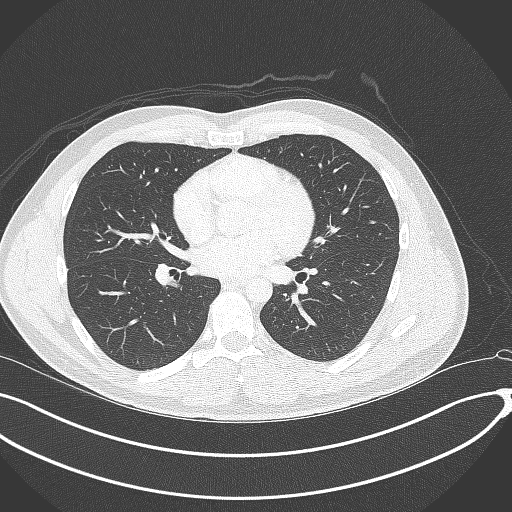
[im 104/182  lung]
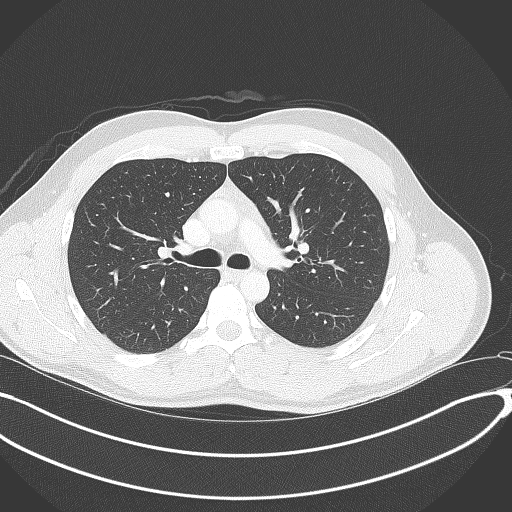

[16 of 36 positions shown; findings below may reference images not displayed]

FINDINGS: CT CHEST FINDINGS

Cardiovascular: Heart size normal. No pericardial or pleural
effusion.

Mediastinum/Nodes: Triangular-shaped soft tissue in the prevascular
space is indicative of thymus, unchanged. No pathologically enlarged
mediastinal, hilar or axillary lymph nodes. Esophagus is
unremarkable.

Lungs/Pleura: Lungs are clear. No pleural fluid. Airway is
unremarkable.

Musculoskeletal: No worrisome lytic or sclerotic lesions.

CT ABDOMEN PELVIS FINDINGS

Hepatobiliary: Liver and gallbladder are unremarkable. No biliary
ductal dilatation.

Pancreas: Negative.

Spleen: Negative.

Adrenals/Urinary Tract: Adrenal glands and kidneys are unremarkable.
Ureters are decompressed. Bladder is unremarkable.

Stomach/Bowel: Stomach and small bowel are unremarkable.
Appendectomy. Postoperative changes in the distal colon. Residual
colon is unremarkable.

Vascular/Lymphatic: Vascular structures are unremarkable. No
pathologically enlarged lymph nodes.

Reproductive: Prostate is visualized.

Other: No free fluid.  Mesenteries and peritoneum are unremarkable.

Musculoskeletal: Bilateral L5 pars defects without alignment
abnormality. Probable bone island in the right femoral head,
unchanged. No worrisome lytic or sclerotic lesions.
IMPRESSION: No evidence of recurrent or metastatic disease.

## 2020-12-19 ENCOUNTER — Encounter: Payer: Self-pay | Admitting: Genetic Counselor

## 2020-12-19 NOTE — Progress Notes (Signed)
UPDATE: DICER1 c.3677A>C (p.Glu1226Ala) VUS has been reclassified to Likely Benign.  The amended report date is December 11, 2020.

## 2021-12-25 ENCOUNTER — Other Ambulatory Visit: Payer: Self-pay

## 2021-12-25 ENCOUNTER — Other Ambulatory Visit: Payer: Self-pay | Admitting: Hematology

## 2021-12-25 DIAGNOSIS — C187 Malignant neoplasm of sigmoid colon: Secondary | ICD-10-CM
